# Patient Record
Sex: Female | Born: 1970 | Race: White | Hispanic: No | Marital: Married | Smoking: Never smoker
Health system: Southern US, Community
[De-identification: ages and names within clinical notes are randomized; demographics above are authoritative.]

## PROBLEM LIST (undated history)

## (undated) DIAGNOSIS — F419 Anxiety disorder, unspecified: Secondary | ICD-10-CM

## (undated) DIAGNOSIS — T8859XA Other complications of anesthesia, initial encounter: Secondary | ICD-10-CM

## (undated) DIAGNOSIS — R011 Cardiac murmur, unspecified: Secondary | ICD-10-CM

## (undated) DIAGNOSIS — Z9889 Other specified postprocedural states: Secondary | ICD-10-CM

## (undated) DIAGNOSIS — I739 Peripheral vascular disease, unspecified: Secondary | ICD-10-CM

## (undated) DIAGNOSIS — T4145XA Adverse effect of unspecified anesthetic, initial encounter: Secondary | ICD-10-CM

## (undated) DIAGNOSIS — M79676 Pain in unspecified toe(s): Secondary | ICD-10-CM

## (undated) DIAGNOSIS — Z87442 Personal history of urinary calculi: Secondary | ICD-10-CM

## (undated) DIAGNOSIS — E669 Obesity, unspecified: Secondary | ICD-10-CM

## (undated) DIAGNOSIS — Z9884 Bariatric surgery status: Secondary | ICD-10-CM

## (undated) DIAGNOSIS — N200 Calculus of kidney: Secondary | ICD-10-CM

## (undated) DIAGNOSIS — Z8719 Personal history of other diseases of the digestive system: Secondary | ICD-10-CM

## (undated) DIAGNOSIS — R112 Nausea with vomiting, unspecified: Secondary | ICD-10-CM

## (undated) HISTORY — PX: EYE SURGERY: SHX253

## (undated) HISTORY — PX: VASCULAR SURGERY: SHX849

## (undated) HISTORY — DX: Obesity, unspecified: E66.9

---

## 2007-10-09 HISTORY — PX: ACHILLES TENDON REPAIR: SUR1153

## 2009-03-10 HISTORY — PX: CHOLECYSTECTOMY: SHX55

## 2010-07-29 ENCOUNTER — Encounter (HOSPITAL_BASED_OUTPATIENT_CLINIC_OR_DEPARTMENT_OTHER): Attending: Internal Medicine

## 2010-07-29 DIAGNOSIS — S81009A Unspecified open wound, unspecified knee, initial encounter: Secondary | ICD-10-CM | POA: Insufficient documentation

## 2010-07-29 DIAGNOSIS — T310 Burns involving less than 10% of body surface: Secondary | ICD-10-CM | POA: Insufficient documentation

## 2010-07-29 DIAGNOSIS — Z7982 Long term (current) use of aspirin: Secondary | ICD-10-CM | POA: Insufficient documentation

## 2010-07-29 DIAGNOSIS — IMO0002 Reserved for concepts with insufficient information to code with codable children: Secondary | ICD-10-CM | POA: Insufficient documentation

## 2010-07-29 DIAGNOSIS — S91009A Unspecified open wound, unspecified ankle, initial encounter: Secondary | ICD-10-CM | POA: Insufficient documentation

## 2010-07-29 DIAGNOSIS — T24009A Burn of unspecified degree of unspecified site of unspecified lower limb, except ankle and foot, initial encounter: Secondary | ICD-10-CM | POA: Insufficient documentation
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
## 2010-08-09 ENCOUNTER — Encounter (HOSPITAL_BASED_OUTPATIENT_CLINIC_OR_DEPARTMENT_OTHER): Attending: Internal Medicine

## 2010-08-09 DIAGNOSIS — IMO0002 Reserved for concepts with insufficient information to code with codable children: Secondary | ICD-10-CM | POA: Insufficient documentation

## 2010-08-09 DIAGNOSIS — T310 Burns involving less than 10% of body surface: Secondary | ICD-10-CM | POA: Insufficient documentation

## 2010-08-09 DIAGNOSIS — T24339A Burn of third degree of unspecified lower leg, initial encounter: Secondary | ICD-10-CM | POA: Insufficient documentation

## 2010-08-09 DIAGNOSIS — Z7982 Long term (current) use of aspirin: Secondary | ICD-10-CM | POA: Insufficient documentation

## 2011-04-30 ENCOUNTER — Encounter (INDEPENDENT_AMBULATORY_CARE_PROVIDER_SITE_OTHER): Payer: Self-pay | Admitting: General Surgery

## 2011-05-01 ENCOUNTER — Encounter (INDEPENDENT_AMBULATORY_CARE_PROVIDER_SITE_OTHER): Payer: Self-pay | Admitting: Surgery

## 2011-05-01 ENCOUNTER — Ambulatory Visit (INDEPENDENT_AMBULATORY_CARE_PROVIDER_SITE_OTHER): Payer: 59 | Admitting: Surgery

## 2011-05-01 ENCOUNTER — Other Ambulatory Visit (INDEPENDENT_AMBULATORY_CARE_PROVIDER_SITE_OTHER): Payer: Self-pay | Admitting: General Surgery

## 2011-05-01 VITALS — BP 138/92 | HR 76 | Temp 98.6°F | Resp 16 | Ht 67.0 in | Wt 262.4 lb

## 2011-05-01 DIAGNOSIS — E669 Obesity, unspecified: Secondary | ICD-10-CM

## 2011-05-01 NOTE — Progress Notes (Signed)
Chief Complaint:  Morbid obesity BMI 41  History of Present Illness:  Darlene Patterson is an 41 y.o. female who works here at the [employer] and it comes today with her husband who is a critical care nurse at [employer] wanting to have a left adjustable gastric band placed. They have been to our seminar and have reviewed this and like the less invasive nature of the band and are aware that as a tool. We talked about dietary restrictions and maintaining these  She is scheduled to have a hysterectomy laparoscopically in March. She was hoping that she might be able to have a LAP-BAND in June. We'll begin the workup now.  History reviewed. No pertinent past medical history.  Past Surgical History  Procedure Date  . Cholecystectomy 2011  . Achilles tendon repair 08, 09    bilateral  . Cesarean section 89, 93    Current Outpatient Prescriptions  Medication Sig Dispense Refill  . aspirin 81 MG tablet Take 81 mg by mouth daily.      Marland Kitchen b complex vitamins tablet Take 1 tablet by mouth daily.      Marland Kitchen MAGNESIUM PO Take by mouth.      . Multiple Vitamin (MULITIVITAMIN WITH MINERALS) TABS Take 1 tablet by mouth daily.      . vitamin B-12 (CYANOCOBALAMIN) 1000 MCG tablet Take 1,000 mcg by mouth daily.       Review of patient's allergies indicates no known allergies. Family History  Problem Relation Age of Onset  . Hypertension Sister    Social History:   reports that she has never smoked. She does not have any smokeless tobacco history on file. She reports that she does not drink alcohol or use illicit drugs.   REVIEW OF SYSTEMS - PERTINENT POSITIVES ONLY: Is negative for DVT. She has had some spider veins injected. Same bilateral Achilles heel repairs. She has a negative family cancer history. Her social history is that she didn't drink or smoke. Her primary care physician is Dr. Jeanie Sewer.    Physical Exam:   Blood pressure 138/92, pulse 76, temperature 98.6 F (37 C),  temperature source Temporal, resp. rate 16, height 5\' 7"  (1.702 m), weight 262 lb 6.4 oz (119.024 kg). Body mass index is 41.10 kg/(m^2).  Gen:  WDWN white female NAD  Neurological: Alert and oriented to person, place, and time. Motor and sensory function is grossly intact  Head: Normocephalic and atraumatic.  Eyes: Conjunctivae are normal. Pupils are equal, round, and reactive to light. No scleral icterus.  Neck: Normal range of motion. Neck supple. No tracheal deviation or thyromegaly present.  Cardiovascular:  SR without murmurs or gallops.  No carotid bruits Respiratory: Effort normal.  No respiratory distress. No chest wall tenderness. Breath sounds normal.  No wheezes, rales or rhonchi.  Abdomen:  Obese nontender I went over location port placement and trocar placement. GU: Musculoskeletal: Normal range of motion. Extremities are nontender. No cyanosis, edema or clubbing noted Lymphadenopathy: No cervical, preauricular, postauricular or axillary adenopathy is present Skin: Skin is warm and dry. No rash noted. No diaphoresis. No erythema. No pallor. Pscyh: Normal mood and affect. Behavior is normal. Judgment and thought content normal.   LABORATORY RESULTS: No results found for this or any previous visit (from the past 48 hour(s)).  RADIOLOGY RESULTS: No results found.  Problem List: There is no problem list on file for this patient.   Assessment & Plan: Morbid obesity BMI 41 plan lap band.  We'll begin the workup    Matt B. Daphine Deutscher, MD, [facility] [phone] beeper [phone]  05/01/2011 11:56 AM

## 2011-05-01 NOTE — Progress Notes (Signed)
Addended by: Latricia Heft on: 05/01/2011 12:36 PM   Modules accepted: Orders

## 2011-05-09 HISTORY — PX: ABDOMINAL HYSTERECTOMY: SHX81

## 2011-05-16 ENCOUNTER — Other Ambulatory Visit (INDEPENDENT_AMBULATORY_CARE_PROVIDER_SITE_OTHER): Payer: Self-pay | Admitting: Surgery

## 2011-05-16 ENCOUNTER — Ambulatory Visit (HOSPITAL_COMMUNITY)
Admission: RE | Admit: 2011-05-16 | Discharge: 2011-05-16 | Disposition: A | Discharge status: Home / Self Care | Payer: 59 | Source: Ambulatory Visit | Attending: Surgery | Admitting: Surgery

## 2011-05-16 ENCOUNTER — Other Ambulatory Visit: Payer: Self-pay

## 2011-05-16 DIAGNOSIS — Z6841 Body Mass Index (BMI) 40.0 and over, adult: Secondary | ICD-10-CM | POA: Insufficient documentation

## 2011-05-16 DIAGNOSIS — K449 Diaphragmatic hernia without obstruction or gangrene: Secondary | ICD-10-CM | POA: Insufficient documentation

## 2011-05-16 DIAGNOSIS — Z01818 Encounter for other preprocedural examination: Secondary | ICD-10-CM | POA: Insufficient documentation

## 2011-05-16 DIAGNOSIS — E669 Obesity, unspecified: Secondary | ICD-10-CM

## 2011-05-17 LAB — LIPID PANEL
Cholesterol: 166 mg/dL (ref 0–200)
HDL: 47 mg/dL (ref >39)
Total CHOL/HDL Ratio: 3.5 Ratio

## 2011-05-17 LAB — CBC
HCT: 43.3 % (ref 36.0–46.0)
Hemoglobin: 13.8 g/dL (ref 12.0–15.0)
MCH: 28 pg (ref 26.0–34.0)
MCHC: 31.9 g/dL (ref 30.0–36.0)
RBC: 4.93 MIL/uL (ref 3.87–5.11)

## 2011-05-17 LAB — COMPREHENSIVE METABOLIC PANEL
ALT: 14 U/L (ref 0–35)
BUN: 10 mg/dL (ref 6–23)
CO2: 21 mEq/L (ref 19–32)
Calcium: 9.5 mg/dL (ref 8.4–10.5)
Chloride: 105 mEq/L (ref 96–112)
Creat: 0.68 mg/dL (ref 0.50–1.10)
Glucose, Bld: 107 mg/dL — ABNORMAL HIGH (ref 70–99)

## 2011-05-17 LAB — TSH: TSH: 2.132 u[IU]/mL (ref 0.350–4.500)

## 2011-05-19 ENCOUNTER — Encounter: Payer: 59 | Attending: Surgery | Admitting: *Deleted

## 2011-05-19 ENCOUNTER — Encounter: Payer: Self-pay | Admitting: *Deleted

## 2011-05-19 DIAGNOSIS — Z01818 Encounter for other preprocedural examination: Secondary | ICD-10-CM | POA: Insufficient documentation

## 2011-05-19 DIAGNOSIS — Z713 Dietary counseling and surveillance: Secondary | ICD-10-CM | POA: Insufficient documentation

## 2011-05-19 NOTE — Patient Instructions (Signed)
   Follow Pre-Op Nutrition Goals to prepare for Lapband Surgery.   Call the [facility] at [phone] once you have been given your surgery date to enrolled in the Pre-Op Nutrition Class. You will need to attend this nutrition class 3-4 weeks prior to your surgery. 

## 2011-05-20 NOTE — Progress Notes (Addendum)
  Pre-Op Assessment Visit:  Pre-Operative LAGB Surgery  Medical Nutrition Therapy:  Appt start time: 0915  end time:  1015.  Patient was seen on 05/19/11 for Pre-Operative LAGB Nutrition Assessment. Assessment and letter of approval faxed to [facility] B[facility]ric Surgery Program coordinator on 05/20/11.  Approval letter sent to [facility] center and will be available in the chart under the media tab.  TANITA  BODY COMP RESULTS  05/19/11     %Fat 51.0%     FM (lbs) 131.5     FFM (lbs) 126.5     TBW (lbs) 92.5      Handouts given during visit include:  Pre-Op Goals handout  Bariatric Protein Shakes handout  Bariatric Support Group calendar  Patient to call for Pre-Op and Post-Op Nutrition Education at the [facility] when surgery is scheduled.

## 2011-05-21 ENCOUNTER — Encounter (HOSPITAL_COMMUNITY)
Admission: RE | Disposition: A | Discharge status: Home Health | Payer: Self-pay | Source: Ambulatory Visit | Attending: Surgery

## 2011-05-21 ENCOUNTER — Ambulatory Visit (HOSPITAL_COMMUNITY)
Admission: RE | Admit: 2011-05-21 | Discharge: 2011-05-21 | Disposition: A | Discharge status: Home Health | Payer: 59 | Source: Ambulatory Visit | Attending: Surgery | Admitting: Surgery

## 2011-05-21 DIAGNOSIS — Z01818 Encounter for other preprocedural examination: Secondary | ICD-10-CM | POA: Insufficient documentation

## 2011-05-21 HISTORY — PX: BREATH TEK H PYLORI: SHX5422

## 2011-05-21 SURGERY — BREATH TEST, FOR HELICOBACTER PYLORI

## 2011-05-22 ENCOUNTER — Encounter (HOSPITAL_COMMUNITY): Payer: Self-pay | Admitting: Surgery

## 2011-09-25 ENCOUNTER — Encounter: Payer: 59 | Attending: Surgery | Admitting: *Deleted

## 2011-09-25 DIAGNOSIS — Z01818 Encounter for other preprocedural examination: Secondary | ICD-10-CM | POA: Insufficient documentation

## 2011-09-25 DIAGNOSIS — Z713 Dietary counseling and surveillance: Secondary | ICD-10-CM | POA: Insufficient documentation

## 2011-09-25 DIAGNOSIS — E669 Obesity, unspecified: Secondary | ICD-10-CM

## 2011-09-25 NOTE — Progress Notes (Signed)
Dr Daphine Deutscher,   Please add pre op orders to [facility]-   Has PST appt 10/01/11   Thanks

## 2011-09-27 NOTE — Progress Notes (Addendum)
  Bariatric Class:  Appt start time: 0830 end time:  0930.  Pre-Operative Nutrition Class  Patient was seen on 09/25/11 for Pre-Operative Bariatric Surgery Education at [facility].   Surgery date: 10/07/11 Surgery type: LAGB  Samples given per MNT protocol: Bariatric Advantage Multivitamin Lot # [id number] Exp: 09/13  Bariatric Advantage Calcium Citrate Lot # [id number] Exp: 10/13  Celebrate Vitamins Multivitamin Complete Lot # [id number] Exp: 11/14  Celebrate Vitamins Multivitamin Lot # [id number] Exp: 09/14  Celebrate VitaminsCalcium Citrate Lot # [id number] Exp: 10/14 The following the learning objective met by the patient during this course:   Identifies Pre-Op Dietary Goals and will begin 2 weeks pre-operatively   Identifies appropriate sources of fluids and proteins   States protein recommendations and appropriate sources pre and post-operatively  Identifies Post-Operative Dietary Goals and will follow for 2 weeks post-operatively  Identifies appropriate multivitamin and calcium sources  Describes the need for physical activity post-operatively and will follow MD recommendations  States when to call healthcare provider regarding medication questions or post-operative complications  Handouts given during class include:  Pre-Op Bariatric Surgery Diet Handout  Protein Shake Handout  Post-Op Bariatric Surgery Nutrition Handout  BELT Program Information Flyer  Support Group Information Flyer  Follow-Up Plan: Patient will follow-up at [facility] 2 weeks post operatively for diet advancement per MD.

## 2011-09-27 NOTE — Patient Instructions (Signed)
Follow:   Pre-Op Diet per MD 2 weeks prior to surgery  Phase 2- Liquids (clear/full) 2 weeks after surgery  Vitamin/Mineral/Calcium guidelines for purchasing bariatric supplements  Exercise guidelines pre and post-op per MD  Follow-up at [facility] in 2 weeks post-op for diet advancement. Contact Amous Crewe as needed with questions/concerns. 

## 2011-09-29 ENCOUNTER — Encounter (HOSPITAL_COMMUNITY): Payer: Self-pay | Admitting: Pharmacy Technician

## 2011-09-29 NOTE — Pre-Procedure Instructions (Signed)
Need surgery orders for pt pre op is 7-24 thanks

## 2011-09-30 ENCOUNTER — Other Ambulatory Visit (INDEPENDENT_AMBULATORY_CARE_PROVIDER_SITE_OTHER): Payer: Self-pay | Admitting: Surgery

## 2011-10-01 ENCOUNTER — Encounter (HOSPITAL_COMMUNITY): Payer: Self-pay

## 2011-10-01 ENCOUNTER — Other Ambulatory Visit (INDEPENDENT_AMBULATORY_CARE_PROVIDER_SITE_OTHER): Payer: Self-pay | Admitting: Surgery

## 2011-10-01 ENCOUNTER — Encounter (HOSPITAL_COMMUNITY)
Admission: RE | Admit: 2011-10-01 | Discharge: 2011-10-01 | Disposition: A | Discharge status: Home / Self Care | Payer: 59 | Source: Ambulatory Visit | Attending: Surgery | Admitting: Surgery

## 2011-10-01 HISTORY — DX: Anxiety disorder, unspecified: F41.9

## 2011-10-01 HISTORY — DX: Adverse effect of unspecified anesthetic, initial encounter: T41.45XA

## 2011-10-01 HISTORY — DX: Nausea with vomiting, unspecified: Z98.890

## 2011-10-01 HISTORY — DX: Cardiac murmur, unspecified: R01.1

## 2011-10-01 HISTORY — DX: Other complications of anesthesia, initial encounter: T88.59XA

## 2011-10-01 HISTORY — DX: Peripheral vascular disease, unspecified: I73.9

## 2011-10-01 HISTORY — DX: Personal history of other diseases of the digestive system: Z87.19

## 2011-10-01 HISTORY — DX: Other specified postprocedural states: R11.2

## 2011-10-01 LAB — COMPREHENSIVE METABOLIC PANEL
AST: 19 U/L (ref 0–37)
Albumin: 3.8 g/dL (ref 3.5–5.2)
Calcium: 9.9 mg/dL (ref 8.4–10.5)
Creatinine, Ser: 0.65 mg/dL (ref 0.50–1.10)
GFR calc non Af Amer: >90 mL/min (ref >90)

## 2011-10-01 LAB — CBC
HCT: 41.3 % (ref 36.0–46.0)
Hemoglobin: 13.9 g/dL (ref 12.0–15.0)
RDW: 13.2 % (ref 11.5–15.5)
WBC: 9.5 10*3/uL (ref 4.0–10.5)

## 2011-10-01 LAB — SURGICAL PCR SCREEN
MRSA, PCR: NEGATIVE
Staphylococcus aureus: POSITIVE — AB

## 2011-10-01 NOTE — Patient Instructions (Signed)
20 Darlene Patterson  10/01/2011   Your procedure is scheduled on:  10/07/11   Tuesday  Surgery  [id number]  Report to [facility] at   0805    AM.  Call this number if you have problems the morning of surgery: [phone]     Or PST   [phone]  [facility]   Remember:   Do not eat food: or fluids After Midnight. Monday NIGHT     Take these medicines the morning of surgery with A SIP OF WATER: none   Do not wear jewelry, make-up or nail polish.  Do not wear lotions, powders, or perfumes. You may wear deodorant.  Do not shave 48 hours prior to surgery.  Do not bring valuables to the hospital.  Contacts, dentures or bridgework may not be worn into surgery.  Leave suitcase in the car. After surgery it may be brought to your room.  For patients admitted to the hospital, checkout time is 11:00 AM the day of discharge.   Patients discharged the day of surgery will not be allowed to drive home.  Name and phone number of your driver:                                                                      Special Instructions: CHG Shower Use Special Wash: 1/2 bottle night before surgery and 1/2 bottle morning of surgery. REGULAR SOAP FACE AND PRIVATES              LADIES- NO SHAVING 48 HOURS BEFORE USING BETASEPT SOAP.                 Please read over the following fact sheets that you were given: MRSA Information

## 2011-10-01 NOTE — Pre-Procedure Instructions (Signed)
ANESTHESIA RECORD FROM 3/13 [facility] H[facility]ITAL PLACED ON CHART

## 2011-10-01 NOTE — Pre-Procedure Instructions (Signed)
EKG,CHEST 3/13  EPIC,  Requested anesthesia records from [facility] from 3/13- HYSTERECTOMY

## 2011-10-02 ENCOUNTER — Encounter (INDEPENDENT_AMBULATORY_CARE_PROVIDER_SITE_OTHER): Payer: Self-pay | Admitting: Surgery

## 2011-10-02 ENCOUNTER — Ambulatory Visit (INDEPENDENT_AMBULATORY_CARE_PROVIDER_SITE_OTHER): Payer: 59 | Admitting: Surgery

## 2011-10-02 VITALS — BP 110/82 | HR 62 | Temp 97.3°F | Resp 16 | Ht 67.0 in | Wt 259.0 lb

## 2011-10-02 DIAGNOSIS — E669 Obesity, unspecified: Secondary | ICD-10-CM

## 2011-10-02 NOTE — Patient Instructions (Addendum)

## 2011-10-02 NOTE — Progress Notes (Signed)
Chief Complaint:  Morbid obesity Wt 259  History of Present Illness:  Darlene Patterson is an 41 y.o. female last seen by me in February for Lapband.  That is scheduled for this next Tuesday. She denies having any symptoms of GERD but on upper GI series she was felt to have a small hiatus hernia.  Repeat questioning she maybe has occasional heartburn after eating but this is very rare. We will look at this will redo her lap band. She is well school on [employer] is rated at this next week. She denies any change in her physical exam or medical history since I saw her back in February.  She does complain of severe postoperative nausea and vomiting despite patches and IV medicines. I've asked her to speak with the anesthesiologist in the holding area so that we can do all that we can to try to help prevent that. She said that occur after her hysterectomy back in 2011 which was done down in [facility].  Past Medical History  Diagnosis Date  . Obesity   . Complication of anesthesia   . PONV (postoperative nausea and vomiting)     'SEVERE PER PT"  . Heart murmur     early teens- resolved  . Anxiety     with anesthesia  . H/O hiatal hernia     per upper GI 3/13  . Peripheral vascular disease     varicose veins- left    Past Surgical History  Procedure Date  . Cholecystectomy 2011  . Achilles tendon repair 08, 09    bilateral  . Cesarean section 89, 93  . Breath tek h pylori 05/21/2011    Procedure: BREATH TEK H PYLORI;  Surgeon: Valarie Merino, MD;  Location: [facility];  Service: General;  Laterality: N/A;  . Abdominal hysterectomy 3/13  . Eye surgery 73,85    repair lazy eye  . Vascular surgery     left leg- s/p  injections of veins    Current Outpatient Prescriptions  Medication Sig Dispense Refill  . aspirin 81 MG tablet Take 81 mg by mouth daily.      Marland Kitchen b complex vitamins tablet Take 1 tablet by mouth daily.      Marland Kitchen MAGNESIUM PO Take 800 mg by mouth daily.       . Multiple  Vitamins-Minerals (MULTIVITAMIN GUMMIES ADULT) CHEW Chew 1 tablet by mouth daily.       Review of patient's allergies indicates no known allergies. Family History  Problem Relation Age of Onset  . Hypertension Sister   . COPD Mother    Social History:   reports that she has never smoked. She has never used smokeless tobacco. She reports that she drinks alcohol. She reports that she does not use illicit drugs.   REVIEW OF SYSTEMS - PERTINENT POSITIVES ONLY: noncontributory  Physical Exam:   Blood pressure 110/82, pulse 62, temperature 97.3 F (36.3 C), temperature source Temporal, resp. rate 16, height 5\' 7"  (1.702 m), weight 259 lb (117.482 kg), last menstrual period 06/01/2011. Body mass index is 40.57 kg/(m^2).  Gen:  WDWN WF NAD  Neurological: Alert and oriented to person, place, and time. Motor and sensory function is grossly intact  Head: Normocephalic and atraumatic.  Eyes: Conjunctivae are normal. Pupils are equal, round, and reactive to light. No scleral icterus.  Neck: Normal range of motion. Neck supple. No tracheal deviation or thyromegaly present.  Cardiovascular:  SR without murmurs or gallops.  No carotid bruits Respiratory:  Effort normal.  No respiratory distress. No chest wall tenderness. Breath sounds normal.  No wheezes, rales or rhonchi.  Abdomen:  nontender GU: Musculoskeletal: Normal range of motion. Extremities are nontender. No cyanosis, edema or clubbing noted Lymphadenopathy: No cervical, preauricular, postauricular or axillary adenopathy is present Skin: Skin is warm and dry. No rash noted. No diaphoresis. No erythema. No pallor. Pscyh: Normal mood and affect. Behavior is normal. Judgment and thought content normal.   LABORATORY RESULTS: Results for orders placed during the hospital encounter of 10/01/11 (from the past 48 hour(s))  SURGICAL PCR SCREEN     Status: Abnormal   Collection Time   10/01/11 11:19 AM      Component Value Range Comment   MRSA,  PCR NEGATIVE  NEGATIVE    Staphylococcus aureus POSITIVE (*) NEGATIVE   COMPREHENSIVE METABOLIC PANEL     Status: Normal   Collection Time   10/01/11 12:15 PM      Component Value Range Comment   Sodium 139  135 - 145 mEq/L    Potassium 3.9  3.5 - 5.1 mEq/L    Chloride 102  96 - 112 mEq/L    CO2 27  19 - 32 mEq/L    Glucose, Bld 98  70 - 99 mg/dL    BUN 16  6 - 23 mg/dL    Creatinine, Ser 2.95  0.50 - 1.10 mg/dL    Calcium 9.9  8.4 - 62.1 mg/dL    Total Protein 7.7  6.0 - 8.3 g/dL    Albumin 3.8  3.5 - 5.2 g/dL    AST 19  0 - 37 U/L    ALT 27  0 - 35 U/L    Alkaline Phosphatase 84  39 - 117 U/L    Total Bilirubin 0.3  0.3 - 1.2 mg/dL    GFR calc non Af Amer >90  >90 mL/min    GFR calc Af Amer >90  >90 mL/min   CBC     Status: Abnormal   Collection Time   10/01/11 12:15 PM      Component Value Range Comment   WBC 9.5  4.0 - 10.5 K/uL    RBC 4.95  3.87 - 5.11 MIL/uL    Hemoglobin 13.9  12.0 - 15.0 g/dL    HCT 30.8  65.7 - 84.6 %    MCV 83.4  78.0 - 100.0 fL    MCH 28.1  26.0 - 34.0 pg    MCHC 33.7  30.0 - 36.0 g/dL    RDW 96.2  95.2 - 84.1 %    Platelets 404 (*) 150 - 400 K/uL     RADIOLOGY RESULTS: No results found.  Problem List: Patient Active Problem List  Diagnosis  . Obesity BMI 41    Assessment & Plan: For Lapband placement next week    Matt B. Daphine Deutscher, MD, [facility] [phone] beeper [phone]  10/02/2011 12:25 PM

## 2011-10-07 ENCOUNTER — Ambulatory Visit (HOSPITAL_COMMUNITY): Payer: 59 | Admitting: Anesthesiology

## 2011-10-07 ENCOUNTER — Encounter (HOSPITAL_COMMUNITY): Payer: Self-pay | Admitting: Anesthesiology

## 2011-10-07 ENCOUNTER — Encounter (HOSPITAL_COMMUNITY): Payer: Self-pay | Admitting: *Deleted

## 2011-10-07 ENCOUNTER — Inpatient Hospital Stay (HOSPITAL_COMMUNITY)
Admission: RE | Admit: 2011-10-07 | Discharge: 2011-10-08 | DRG: 621 | Disposition: A | Discharge status: Home / Self Care | Payer: 59 | Source: Ambulatory Visit | Attending: Surgery | Admitting: Surgery

## 2011-10-07 ENCOUNTER — Encounter (HOSPITAL_COMMUNITY)
Admission: RE | Disposition: A | Discharge status: Home / Self Care | Payer: Self-pay | Source: Ambulatory Visit | Attending: Surgery

## 2011-10-07 DIAGNOSIS — Z9884 Bariatric surgery status: Secondary | ICD-10-CM

## 2011-10-07 DIAGNOSIS — K449 Diaphragmatic hernia without obstruction or gangrene: Secondary | ICD-10-CM | POA: Diagnosis present

## 2011-10-07 DIAGNOSIS — Z6841 Body Mass Index (BMI) 40.0 and over, adult: Secondary | ICD-10-CM

## 2011-10-07 DIAGNOSIS — I8390 Asymptomatic varicose veins of unspecified lower extremity: Secondary | ICD-10-CM | POA: Diagnosis present

## 2011-10-07 DIAGNOSIS — Z01812 Encounter for preprocedural laboratory examination: Secondary | ICD-10-CM

## 2011-10-07 DIAGNOSIS — E669 Obesity, unspecified: Secondary | ICD-10-CM

## 2011-10-07 DIAGNOSIS — Z7982 Long term (current) use of aspirin: Secondary | ICD-10-CM

## 2011-10-07 HISTORY — PX: HIATAL HERNIA REPAIR: SHX195

## 2011-10-07 HISTORY — PX: LAPAROSCOPIC GASTRIC BANDING: SHX1100

## 2011-10-07 LAB — CREATININE, SERUM
Creatinine, Ser: 0.6 mg/dL (ref 0.50–1.10)
GFR calc non Af Amer: >90 mL/min (ref >90)

## 2011-10-07 LAB — CBC
MCH: 28.5 pg (ref 26.0–34.0)
MCHC: 33.7 g/dL (ref 30.0–36.0)
RDW: 13.3 % (ref 11.5–15.5)

## 2011-10-07 SURGERY — GASTRIC BANDING, LAPAROSCOPIC
Anesthesia: General | Site: Esophagus | Wound class: Clean

## 2011-10-07 MED ORDER — PROPOFOL 10 MG/ML IV EMUL
INTRAVENOUS | Status: DC | PRN
  Administered 2011-10-07: 250 ug/kg/min via INTRAVENOUS

## 2011-10-07 MED ORDER — LACTATED RINGERS IV SOLN
INTRAVENOUS | Status: DC | PRN
  Administered 2011-10-07: 1000 mL via INTRAUTERINE

## 2011-10-07 MED ORDER — MIDAZOLAM HCL 5 MG/5ML IJ SOLN
INTRAMUSCULAR | Status: DC | PRN
  Administered 2011-10-07: 2 mg via INTRAVENOUS

## 2011-10-07 MED ORDER — BUPIVACAINE-EPINEPHRINE 0.5% -1:200000 IJ SOLN
INTRAMUSCULAR | Status: AC
  Filled 2011-10-07: qty 1

## 2011-10-07 MED ORDER — SCOPOLAMINE 1 MG/3DAYS TD PT72
MEDICATED_PATCH | TRANSDERMAL | Status: DC | PRN
  Administered 2011-10-07: 1.5 mg via TRANSDERMAL

## 2011-10-07 MED ORDER — SCOPOLAMINE 1 MG/3DAYS TD PT72
MEDICATED_PATCH | TRANSDERMAL | Status: AC
  Filled 2011-10-07: qty 1

## 2011-10-07 MED ORDER — HEPARIN SODIUM (PORCINE) 5000 UNIT/ML IJ SOLN
5000.0000 [IU] | INTRAMUSCULAR | Status: AC
  Administered 2011-10-07: 5000 [IU] via SUBCUTANEOUS
  Filled 2011-10-07: qty 1

## 2011-10-07 MED ORDER — GLYCOPYRROLATE 0.2 MG/ML IJ SOLN
INTRAMUSCULAR | Status: DC | PRN
  Administered 2011-10-07: 0.6 mg via INTRAVENOUS

## 2011-10-07 MED ORDER — SODIUM CHLORIDE 0.9 % IJ SOLN
INTRAMUSCULAR | Status: DC | PRN
  Administered 2011-10-07: 20 mL

## 2011-10-07 MED ORDER — KCL IN DEXTROSE-NACL 20-5-0.45 MEQ/L-%-% IV SOLN
INTRAVENOUS | Status: DC
  Administered 2011-10-07 – 2011-10-08 (×2): via INTRAVENOUS
  Filled 2011-10-07 (×4): qty 1000

## 2011-10-07 MED ORDER — MORPHINE SULFATE 2 MG/ML IJ SOLN
2.0000 mg | INTRAMUSCULAR | Status: DC | PRN
Start: 2011-10-07 — End: 2011-10-08
  Administered 2011-10-07: 2 mg via INTRAVENOUS
  Filled 2011-10-07: qty 1

## 2011-10-07 MED ORDER — FENTANYL CITRATE 0.05 MG/ML IJ SOLN
INTRAMUSCULAR | Status: DC | PRN
  Administered 2011-10-07 (×3): 100 ug via INTRAVENOUS
  Administered 2011-10-07: 50 ug via INTRAVENOUS
  Administered 2011-10-07: 150 ug via INTRAVENOUS
  Administered 2011-10-07: 100 ug via INTRAVENOUS

## 2011-10-07 MED ORDER — OXYCODONE-ACETAMINOPHEN 5-325 MG/5ML PO SOLN: 5.0000 mL | ORAL | Status: DC | PRN

## 2011-10-07 MED ORDER — UNJURY CHOCOLATE CLASSIC POWDER: 2.0000 [oz_av] | Freq: Four times a day (QID) | ORAL | Status: DC

## 2011-10-07 MED ORDER — BUPIVACAINE LIPOSOME 1.3 % IJ SUSP
20.0000 mL | Freq: Once | INTRAMUSCULAR | Status: AC
  Administered 2011-10-07: 20 mL
  Filled 2011-10-07: qty 20

## 2011-10-07 MED ORDER — NEOSTIGMINE METHYLSULFATE 1 MG/ML IJ SOLN
INTRAMUSCULAR | Status: DC | PRN
  Administered 2011-10-07: 4 mg via INTRAVENOUS

## 2011-10-07 MED ORDER — DEXAMETHASONE SODIUM PHOSPHATE 10 MG/ML IJ SOLN
INTRAMUSCULAR | Status: DC | PRN
  Administered 2011-10-07: 10 mg via INTRAVENOUS

## 2011-10-07 MED ORDER — ACETAMINOPHEN 160 MG/5ML PO SOLN
650.0000 mg | ORAL | Status: DC | PRN
  Administered 2011-10-08: 650 mg via ORAL
  Filled 2011-10-07: qty 20.3

## 2011-10-07 MED ORDER — LACTATED RINGERS IV SOLN
INTRAVENOUS | Status: DC
  Administered 2011-10-07: 1000 mL via INTRAVENOUS
  Administered 2011-10-07: 11:00:00 via INTRAVENOUS

## 2011-10-07 MED ORDER — PROPOFOL 10 MG/ML IV BOLUS
INTRAVENOUS | Status: DC | PRN
  Administered 2011-10-07: 200 mg via INTRAVENOUS

## 2011-10-07 MED ORDER — ROCURONIUM BROMIDE 100 MG/10ML IV SOLN
INTRAVENOUS | Status: DC | PRN
  Administered 2011-10-07 (×3): 10 mg via INTRAVENOUS
  Administered 2011-10-07: 50 mg via INTRAVENOUS

## 2011-10-07 MED ORDER — LACTATED RINGERS IV SOLN: INTRAVENOUS | Status: DC

## 2011-10-07 MED ORDER — LIDOCAINE HCL (CARDIAC) 20 MG/ML IV SOLN
INTRAVENOUS | Status: DC | PRN
  Administered 2011-10-07: 50 mg via INTRAVENOUS

## 2011-10-07 MED ORDER — UNJURY CHICKEN SOUP POWDER
2.0000 [oz_av] | Freq: Four times a day (QID) | ORAL | Status: DC
  Administered 2011-10-08: 2 [oz_av] via ORAL

## 2011-10-07 MED ORDER — ONDANSETRON HCL 4 MG/2ML IJ SOLN
INTRAMUSCULAR | Status: DC | PRN
  Administered 2011-10-07: 4 mg via INTRAVENOUS

## 2011-10-07 MED ORDER — HYDROMORPHONE HCL PF 1 MG/ML IJ SOLN
INTRAMUSCULAR | Status: AC
  Filled 2011-10-07: qty 1

## 2011-10-07 MED ORDER — DROPERIDOL 2.5 MG/ML IJ SOLN
INTRAMUSCULAR | Status: DC | PRN
  Administered 2011-10-07: 0.625 mg via INTRAVENOUS

## 2011-10-07 MED ORDER — HYDROMORPHONE HCL PF 1 MG/ML IJ SOLN
0.2500 mg | INTRAMUSCULAR | Status: DC | PRN
  Administered 2011-10-07 (×2): 0.5 mg via INTRAVENOUS

## 2011-10-07 MED ORDER — ACETAMINOPHEN 10 MG/ML IV SOLN
INTRAVENOUS | Status: DC | PRN
  Administered 2011-10-07: 1000 mg via INTRAVENOUS

## 2011-10-07 MED ORDER — UNJURY VANILLA POWDER: 2.0000 [oz_av] | Freq: Four times a day (QID) | ORAL | Status: DC

## 2011-10-07 MED ORDER — HEPARIN SODIUM (PORCINE) 5000 UNIT/ML IJ SOLN
5000.0000 [IU] | Freq: Three times a day (TID) | INTRAMUSCULAR | Status: DC
  Administered 2011-10-07 – 2011-10-08 (×3): 5000 [IU] via SUBCUTANEOUS
  Filled 2011-10-07 (×6): qty 1

## 2011-10-07 MED ORDER — CEFOXITIN SODIUM-DEXTROSE 1-4 GM-% IV SOLR (PREMIX)
INTRAVENOUS | Status: AC
  Filled 2011-10-07: qty 100

## 2011-10-07 MED ORDER — DEXTROSE 5 % IV SOLN
2.0000 g | INTRAVENOUS | Status: AC
  Administered 2011-10-07: 2 g via INTRAVENOUS
  Filled 2011-10-07: qty 2

## 2011-10-07 MED ORDER — ONDANSETRON HCL 4 MG/2ML IJ SOLN: 4.0000 mg | INTRAMUSCULAR | Status: DC | PRN

## 2011-10-07 MED ORDER — ACETAMINOPHEN 10 MG/ML IV SOLN
INTRAVENOUS | Status: AC
  Filled 2011-10-07: qty 100

## 2011-10-07 SURGICAL SUPPLY — 60 items
BAND LAP 10.0 W/TUBES (Band) ×3 IMPLANT
BENZOIN TINCTURE PRP APPL 2/3 (GAUZE/BANDAGES/DRESSINGS) IMPLANT
BLADE HEX COATED 2.75 (ELECTRODE) ×3 IMPLANT
BLADE SURG 15 STRL LF DISP TIS (BLADE) ×2 IMPLANT
BLADE SURG 15 STRL SS (BLADE) ×1
CANISTER SUCTION 2500CC (MISCELLANEOUS) ×3 IMPLANT
CLOTH BEACON ORANGE TIMEOUT ST (SAFETY) ×3 IMPLANT
COVER SURGICAL LIGHT HANDLE (MISCELLANEOUS) ×3 IMPLANT
DECANTER SPIKE VIAL GLASS SM (MISCELLANEOUS) ×6 IMPLANT
DEVICE SUT QUICK LOAD TK 5 (STAPLE) ×9 IMPLANT
DEVICE SUT TI-KNOT TK 5X26 (MISCELLANEOUS) ×3 IMPLANT
DEVICE SUTURE ENDOST 10MM (ENDOMECHANICALS) ×3 IMPLANT
DISSECTOR BLUNT TIP ENDO 5MM (MISCELLANEOUS) IMPLANT
DRAPE CAMERA CLOSED 9X96 (DRAPES) ×3 IMPLANT
ELECT REM PT RETURN 9FT ADLT (ELECTROSURGICAL) ×3
ELECTRODE REM PT RTRN 9FT ADLT (ELECTROSURGICAL) ×2 IMPLANT
GLOVE BIOGEL M 8.0 STRL (GLOVE) ×3 IMPLANT
GLOVE BIOGEL PI IND STRL 7.0 (GLOVE) ×2 IMPLANT
GLOVE BIOGEL PI INDICATOR 7.0 (GLOVE) ×1
GOWN STRL NON-REIN LRG LVL3 (GOWN DISPOSABLE) ×3 IMPLANT
GOWN STRL REIN XL XLG (GOWN DISPOSABLE) ×6 IMPLANT
HOVERMATT SINGLE USE (MISCELLANEOUS) ×3 IMPLANT
KIT BASIN OR (CUSTOM PROCEDURE TRAY) ×3 IMPLANT
MESH HERNIA 1X4 RECT BARD (Mesh General) ×2 IMPLANT
MESH HERNIA BARD 1X4 (Mesh General) ×1 IMPLANT
NEEDLE SPNL 22GX3.5 QUINCKE BK (NEEDLE) ×3 IMPLANT
NS IRRIG 1000ML POUR BTL (IV SOLUTION) ×3 IMPLANT
PACK UNIVERSAL I (CUSTOM PROCEDURE TRAY) ×3 IMPLANT
PENCIL BUTTON HOLSTER BLD 10FT (ELECTRODE) ×3 IMPLANT
SCALPEL HARMONIC ACE (MISCELLANEOUS) IMPLANT
SET IRRIG TUBING LAPAROSCOPIC (IRRIGATION / IRRIGATOR) ×3 IMPLANT
SHEARS CURVED HARMONIC AC 45CM (MISCELLANEOUS) ×3 IMPLANT
SLEEVE ADV FIXATION 5X100MM (TROCAR) IMPLANT
SLEEVE Z-THREAD 5X100MM (TROCAR) IMPLANT
SOLUTION ANTI FOG 6CC (MISCELLANEOUS) ×3 IMPLANT
SPONGE GAUZE 4X4 12PLY (GAUZE/BANDAGES/DRESSINGS) ×3 IMPLANT
SPONGE LAP 18X18 X RAY DECT (DISPOSABLE) ×3 IMPLANT
STAPLER VISISTAT 35W (STAPLE) ×3 IMPLANT
STRIP CLOSURE SKIN 1/2X4 (GAUZE/BANDAGES/DRESSINGS) IMPLANT
SUT ETHIBOND 2 0 SH (SUTURE) ×3
SUT ETHIBOND 2 0 SH 36X2 (SUTURE) ×6 IMPLANT
SUT PROLENE 2 0 CT2 30 (SUTURE) ×3 IMPLANT
SUT SILK 0 (SUTURE) ×1
SUT SILK 0 30XBRD TIE 6 (SUTURE) ×2 IMPLANT
SUT SURGIDAC NAB ES-9 0 48 120 (SUTURE) ×6 IMPLANT
SUT VIC AB 2-0 SH 27 (SUTURE)
SUT VIC AB 2-0 SH 27X BRD (SUTURE) IMPLANT
SUT VIC AB 4-0 SH 18 (SUTURE) ×3 IMPLANT
SYR 20CC LL (SYRINGE) ×3 IMPLANT
SYR 30ML LL (SYRINGE) ×3 IMPLANT
SYS KII OPTICAL ACCESS 15MM (TROCAR) ×3
SYSTEM KII OPTICAL ACCESS 15MM (TROCAR) ×2 IMPLANT
TOWEL OR 17X26 10 PK STRL BLUE (TOWEL DISPOSABLE) ×6 IMPLANT
TROCAR ADV FIXATION 11X100MM (TROCAR) IMPLANT
TROCAR XCEL NON-BLD 11X100MML (ENDOMECHANICALS) ×3 IMPLANT
TROCAR Z-THREAD FIOS 11X100 BL (TROCAR) ×3 IMPLANT
TROCAR Z-THREAD FIOS 5X100MM (TROCAR) ×6 IMPLANT
TROCAR Z-THREAD SLEEVE 11X100 (TROCAR) ×3 IMPLANT
TUBE CALIBRATION LAPBAND (TUBING) ×3 IMPLANT
TUBING INSUFFLATION 10FT LAP (TUBING) ×15 IMPLANT

## 2011-10-07 NOTE — Anesthesia Preprocedure Evaluation (Addendum)
Anesthesia Evaluation  Patient identified by MRN, date of birth, ID band Patient awake    Reviewed: Allergy & Precautions, H&P , NPO status , Patient's Chart, lab work & pertinent test results  History of Anesthesia Complications (+) PONV  Airway Mallampati: II TM Distance: >3 FB Neck ROM: full    Dental No notable dental hx. (+) Teeth Intact and Dental Advisory Given   Pulmonary neg pulmonary ROS,  breath sounds clear to auscultation  Pulmonary exam normal       Cardiovascular Exercise Tolerance: Good negative cardio ROS  Rhythm:regular Rate:Normal     Neuro/Psych negative neurological ROS  negative psych ROS   GI/Hepatic negative GI ROS, Neg liver ROS, hiatal hernia,   Endo/Other  negative endocrine ROSMorbid obesity  Renal/GU negative Renal ROS  negative genitourinary   Musculoskeletal   Abdominal   Peds  Hematology negative hematology ROS (+)   Anesthesia Other Findings   Reproductive/Obstetrics negative OB ROS                           Anesthesia Physical Anesthesia Plan  ASA: II  Anesthesia Plan: General   Post-op Pain Management:    Induction: Intravenous  Airway Management Planned: Oral ETT  Additional Equipment:   Intra-op Plan:   Post-operative Plan: Extubation in OR  Informed Consent: I have reviewed the patients History and Physical, chart, labs and discussed the procedure including the risks, benefits and alternatives for the proposed anesthesia with the patient or authorized representative who has indicated his/her understanding and acceptance.   Dental Advisory Given  Plan Discussed with: CRNA and Surgeon  Anesthesia Plan Comments:         Anesthesia Quick Evaluation

## 2011-10-07 NOTE — H&P (Addendum)
Chief Complaint: Morbid obesity Wt 259  History of Present Illness: Darlene Patterson is an 41 y.o. female last seen by me in February for Lapband. That is scheduled for this next Tuesday. She denies having any symptoms of GERD but on upper GI series she was felt to have a small hiatus hernia. Repeat questioning she maybe has occasional heartburn after eating but this is very rare. We will look at this as we do her lapband. She is well schooled on [employer] and  is ready for  this next week. She denies any change in her physical exam or medical history since I saw her back in February.  She does complain of severe postoperative nausea and vomiting despite patches and IV medicines. I've asked her to speak with the anesthesiologist in the holding area so that we can do all that we can to try to help prevent that. She said that occur after her hysterectomy back in 2011 which was done down in [facility].  Past Medical History   Diagnosis  Date   .  Obesity    .  Complication of anesthesia    .  PONV (postoperative nausea and vomiting)      'SEVERE PER PT"   .  Heart murmur      early teens- resolved   .  Anxiety      with anesthesia   .  H/O hiatal hernia      per upper GI 3/13   .  Peripheral vascular disease      varicose veins- left    Past Surgical History   Procedure  Date   .  Cholecystectomy  2011   .  Achilles tendon repair  08, 09     bilateral   .  Cesarean section  87, 89   .  Breath tek h pylori  05/21/2011     Procedure: BREATH TEK H PYLORI; Surgeon: Valarie Merino, MD; Location: [facility]; Service: General; Laterality: N/A;   .  Abdominal hysterectomy  3/13   .  Eye surgery  [age]     repair lazy eye   .  Vascular surgery      left leg- s/p injections of veins    Current Outpatient Prescriptions   Medication  Sig  Dispense  Refill   .  aspirin 81 MG tablet  Take 81 mg by mouth daily.     Marland Kitchen  b complex vitamins tablet  Take 1 tablet by mouth daily.     Marland Kitchen  MAGNESIUM  PO  Take 800 mg by mouth daily.     .  Multiple Vitamins-Minerals (MULTIVITAMIN GUMMIES ADULT) CHEW  Chew 1 tablet by mouth daily.      Review of patient's allergies indicates no known allergies.  Family History   Problem  Relation  Age of Onset   .  Hypertension  Sister    .  COPD  Mother     Social History: reports that she has never smoked. She has never used smokeless tobacco. She reports that she drinks alcohol. She reports that she does not use illicit drugs.  REVIEW OF SYSTEMS - PERTINENT POSITIVES ONLY:  noncontributory  Physical Exam:  Blood pressure 110/82, pulse 62, temperature 97.3 F (36.3 C), temperature source Temporal, resp. rate 16, height 5\' 7"  (1.702 m), weight 259 lb (117.482 kg), last menstrual period 06/01/2011.  Body mass index is 40.57 kg/(m^2).  Gen: WDWN WF NAD  Neurological: Alert and oriented to person, place,  and time. Motor and sensory function is grossly intact  Head: Normocephalic and atraumatic.  Eyes: Conjunctivae are normal. Pupils are equal, round, and reactive to light. No scleral icterus.  Neck: Normal range of motion. Neck supple. No tracheal deviation or thyromegaly present.  Cardiovascular: SR without murmurs or gallops. No carotid bruits  Respiratory: Effort normal. No respiratory distress. No chest wall tenderness. Breath sounds normal. No wheezes, rales or rhonchi.  Abdomen: nontender  GU:  Musculoskeletal: Normal range of motion. Extremities are nontender. No cyanosis, edema or clubbing noted Lymphadenopathy: No cervical, preauricular, postauricular or axillary adenopathy is present Skin: Skin is warm and dry. No rash noted. No diaphoresis. No erythema. No pallor. Pscyh: Normal mood and affect. Behavior is normal. Judgment and thought content normal.  LABORATORY RESULTS:  Results for orders placed during the hospital encounter of 10/01/11 (from the past 48 hour(s))   SURGICAL PCR SCREEN Status: Abnormal    Collection Time    10/01/11 11:19  AM   Component  Value  Range  Comment    MRSA, PCR  NEGATIVE  NEGATIVE     Staphylococcus aureus  POSITIVE (*)  NEGATIVE    COMPREHENSIVE METABOLIC PANEL Status: Normal    Collection Time    10/01/11 12:15 PM   Component  Value  Range  Comment    Sodium  139  135 - 145 mEq/L     Potassium  3.9  3.5 - 5.1 mEq/L     Chloride  102  96 - 112 mEq/L     CO2  27  19 - 32 mEq/L     Glucose, Bld  98  70 - 99 mg/dL     BUN  16  6 - 23 mg/dL     Creatinine, Ser  1.61  0.50 - 1.10 mg/dL     Calcium  9.9  8.4 - 10.5 mg/dL     Total Protein  7.7  6.0 - 8.3 g/dL     Albumin  3.8  3.5 - 5.2 g/dL     AST  19  0 - 37 U/L     ALT  27  0 - 35 U/L     Alkaline Phosphatase  84  39 - 117 U/L     Total Bilirubin  0.3  0.3 - 1.2 mg/dL     GFR calc non Af Amer  >90  >90 mL/min     GFR calc Af Amer  >90  >90 mL/min    CBC Status: Abnormal    Collection Time    10/01/11 12:15 PM   Component  Value  Range  Comment    WBC  9.5  4.0 - 10.5 K/uL     RBC  4.95  3.87 - 5.11 MIL/uL     Hemoglobin  13.9  12.0 - 15.0 g/dL     HCT  09.6  04.5 - 40.9 %     MCV  83.4  78.0 - 100.0 fL     MCH  28.1  26.0 - 34.0 pg     MCHC  33.7  30.0 - 36.0 g/dL     RDW  81.1  91.4 - 78.2 %     Platelets  404 (*)  150 - 400 K/uL     RADIOLOGY RESULTS:  No results found.  Problem List:  Patient Active Problem List   Diagnosis   .  Obesity BMI 41    Assessment & Plan:  For Lapband placement  Matt B.  Daphine Deutscher, MD, [facility]  [phone] beeper  [phone]  There has been no change in the patient's past medical history or physical exam in the past 24 hours to the best of my knowledge. I examined the patient in the holding area and have made any changes to the history and physical exam report that is included above.   Expectations and outcome results have been discussed with the patient to include risks and benefits.  All questions have been answered and we will proceed with previously discussed  procedure noted and signed in the consent form in the patient's record.    Felice Hope BMD FACS 10:28 AM  10/07/2011

## 2011-10-07 NOTE — Transfer of Care (Signed)
Immediate Anesthesia Transfer of Care Note  Patient: Darlene Patterson  Procedure(s) Performed: Procedure(s) (LRB): LAPAROSCOPIC GASTRIC BANDING (N/A) LAPAROSCOPIC REPAIR OF HIATAL HERNIA (N/A)  Patient Location: PACU  Anesthesia Type: General  Level of Consciousness: awake, sedated and patient cooperative  Airway & Oxygen Therapy: Patient Spontanous Breathing and Patient connected to face mask oxygen  Post-op Assessment: Report given to PACU RN and Post -op Vital signs reviewed and stable  Post vital signs: Reviewed and stable  Complications: No apparent anesthesia complications

## 2011-10-07 NOTE — Anesthesia Postprocedure Evaluation (Signed)
  Anesthesia Post-op Note  Patient: Darlene Patterson  Procedure(s) Performed: Procedure(s) (LRB): LAPAROSCOPIC GASTRIC BANDING (N/A) LAPAROSCOPIC REPAIR OF HIATAL HERNIA (N/A)  Patient Location: PACU  Anesthesia Type: General  Level of Consciousness: awake and alert   Airway and Oxygen Therapy: Patient Spontanous Breathing  Post-op Pain: mild  Post-op Assessment: Post-op Vital signs reviewed, Patient's Cardiovascular Status Stable, Respiratory Function Stable, Patent Airway and No signs of Nausea or vomiting  Post-op Vital Signs: stable  Complications: No apparent anesthesia complications

## 2011-10-07 NOTE — Op Note (Signed)
@  DATE@ Surgeon: Wenda Low, MD, FACS Asst:  Lodema Pilot, DO  Procedure: Laparoscopic adjustable gastric banding with APS system and 2 suture repair of HH  Anes:  General  EBL:  Minimal  Description of Procedure  The patient was taken to OR # 1 and given general anesthesia.  After a prep with PCMX the patient was draped and a timeout performed.  Access to the abdomen was achieved with a 0 degree Optiview technique through the left upper quadrant.    Adhesions were minimal although the prior Roseanne Reno site from hysterectomy may be developing a hernia.  Ports were placed to the the right of the midline including a 15 trocar in  the right upper quadrant placed obliquely.  The Satira Mccallum was used to retract the left lateral segment and the peritoneum was incised along the left crus.    The balloon test was performed using 10 cc of air to reveal a hiatus hernia that had been seen on the UGI.  Posterior dissection and two endostitiches with ty knots closed the defect so that the 10 cc balloon was held in the abdomen.    The pars flaccida technique was utilized to insert the blunt "finger " dissector from right to left behind the stomach.    The lapband APS  Had been previously flushed and was inserted through the 15 trocar.  It was placed in the blunt dissector tip and pulled around behind the stomach.   The band was plicated with 3 sutures using free needles and held in place with Ty Knots. .  The tubing was brought out through the lower incision on the right and connected to the port which had mesh sewn onto the back and was placed into the subcutaneous pocket.  Incisons were closed with 4-0 vicryl and dermabond The patient was taken to the PACU in stable condition.    Matt B. Daphine Deutscher, MD, [facility], [place] [phone]

## 2011-10-08 ENCOUNTER — Encounter (HOSPITAL_COMMUNITY): Payer: Self-pay | Admitting: Surgery

## 2011-10-08 ENCOUNTER — Inpatient Hospital Stay (HOSPITAL_COMMUNITY): Payer: 59

## 2011-10-08 DIAGNOSIS — Z9884 Bariatric surgery status: Secondary | ICD-10-CM

## 2011-10-08 LAB — DIFFERENTIAL
Basophils Relative: 0 % (ref 0–1)
Eosinophils Absolute: 0 10*3/uL (ref 0.0–0.7)
Eosinophils Relative: 0 % (ref 0–5)
Lymphs Abs: 1.5 10*3/uL (ref 0.7–4.0)
Monocytes Relative: 5 % (ref 3–12)
Neutrophils Relative %: 86 % — ABNORMAL HIGH (ref 43–77)

## 2011-10-08 LAB — CBC
Hemoglobin: 13.3 g/dL (ref 12.0–15.0)
MCH: 28.6 pg (ref 26.0–34.0)
MCHC: 34.1 g/dL (ref 30.0–36.0)
MCV: 83.9 fL (ref 78.0–100.0)
Platelets: 360 10*3/uL (ref 150–400)

## 2011-10-08 MED ORDER — IOHEXOL 300 MG/ML  SOLN: 20.0000 mL | Freq: Once | INTRAMUSCULAR | Status: DC | PRN

## 2011-10-08 MED ORDER — OXYCODONE-ACETAMINOPHEN 5-325 MG/5ML PO SOLN: 5.0000 mL | ORAL | Status: DC | PRN

## 2011-10-08 NOTE — Progress Notes (Signed)
Utilization review completed.  

## 2011-10-08 NOTE — Discharge Summary (Signed)
Physician Discharge Summary  Patient ID: Darlene Patterson MRN: [id number] DOB/AGE: 1970/09/15 41 y.o.  Admit date: 10/07/2011 Discharge date: 10/08/2011  Admission Diagnoses:  Obesity and GER  Discharge Diagnoses:  same  Active Problems:  Lapband APS + [facility] repair July 2013   Surgery:  lapband APS with Hiatus hernia repari  Discharged Condition: improved  Hospital Course:   Had surgery.  PD 1 had UGI which was good.  Ready for discharge  Consults: none  Significant Diagnostic Studies: UGI    Discharge Exam: Blood pressure 126/83, pulse 102, temperature 98.7 F (37.1 C), temperature source Oral, resp. rate 18, height 5\' 7"  (1.702 m), weight 254 lb 4 oz (115.327 kg), last menstrual period 04/24/2011, SpO2 97.00%. Incisions ok.  Had no nausea after surgery with avoidance of inhalation agents  Disposition: 06-Home-Health Care Svc  Discharge Orders    Future Appointments: Provider: Department: Dept Phone: Center:   10/21/2011 4:00 PM [facility] [phone] NDM   10/23/2011 9:50 AM Valarie Merino, MD [facility] [phone] None     Future Orders Please Complete By Expires   Diet Carb Modified      Increase activity slowly      Discharge instructions      Comments:   Follow bariatric diet     Medication List  As of 10/08/2011  9:43 AM   TAKE these medications         aspirin 81 MG tablet   Take 81 mg by mouth daily.      b complex vitamins tablet   Take 1 tablet by mouth daily.      MAGNESIUM PO   Take 800 mg by mouth daily.      MULTIVITAMIN GUMMIES ADULT Chew   Chew 1 tablet by mouth daily.      oxyCODONE-acetaminophen 5-325 MG/5ML solution   Commonly known as: ROXICET   Take 5-10 mLs by mouth every 4 (four) hours as needed for pain.           Follow-up Information    Call Valarie Merino, MD.   Contact information:   [facility] [address] [place]  [address] [phone]          Signed: Valarie Merino 10/08/2011, 9:43 AM

## 2011-10-08 NOTE — Progress Notes (Signed)
Patient alert and oriented. VSS. Ambulating well. Tolerating diet of protein shake. Pain controlled. UGI swallow study normal, band at 2 and 8 o'clock position. Reviewed discharge instructions with the patient this am and provided copy; post-op follow-up appointments in place with Dr. Daphine Deutscher and the nutritionists. Patient stated understanding of discharge instructions.  ADJUSTABLE GASTRIC BAND DISCHARGE INSTRUCTIONS  Drs. Fredrik Rigger, [place], Breiana Stratmann, and [place]  Call if you have any problems.   Call [phone] and ask for the surgeon on call.    If you need immediate assistance come to the ER at [facility]. Tell the ER personnel that you are a new post-op gastric banding patient. Signs and symptoms to report:   Severe vomiting or nausea. If you cannot tolerate clear liquids for longer than 1 day, you need to call your surgeon.    Abdominal pain which does not get better after taking your pain medication   Fever greater than 101 F degree   Difficulty breathing   Chest pain    Redness, swelling, drainage, or foul odor at incision sites    If your incisions open or pull apart   Swelling or pain in calf (lower leg)   Diarrhea, frequent watery, uncontrolled bowel movements.   Constipation, (no bowel movements for 3 days) if this occurs, Take Milk of Magnesia, 2 tablespoons by mouth, 3 times a day for 2 days if needed.  Call your doctor if constipation continues. Stop taking Milk of Magnesia once you have had a bowel movement. You may also use Miralax according to the label instructions.   Anything you consider "abnormal for you".   Normal side effects after Surgery:   Unable to sleep at night or concentrate   Irritability   Being tearful (crying) or depressed   These are common complaints, possibly related to your anesthesia, stress of surgery and change in lifestyle, that usually go away a few weeks after surgery.  If these feelings continue, call your medical doctor.  Wound  Care You may have surgical glue, steri-strips, or staples over your incisions after surgery.  Surgical glue:  Looks like a clear film over your incisions and will wear off gradually. Steri-strips: Strips of tape over your incisions. You may notice a yellowish color on the skin underneath the steri-strips. This is a substance used to make the steri-strips stick better. Do not pull the steri-strips off - let them fall off. Staples: Cherlynn Polo may be removed before you leave the hospital. If you go home with staples, call [facility] [phone]) for an appointment with your surgeon's nurse to have staples removed in 7 - 10 days. Showering: You may shower two days after your surgery unless otherwise instructed by your surgeon. Wash gently around wounds with warm soapy water, rinse well, and gently pat dry.  If you have a drain, you may need someone to hold this while you shower. Avoid tub baths until staples are removed and incisions are healed.    Medications   Medications should be liquid or crushed if larger than the size of a dime.  Extended release pills should not be crushed.   Depending on the size and number of medications you take, you may need to stagger/change the time you take your medications so that you do not over-fill your pouch.    Make sure you follow-up with your primary care physician to make medication adjustments needed during rapid weight loss and life-style adjustment.   If you are diabetic, follow up with the  doctor that prescribes your diabetes medication(s) within one week after surgery and check your blood sugar regularly.   Do not drive while taking narcotics!   Do not take acetaminophen (Tylenol) and Roxicet or Lortab Elixir at the same time since these pain medications contain acetaminophen.  Diet at home: (First 2 Weeks)  You will see the nutritionist two weeks after your surgery. She will advance your diet if you are tolerating liquids well. Once at home,  if you have severe vomiting or nausea and cannot tolerate clear liquids lasting longer than 1 day, call your surgeon.  For Same Day Surgery Discharge Patients: The day of surgery drink water only: 2 ounces every 4 hours. If you are tolerating water, begin drinking your high protein shake the next morning. For Overnight Stay Patients: Begin high protein shake 2 ounces every 3 hours, 5 - 6 times per day.  Gradually increase the amount you drink as tolerated.  You may find it easier to slowly sip shakes throughout the day.  It is important to get your proteins in first.   Protein Shake   Drink at least 2 ounces of shake 5-6 times per day   Each serving of protein shakes should have a minimum of 15 grams of protein and no more than 5 grams of carbohydrate    Increase the amount of protein shake you drink as tolerated   Protein powder may be added to fluids such as non-fat milk or Lactaid milk (limit to 20 grams added protein powder per serving   The initial goal is to drink at least 8 ounces of protein shake/drink per day (or as directed by the nutritionist). Some examples of protein shakes are [employer], [employer]illard[employer], EAS Edge HP, and Unjury. Hydration   Gradually increase the amount of water and other liquids as tolerated (See Acceptable Fluids)   Gradually increase the amount of protein shake as tolerated     Sip fluids slowly and throughout the day   May use Sugar substitutes, use sparingly (limit to 6 - 8 packets per day).  Your fluid goal is 64 ounces of fluid daily. It may take a few weeks to build up to this.         32 oz (or more) should be clear liquids and 32 oz (or more) should be full liquids.         Liquids should not contain sugar, caffeine, or carbonation!  Acceptable Fluids Clear Liquids:   Water or Sugar-free flavored water, Fruit H2O   Decaffeinated coffee or tea (sugar-free)   Crystal Lite, Wyler's Lite, Minute Maid Lite   Sugar-free Jell-O   Bouillon or broth    Sugar-free Popsicle:   *Less than 20 calories each; Limit 1 per day   Full Liquids:              Protein Shakes/Drinks + 2 choices per day of other full liquids shown below.    Other full liquids must be: No more than 12 grams of Carbs per serving,  No more than 3 grams of Fat per serving   Strained low-fat cream soup   Non-Fat milk   Fat-free Lactaid Milk   Sugar-free yogurt (Dannon Lite & Fit) Vitamins and Minerals (Start 1 day after surgery unless otherwise directed)   1 Chewable Multivitamin / Multimineral Supplement (i.e. Centrum for Adults)   Chewable Calcium Citrate with Vitamin D-3. Take 1500 mg each day.           (Example:  3 Chewable Calcium Plus 600 with Vitamin D-3 can be found at [facility])           Do not mix multivitamins containing iron with calcium supplements; take 2 hours   apart   Do not substitute Tums (calcium carbonate) for your calcium   Menstruating women and those at risk for anemia may need extra iron. Talk with your doctor to see if you need additional iron.     If you need extra iron:  Total daily Iron recommendations (including Vitamins) = 50 - 100 mg Iron/day Do not stop taking or change any vitamins or minerals until you talk to your nutritionist or surgeon. Your nutritionist and / or physician must approve all vitamin and mineral supplements. Exercise For maximum success, begin exercising as soon as your doctor recommends. Make sure your physician approves any physical activity.   Depending on fitness level, begin with a simple walking program   Walk 5-15 minutes each day, 7 days per week.    Slowly increase until you are walking 30-45 minutes per day   Consider joining our BELT program. [phone] or email belt@[employer] .edu Things to remember:   You may have sexual relations when you feel comfortable. It is VERY important for female patients to use a reliable birth control method. Fertility often increases after surgery. Do not get pregnant for at least 18  months.   It is very important to keep all follow up appointments with your surgeon, nutritionist, primary care physician, and behavioral health practitioner. After the first year, please follow up with your bariatric surgeon at least once a year in order to maintain best weight loss results.  [facility] [place] [facility]: [phone] [facility]: [phone]   Free counseling is available for you and your family through collaboration between [facility] and [place]. Please call [phone] and leave a message.    Consider purchasing a medical alert bracelet that says you had lap-band surgery.    The [facility] has a free Bariatric Surgery Support Group that meets monthly, the 3rd Thursday, 6 pm, Classroom #1, [employer]. You may register online at [email or web address], but registration is not necessary. Select Classes and Support Groups, Bariatric Surgery, or Call [phone]   Do not return to work or drive until cleared by your surgeon   Use your CPAP when sleeping if applicable   Do not lift anything greater than ten pounds for at least two weeks.   You will probably have your first fill (fluid added to your band) 6 weeks after surgery

## 2011-10-08 NOTE — Progress Notes (Signed)
Pt discharged to home. DC instructions given with husband at bedside. No concerns voiced. Prescription x 1 given for pain med. Left unit in wheelchair pushed by nurse tech. Left in good condition.

## 2011-10-09 ENCOUNTER — Encounter (INDEPENDENT_AMBULATORY_CARE_PROVIDER_SITE_OTHER): Payer: Self-pay

## 2011-10-21 ENCOUNTER — Ambulatory Visit: Payer: 59

## 2011-10-22 ENCOUNTER — Encounter: Payer: Self-pay | Admitting: *Deleted

## 2011-10-22 ENCOUNTER — Encounter: Payer: 59 | Attending: Surgery | Admitting: *Deleted

## 2011-10-22 VITALS — Ht 67.0 in | Wt 246.0 lb

## 2011-10-22 DIAGNOSIS — Z01818 Encounter for other preprocedural examination: Secondary | ICD-10-CM | POA: Insufficient documentation

## 2011-10-22 DIAGNOSIS — Z713 Dietary counseling and surveillance: Secondary | ICD-10-CM | POA: Insufficient documentation

## 2011-10-22 DIAGNOSIS — E669 Obesity, unspecified: Secondary | ICD-10-CM

## 2011-10-22 NOTE — Patient Instructions (Addendum)
Patient to follow Phase 3A-Soft, High Protein Diet and follow-up at [facility] in 6 weeks for 2 months post-op nutrition visit for diet advancement. 

## 2011-10-22 NOTE — Progress Notes (Signed)
Bariatric Follow-Up:  Appt start time: 0900 end time:  1000.  2 Week Post-Operative Nutrition Follow Up Visit  Patient was seen on 10/22/2011 for Post-Operative Nutrition [facility].   Surgery date: 10/07/11 Surgery type: LAGB Start weight at [facility]: 261.4 lbs  Weight today: 246.0 lbs Weight change: 15.4 lbs Total weight lost: 15.4 lbs BMI: 38.5 kg/m^2  Recent Activity: Walks daily.  The following the learning objectives were met by the patient during this appt:  Identifies Phase 3A (Soft, High Proteins) Dietary Goals and will begin from 2 weeks post-operatively to 2 months post-operatively  Identifies appropriate sources of fluids and proteins   States protein recommendations and appropriate sources post-operatively  Identifies the need for appropriate texture modifications, mastication, and bite sizes when consuming solids  Identifies appropriate multivitamin and calcium sources post-operatively  Describes the need for physical activity post-operatively and will follow MD recommendations  States when to call healthcare provider regarding medication questions or post-operative complications  Handouts given during visit include:  Phase 3A: Soft, High Protein Diet Handout  Band Fill Guidelines Handout  Follow-Up Plan: Patient will follow-up at [facility] in 6 weeks for 2 months post-op nutrition visit for diet advancement per MD.  Samples given:  Bariatric Advantage Calcium Citrate - 3 ea Lot # [id number]; Exp: 09/13

## 2011-10-23 ENCOUNTER — Encounter (INDEPENDENT_AMBULATORY_CARE_PROVIDER_SITE_OTHER): Payer: Self-pay | Admitting: Surgery

## 2011-10-23 ENCOUNTER — Ambulatory Visit (INDEPENDENT_AMBULATORY_CARE_PROVIDER_SITE_OTHER): Payer: 59 | Admitting: Surgery

## 2011-10-23 VITALS — BP 124/72 | HR 70 | Temp 97.6°F | Resp 16 | Ht 67.0 in | Wt 248.2 lb

## 2011-10-23 DIAGNOSIS — Z9884 Bariatric surgery status: Secondary | ICD-10-CM

## 2011-10-23 NOTE — Patient Instructions (Addendum)
Stay on diet and see me back in early Sept for first band fill.

## 2011-10-23 NOTE — Progress Notes (Signed)
Darlene Patterson Body mass index is 38.88 kg/(m^2).  Having regurgitation:  no  Nocturnal reflux?  no  Amount of fill  Not yet  Ready for follow-up fill in early Sept.  Doing well.  Incisions intact.

## 2011-11-21 ENCOUNTER — Ambulatory Visit (INDEPENDENT_AMBULATORY_CARE_PROVIDER_SITE_OTHER): Payer: 59 | Admitting: Surgery

## 2011-11-21 ENCOUNTER — Encounter (INDEPENDENT_AMBULATORY_CARE_PROVIDER_SITE_OTHER): Payer: Self-pay | Admitting: General Surgery

## 2011-11-21 ENCOUNTER — Encounter (INDEPENDENT_AMBULATORY_CARE_PROVIDER_SITE_OTHER): Payer: Self-pay | Admitting: Surgery

## 2011-11-21 VITALS — BP 130/82 | HR 70 | Temp 97.3°F | Resp 16 | Ht 67.0 in | Wt 245.5 lb

## 2011-11-21 DIAGNOSIS — Z9884 Bariatric surgery status: Secondary | ICD-10-CM

## 2011-11-21 NOTE — Patient Instructions (Signed)

## 2011-11-21 NOTE — Progress Notes (Signed)
Darlene Patterson Body mass index is 38.45 kg/(m^2).  Having regurgitation:  no  Nocturnal reflux?  no  Amount of fill  1.5  This is her first fill.  Discussed dos and donts.  Completed a form that she cannot drink Etoh on cruise or otherwise.  Return to full work now. Return in 5 weeks.

## 2011-12-03 ENCOUNTER — Encounter: Payer: 59 | Attending: Surgery | Admitting: *Deleted

## 2011-12-03 ENCOUNTER — Encounter: Payer: Self-pay | Admitting: *Deleted

## 2011-12-03 VITALS — Ht 67.0 in | Wt 243.0 lb

## 2011-12-03 DIAGNOSIS — Z713 Dietary counseling and surveillance: Secondary | ICD-10-CM | POA: Insufficient documentation

## 2011-12-03 DIAGNOSIS — E669 Obesity, unspecified: Secondary | ICD-10-CM

## 2011-12-03 DIAGNOSIS — Z01818 Encounter for other preprocedural examination: Secondary | ICD-10-CM | POA: Insufficient documentation

## 2011-12-03 NOTE — Progress Notes (Signed)
Follow-up visit:  8 Weeks Post-Operative LAGB Surgery  Medical Nutrition Therapy:  Appt start time: 1230 end time:  1300.  Primary concerns today: Post-operative Bariatric Surgery Nutrition Management.  Surgery date: 10/07/11 Surgery type: LAGB Start weight at [facility]: 261.4 lbs  Weight today: 243.0 lbs Weight change: 3.0 lbs Total weight lost: 18.4 lbs BMI: 38.1 kg/m^2  TANITA BODY COMP RESULTS   05/19/11  10/22/11 12/03/11  %Fat  51.0%  50.4% 50.2%  FM (lbs)  131.5  124.0 122.0  FFM (lbs)  126.5  122.0 121.0  TBW (lbs)  92.5  89.5 88.5   Fluid intake: >64 oz Estimated total protein intake: 60+ g  Medications: No changes  Supplementation: Taking MVI as directed. Was taking 1000 mg calcium citrate one time/day. Discussed taking 500 mg TID.   Using straws: No Drinking while eating: No Hair loss: No Carbonated beverages: No N/V/D/C: Episode of food "sticking" and pain after stressful day with 8 hours between meals; ate too quickly. None since. Last Lap-Band fill:  11/05/11; Feels pretty solid in the green zone  Recent physical activity:  Walks while working in [employer]; Waiting to hear back from [employer] program.  Progress Towards Goal(s):  In progress.   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    Intervention:  Nutrition education.  Monitoring/Evaluation:  Dietary intake, exercise, lap band fills, and body weight. Follow up in 1 month for 3 month post-op visit.

## 2011-12-03 NOTE — Patient Instructions (Addendum)
Goals:  Follow Phase 3B: High Protein + Non-Starchy Vegetables  Eat 3-6 small meals/snacks, every 3-5 hrs  Increase lean protein foods to meet 60-80g goal  Increase fluid intake to 64oz +  Add 15 grams of carbohydrate (fruit, whole grain, starchy vegetable) with meals  Avoid drinking 15 minutes before, during and 30 minutes after eating  Aim for >30 min of physical activity daily  Take calcium in 500 mg doses TID.

## 2011-12-25 ENCOUNTER — Encounter (INDEPENDENT_AMBULATORY_CARE_PROVIDER_SITE_OTHER): Payer: Self-pay

## 2011-12-25 ENCOUNTER — Ambulatory Visit (INDEPENDENT_AMBULATORY_CARE_PROVIDER_SITE_OTHER): Payer: 59 | Admitting: Physician Assistant

## 2011-12-25 VITALS — BP 106/70 | HR 76 | Temp 98.5°F | Ht 67.0 in | Wt 244.8 lb

## 2011-12-25 DIAGNOSIS — Z4651 Encounter for fitting and adjustment of gastric lap band: Secondary | ICD-10-CM

## 2011-12-25 NOTE — Progress Notes (Signed)
  HISTORY: Darlene Patterson is a 41 y.o.female who received an AP-Standard lap-band in July 2013 by Dr. Daphine Deutscher. She comes in having been seen one month ago and has lost one pound. She denies regurgitation or reflux symptoms. She isn't particularly hungry but she thinks a fill would be helpful. She gets hunger pain in the morning which had resolved after her last fill.  VITAL SIGNS: Filed Vitals:   12/25/11 1019  BP: 106/70  Pulse: 76  Temp: 98.5 F (36.9 C)    PHYSICAL EXAM: Physical exam reveals a very well-appearing 41 y.o.female in no apparent distress Neurologic: Awake, alert, oriented Psych: Bright affect, conversant Respiratory: Breathing even and unlabored. No stridor or wheezing Abdomen: Soft, nontender, nondistended to palpation. Incisions well-healed. No incisional hernias. Port easily palpated. Extremities: Atraumatic, good range of motion.  ASSESMENT: 41 y.o.  female  s/p AP-Standard lap-band.   PLAN: The patient's port was accessed with a 20G Huber needle without difficulty. Clear fluid was aspirated and 0.5 mL saline was added to the port. The patient was able to swallow water without difficulty following the procedure and was instructed to take clear liquids for the next 24-48 hours and advance slowly as tolerated.

## 2011-12-25 NOTE — Patient Instructions (Signed)
Take clear liquids tonight. Thin protein shakes are ok to start tomorrow morning. Slowly advance your diet thereafter. Call us if you have persistent vomiting or regurgitation, night cough or reflux symptoms. Return as scheduled or sooner if you notice no changes in hunger/portion sizes.  

## 2012-01-29 ENCOUNTER — Encounter (INDEPENDENT_AMBULATORY_CARE_PROVIDER_SITE_OTHER): Payer: Self-pay

## 2012-01-29 ENCOUNTER — Ambulatory Visit (INDEPENDENT_AMBULATORY_CARE_PROVIDER_SITE_OTHER): Payer: 59 | Admitting: Physician Assistant

## 2012-01-29 VITALS — BP 118/80 | HR 94 | Temp 97.7°F | Ht 67.0 in | Wt 245.0 lb

## 2012-01-29 DIAGNOSIS — Z4651 Encounter for fitting and adjustment of gastric lap band: Secondary | ICD-10-CM

## 2012-01-29 NOTE — Progress Notes (Signed)
  HISTORY: Darlene Patterson is a 41 y.o.female who received an AP-Standard lap-band in July 2013 by Dr. Daphine Deutscher. She comes in with good hunger control but slightly increased portion sizes. She denies persistent regurgitation or reflux. She has some difficulty with bread as expected. She wants an adjustment today.  VITAL SIGNS: Filed Vitals:   01/29/12 1044  BP: 118/80  Pulse: 94  Temp: 97.7 F (36.5 C)    PHYSICAL EXAM: Physical exam reveals a very well-appearing 41 y.o.female in no apparent distress Neurologic: Awake, alert, oriented Psych: Bright affect, conversant Respiratory: Breathing even and unlabored. No stridor or wheezing Abdomen: Soft, nontender, nondistended to palpation. Incisions well-healed. No incisional hernias. Port easily palpated. Extremities: Atraumatic, good range of motion.  ASSESMENT: 41 y.o.  female  s/p AP-Standard lap-band.   PLAN: The patient's port was accessed with a 20G Huber needle without difficulty. Clear fluid was aspirated and 0.5 mL saline was added to the port. The patient was able to swallow water without difficulty following the procedure and was instructed to take clear liquids for the next 24-48 hours and advance slowly as tolerated.

## 2012-01-29 NOTE — Patient Instructions (Signed)
Take clear liquids tonight. Thin protein shakes are ok to start tomorrow morning. Slowly advance your diet thereafter. Call us if you have persistent vomiting or regurgitation, night cough or reflux symptoms. Return as scheduled or sooner if you notice no changes in hunger/portion sizes.  

## 2012-03-04 ENCOUNTER — Encounter (INDEPENDENT_AMBULATORY_CARE_PROVIDER_SITE_OTHER): Payer: 59

## 2012-03-11 ENCOUNTER — Encounter (INDEPENDENT_AMBULATORY_CARE_PROVIDER_SITE_OTHER): Payer: Self-pay

## 2012-03-11 ENCOUNTER — Ambulatory Visit (INDEPENDENT_AMBULATORY_CARE_PROVIDER_SITE_OTHER): Payer: 59 | Admitting: Physician Assistant

## 2012-03-11 VITALS — BP 138/70 | HR 92 | Resp 12 | Ht 67.0 in | Wt 254.0 lb

## 2012-03-11 DIAGNOSIS — Z4651 Encounter for fitting and adjustment of gastric lap band: Secondary | ICD-10-CM

## 2012-03-11 NOTE — Progress Notes (Signed)
  HISTORY: Darlene Patterson is a 42 y.o.female who received an Ap-Standard lap-band in July 2013 by Dr. Daphine Deutscher. She comes in with 9 lbs weight gain over the holidays. No regurgitationor reflux. She attributes her gain to food choices and volumes. She feels little restriction she says.  VITAL SIGNS: Filed Vitals:   03/11/12 1535  BP: 138/70  Pulse: 92  Resp: 12    PHYSICAL EXAM: Physical exam reveals a very well-appearing 43 y.o.female in no apparent distress Neurologic: Awake, alert, oriented Psych: Bright affect, conversant Respiratory: Breathing even and unlabored. No stridor or wheezing Abdomen: Soft, nontender, nondistended to palpation. Incisions well-healed. No incisional hernias. Port easily palpated. Extremities: Atraumatic, good range of motion.  ASSESMENT: 42 y.o.  female  s/p AP-Standard lap-band.   PLAN: The patient's port was accessed with a 20G Huber needle without difficulty. Clear fluid was aspirated and 0.5 mL saline was added to the port. The patient was able to swallow water without difficulty following the procedure and was instructed to take clear liquids for the next 24-48 hours and advance slowly as tolerated.

## 2012-03-11 NOTE — Patient Instructions (Signed)
Take clear liquids tonight. Thin protein shakes are ok to start tomorrow morning. Slowly advance your diet thereafter. Call us if you have persistent vomiting or regurgitation, night cough or reflux symptoms. Return as scheduled or sooner if you notice no changes in hunger/portion sizes.  

## 2012-04-08 ENCOUNTER — Ambulatory Visit (INDEPENDENT_AMBULATORY_CARE_PROVIDER_SITE_OTHER): Payer: 59 | Admitting: Physician Assistant

## 2012-04-08 ENCOUNTER — Encounter (INDEPENDENT_AMBULATORY_CARE_PROVIDER_SITE_OTHER): Payer: Self-pay

## 2012-04-08 VITALS — BP 114/66 | HR 89 | Temp 98.6°F | Ht 67.0 in | Wt 247.0 lb

## 2012-04-08 DIAGNOSIS — Z4651 Encounter for fitting and adjustment of gastric lap band: Secondary | ICD-10-CM

## 2012-04-08 NOTE — Progress Notes (Signed)
  HISTORY: ADLEE PAAR is a 42 y.o.female who received an AP-Standard lap-band in July 2013 by Dr. Daphine Deutscher. She comes in with 7 lbs weight loss in the past month. She denies regurgitation or reflux symptoms but notices she occasionally has a desire to have second helpings at dinnertime. She doesn't have persistent hunger though.  VITAL SIGNS: Filed Vitals:   04/08/12 1025  BP: 114/66  Pulse: 89  Temp: 98.6 F (37 C)    PHYSICAL EXAM: Physical exam reveals a very well-appearing 42 y.o.female in no apparent distress Neurologic: Awake, alert, oriented Psych: Bright affect, conversant Respiratory: Breathing even and unlabored. No stridor or wheezing Abdomen: Soft, nontender, nondistended to palpation. Incisions well-healed. No incisional hernias. Port easily palpated. Extremities: Atraumatic, good range of motion.  ASSESMENT: 42 y.o.  female  s/p AP-Standard lap-band.   PLAN: The patient's port was accessed with a 20G Huber needle without difficulty. Clear fluid was aspirated and 0.25 mL saline was added to the port. The patient was able to swallow water without difficulty following the procedure and was instructed to take clear liquids for the next 24-48 hours and advance slowly as tolerated.

## 2012-04-08 NOTE — Patient Instructions (Signed)
Take clear liquids tonight. Thin protein shakes are ok to start tomorrow morning. Slowly advance your diet thereafter. Call us if you have persistent vomiting or regurgitation, night cough or reflux symptoms. Return as scheduled or sooner if you notice no changes in hunger/portion sizes.  

## 2012-05-13 ENCOUNTER — Ambulatory Visit (INDEPENDENT_AMBULATORY_CARE_PROVIDER_SITE_OTHER): Payer: 59 | Admitting: Physician Assistant

## 2012-05-13 ENCOUNTER — Encounter (INDEPENDENT_AMBULATORY_CARE_PROVIDER_SITE_OTHER): Payer: Self-pay

## 2012-05-13 DIAGNOSIS — Z9884 Bariatric surgery status: Secondary | ICD-10-CM

## 2012-05-13 NOTE — Patient Instructions (Signed)
Return in one month. Focus on good food choices as well as physical activity. Return sooner if you have an increase in hunger, portion sizes or weight. Return also for difficulty swallowing, night cough, reflux.   

## 2012-05-13 NOTE — Progress Notes (Signed)
  HISTORY: Darlene Patterson is a 42 y.o.female who received an AP-Standard lap-band in July 2013 by Dr. Daphine Deutscher. She comes in with 4 lbs weight loss in the last month. She is increasing her exercise and is engaged with a training program at a local fitness center. She isn't particularly hungry and her portion sizes are remaining small. She has no regurgitation, reflux or night cough.  VITAL SIGNS: Filed Vitals:   05/13/12 0940  BP: 134/90  Pulse: 88    PHYSICAL EXAM: Physical exam reveals a very well-appearing 42 y.o.female in no apparent distress Neurologic: Awake, alert, oriented Psych: Bright affect, conversant Respiratory: Breathing even and unlabored. No stridor or wheezing Extremities: Atraumatic, good range of motion. Skin: Warm, Dry, no rashes Musculoskeletal: Normal gait, Joints normal  ASSESMENT: 42 y.o.  female  s/p AP-Standard lap-band.   PLAN: As she's in the green zone and is continuing to lose weight, we deferred an adjustment today. We talked about maladaptive eating and how this could inhibit her weight loss. We'll have her back in one month unless she has increased hunger, portion sizes or weight, then we would see her sooner.

## 2012-05-14 ENCOUNTER — Emergency Department (HOSPITAL_COMMUNITY)
Admission: EM | Admit: 2012-05-14 | Discharge: 2012-05-14 | Disposition: A | Discharge status: Home / Self Care | Payer: No Typology Code available for payment source | Attending: Emergency Medicine | Admitting: Emergency Medicine

## 2012-05-14 ENCOUNTER — Emergency Department (HOSPITAL_COMMUNITY): Payer: No Typology Code available for payment source

## 2012-05-14 ENCOUNTER — Encounter (HOSPITAL_COMMUNITY): Payer: Self-pay | Admitting: Emergency Medicine

## 2012-05-14 DIAGNOSIS — Y92410 Unspecified street and highway as the place of occurrence of the external cause: Secondary | ICD-10-CM | POA: Insufficient documentation

## 2012-05-14 DIAGNOSIS — IMO0002 Reserved for concepts with insufficient information to code with codable children: Secondary | ICD-10-CM | POA: Insufficient documentation

## 2012-05-14 DIAGNOSIS — S8990XA Unspecified injury of unspecified lower leg, initial encounter: Secondary | ICD-10-CM | POA: Insufficient documentation

## 2012-05-14 DIAGNOSIS — Z8659 Personal history of other mental and behavioral disorders: Secondary | ICD-10-CM | POA: Insufficient documentation

## 2012-05-14 DIAGNOSIS — Z8679 Personal history of other diseases of the circulatory system: Secondary | ICD-10-CM | POA: Insufficient documentation

## 2012-05-14 DIAGNOSIS — Y9389 Activity, other specified: Secondary | ICD-10-CM | POA: Insufficient documentation

## 2012-05-14 DIAGNOSIS — M25562 Pain in left knee: Secondary | ICD-10-CM

## 2012-05-14 DIAGNOSIS — E669 Obesity, unspecified: Secondary | ICD-10-CM | POA: Insufficient documentation

## 2012-05-14 DIAGNOSIS — Z8719 Personal history of other diseases of the digestive system: Secondary | ICD-10-CM | POA: Insufficient documentation

## 2012-05-14 DIAGNOSIS — H5500 Unspecified nystagmus: Secondary | ICD-10-CM | POA: Insufficient documentation

## 2012-05-14 DIAGNOSIS — Z7982 Long term (current) use of aspirin: Secondary | ICD-10-CM | POA: Insufficient documentation

## 2012-05-14 DIAGNOSIS — R110 Nausea: Secondary | ICD-10-CM | POA: Insufficient documentation

## 2012-05-14 DIAGNOSIS — S99929A Unspecified injury of unspecified foot, initial encounter: Secondary | ICD-10-CM | POA: Insufficient documentation

## 2012-05-14 MED ORDER — ONDANSETRON 8 MG PO TBDP
8.0000 mg | ORAL_TABLET | Freq: Once | ORAL | Status: AC
  Administered 2012-05-14: 8 mg via ORAL
  Filled 2012-05-14: qty 1
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
## 2012-05-14 NOTE — ED Notes (Signed)
Pt. Cleared from back board.  C-collar in placed,PA at bedside.

## 2012-05-18 NOTE — ED Provider Notes (Signed)
Medical screening examination/treatment/procedure(s) were performed by non-physician practitioner and as supervising physician I was immediately available for consultation/collaboration.  Jones Skene, M.D.     Jones Skene, MD 05/18/12 [phone]

## 2012-06-17 ENCOUNTER — Encounter (INDEPENDENT_AMBULATORY_CARE_PROVIDER_SITE_OTHER): Payer: Self-pay | Admitting: General Surgery

## 2012-06-17 ENCOUNTER — Ambulatory Visit (INDEPENDENT_AMBULATORY_CARE_PROVIDER_SITE_OTHER): Payer: 59 | Admitting: Physician Assistant

## 2012-06-17 ENCOUNTER — Encounter (INDEPENDENT_AMBULATORY_CARE_PROVIDER_SITE_OTHER): Payer: Self-pay

## 2012-06-17 DIAGNOSIS — Z9884 Bariatric surgery status: Secondary | ICD-10-CM

## 2012-06-17 NOTE — Progress Notes (Signed)
  HISTORY: Darlene Patterson is a 41 y.o.female who received an AP-Standard lap-band in July 2013 by Dr. Daphine Deutscher. She comes in with 3 lbs weight loss since her last visit in March. She is exercising regularly. She has good control of her hunger and her portion sizes are remaining small. She has had no complaints of regurgitation or reflux. She does not want a fill today.  VITAL SIGNS: Filed Vitals:   06/17/12 1019  BP: 140/82  Pulse: 90  Temp: 97.6 F (36.4 C)    PHYSICAL EXAM: Physical exam reveals a very well-appearing 42 y.o.female in no apparent distress Neurologic: Awake, alert, oriented Psych: Bright affect, conversant Respiratory: Breathing even and unlabored. No stridor or wheezing Extremities: Atraumatic, good range of motion. Skin: Warm, Dry, no rashes Musculoskeletal: Normal gait, Joints normal  ASSESMENT: 42 y.o.  female  s/p AP-Standard lap-band.   PLAN: She is going on vacation this coming month. We'll have her back for her monthly visit afterwards. She's been encouraged to continue a healthy diet and to continue her exercise regimen.

## 2012-06-17 NOTE — Patient Instructions (Signed)
Return in one month. Focus on good food choices as well as physical activity. Return sooner if you have an increase in hunger, portion sizes or weight. Return also for difficulty swallowing, night cough, reflux.   

## 2012-07-15 ENCOUNTER — Encounter (INDEPENDENT_AMBULATORY_CARE_PROVIDER_SITE_OTHER): Payer: 59

## 2012-07-22 ENCOUNTER — Encounter (INDEPENDENT_AMBULATORY_CARE_PROVIDER_SITE_OTHER): Payer: Self-pay

## 2012-07-22 ENCOUNTER — Ambulatory Visit (INDEPENDENT_AMBULATORY_CARE_PROVIDER_SITE_OTHER): Payer: 59 | Admitting: Physician Assistant

## 2012-07-22 VITALS — BP 120/72 | HR 60 | Temp 97.2°F | Resp 18 | Ht 67.0 in | Wt 240.6 lb

## 2012-07-22 DIAGNOSIS — Z4651 Encounter for fitting and adjustment of gastric lap band: Secondary | ICD-10-CM

## 2012-07-22 NOTE — Patient Instructions (Signed)
Take clear liquids tonight. Thin protein shakes are ok to start tomorrow morning. Slowly advance your diet thereafter. Call us if you have persistent vomiting or regurgitation, night cough or reflux symptoms. Return as scheduled or sooner if you notice no changes in hunger/portion sizes.  

## 2012-07-22 NOTE — Progress Notes (Signed)
  HISTORY: Darlene Patterson is a 42 y.o.female who received an AP-Standard lap-band in July 2013 by Dr. Daphine Deutscher. She comes in with no appreciable change in weight. She went on a cruise recently and fortunately did not gain weight. She thinks she needs an adjustment however as she's able to eat more.  VITAL SIGNS: Filed Vitals:   07/22/12 0914  BP: 120/72  Pulse: 60  Temp: 97.2 F (36.2 C)  Resp: 18    PHYSICAL EXAM: Physical exam reveals a very well-appearing 42 y.o.female in no apparent distress Neurologic: Awake, alert, oriented Psych: Bright affect, conversant Respiratory: Breathing even and unlabored. No stridor or wheezing Abdomen: Soft, nontender, nondistended to palpation. Incisions well-healed. No incisional hernias. Port easily palpated. Extremities: Atraumatic, good range of motion.  ASSESMENT: 42 y.o.  female  s/p AP-Standard lap-band.   PLAN: The patient's port was accessed with a 20G Huber needle without difficulty. Clear fluid was aspirated and 0.25 mL saline was added to the port. The patient was able to swallow water without difficulty following the procedure and was instructed to take clear liquids for the next 24-48 hours and advance slowly as tolerated.

## 2012-08-26 ENCOUNTER — Encounter (INDEPENDENT_AMBULATORY_CARE_PROVIDER_SITE_OTHER): Payer: Self-pay | Admitting: General Surgery

## 2012-08-26 ENCOUNTER — Encounter (INDEPENDENT_AMBULATORY_CARE_PROVIDER_SITE_OTHER): Payer: Self-pay

## 2012-08-26 ENCOUNTER — Ambulatory Visit (INDEPENDENT_AMBULATORY_CARE_PROVIDER_SITE_OTHER): Payer: 59 | Admitting: Physician Assistant

## 2012-08-26 VITALS — BP 118/72 | HR 70 | Temp 98.2°F | Resp 15 | Ht 67.0 in | Wt 236.6 lb

## 2012-08-26 DIAGNOSIS — Z4651 Encounter for fitting and adjustment of gastric lap band: Secondary | ICD-10-CM

## 2012-08-26 NOTE — Patient Instructions (Signed)
Take clear liquids tonight. Thin protein shakes are ok to start tomorrow morning. Slowly advance your diet thereafter. Call us if you have persistent vomiting or regurgitation, night cough or reflux symptoms. Return as scheduled or sooner if you notice no changes in hunger/portion sizes.  

## 2012-08-26 NOTE — Progress Notes (Signed)
  HISTORY: Darlene Patterson is a 42 y.o.female who received an AP-Standard lap-band in July 2013 by Dr. Daphine Deutscher. She comes in with four pounds of weight loss since her last visit. She reports reduced hunger and portion size but she believes she could do better. No regurgitation or reflux symptoms. She would like a fill today.  VITAL SIGNS: Filed Vitals:   08/26/12 0914  BP: 118/72  Pulse: 70  Temp: 98.2 F (36.8 C)  Resp: 15    PHYSICAL EXAM: Physical exam reveals a very well-appearing 42 y.o.female in no apparent distress Neurologic: Awake, alert, oriented Psych: Bright affect, conversant Respiratory: Breathing even and unlabored. No stridor or wheezing Abdomen: Soft, nontender, nondistended to palpation. Incisions well-healed. No incisional hernias. Port easily palpated. Extremities: Atraumatic, good range of motion.  ASSESMENT: 42 y.o.  female  s/p AP-Standard lap-band.   PLAN: The patient's port was accessed with a 20G Huber needle without difficulty. Clear fluid was aspirated and 0.25 mL saline was added to the port. The patient was able to swallow water without difficulty following the procedure and was instructed to take clear liquids for the next 24-48 hours and advance slowly as tolerated.

## 2012-09-23 ENCOUNTER — Encounter (INDEPENDENT_AMBULATORY_CARE_PROVIDER_SITE_OTHER): Payer: Self-pay

## 2012-09-23 ENCOUNTER — Ambulatory Visit (INDEPENDENT_AMBULATORY_CARE_PROVIDER_SITE_OTHER): Payer: 59 | Admitting: Physician Assistant

## 2012-09-23 DIAGNOSIS — Z9884 Bariatric surgery status: Secondary | ICD-10-CM

## 2012-09-23 NOTE — Progress Notes (Signed)
  HISTORY: Darlene Patterson is a 42 y.o.female who received an AP-Standard lap-band in July 2013 by Dr. Daphine Deutscher. She comes in with stable weight but she reports her clothes fitting much more loosely than previously. She says her hunger and portion sizes are under good control and she has no reflux or regurgitation.  VITAL SIGNS: Filed Vitals:   09/23/12 0915  BP: 112/82  Pulse: 72  Temp: 97.8 F (36.6 C)  Resp: 16    PHYSICAL EXAM: Physical exam reveals a very well-appearing 42 y.o.female in no apparent distress Neurologic: Awake, alert, oriented Psych: Bright affect, conversant Respiratory: Breathing even and unlabored. No stridor or wheezing Extremities: Atraumatic, good range of motion. Skin: Warm, Dry, no rashes Musculoskeletal: Normal gait, Joints normal  ASSESMENT: 42 y.o.  female  s/p AP-Standard lap-band.   PLAN: It sounds like overall she's in the green zone. Her change in how her clothing fits is encouraging. She did not desire a fill today, which I think is ok. We'll have her back in three months but I asked her to call us for an earlier appointment if she notices her weight, hunger or portion sizes increasing. She voiced understanding and agreement.

## 2012-09-23 NOTE — Patient Instructions (Signed)
Return in two months. Focus on good food choices as well as physical activity. Return sooner if you have an increase in hunger, portion sizes or weight. Return also for difficulty swallowing, night cough, reflux.   

## 2012-12-02 ENCOUNTER — Encounter (INDEPENDENT_AMBULATORY_CARE_PROVIDER_SITE_OTHER): Payer: 59

## 2012-12-16 ENCOUNTER — Encounter (INDEPENDENT_AMBULATORY_CARE_PROVIDER_SITE_OTHER): Payer: Self-pay

## 2012-12-16 ENCOUNTER — Ambulatory Visit (INDEPENDENT_AMBULATORY_CARE_PROVIDER_SITE_OTHER): Payer: 59 | Admitting: Physician Assistant

## 2012-12-16 VITALS — BP 118/80 | HR 76 | Resp 18 | Ht 67.0 in | Wt 238.2 lb

## 2012-12-16 DIAGNOSIS — Z4651 Encounter for fitting and adjustment of gastric lap band: Secondary | ICD-10-CM

## 2012-12-16 NOTE — Patient Instructions (Signed)

## 2012-12-16 NOTE — Progress Notes (Signed)
  HISTORY: Darlene Patterson is a 42 y.o.female who received an AP-Standard lap-band in July 2013 by Dr. Daphine Deutscher. She comes in with stable weight since her last visit three months ago. In the meantime she went on a cruise where her weight increased to 244 lbs. She has since lost this weight by continuing her exercise regimen and watching what she eats. She denies regurgitation or reflux symptoms. She would like a small fill today to help her continue to lose weight.  VITAL SIGNS: Filed Vitals:   12/16/12 1012  BP: 118/80  Pulse: 76  Resp: 18    PHYSICAL EXAM: Physical exam reveals a very well-appearing 42 y.o.female in no apparent distress Neurologic: Awake, alert, oriented Psych: Bright affect, conversant Respiratory: Breathing even and unlabored. No stridor or wheezing Abdomen: Soft, nontender, nondistended to palpation. Incisions well-healed. No incisional hernias. Port easily palpated. Extremities: Atraumatic, good range of motion.  ASSESMENT: 42 y.o.  female  s/p AP-Standard lap-band.   PLAN: The patient's port was accessed with a 20G Huber needle without difficulty. Clear fluid was aspirated and 0.25 mL saline was added to the port. The patient was able to swallow water without difficulty following the procedure and was instructed to take clear liquids for the next 24-48 hours and advance slowly as tolerated.

## 2013-03-17 ENCOUNTER — Encounter (INDEPENDENT_AMBULATORY_CARE_PROVIDER_SITE_OTHER): Payer: Self-pay | Admitting: *Deleted

## 2013-03-17 ENCOUNTER — Encounter (INDEPENDENT_AMBULATORY_CARE_PROVIDER_SITE_OTHER): Payer: Self-pay

## 2013-03-17 ENCOUNTER — Ambulatory Visit (INDEPENDENT_AMBULATORY_CARE_PROVIDER_SITE_OTHER): Payer: 59 | Admitting: Physician Assistant

## 2013-03-17 VITALS — BP 118/80 | HR 72 | Temp 98.2°F | Resp 14 | Ht 67.0 in | Wt 237.8 lb

## 2013-03-17 DIAGNOSIS — Z9884 Bariatric surgery status: Secondary | ICD-10-CM

## 2013-03-17 NOTE — Patient Instructions (Signed)
Return in three months. Focus on good food choices as well as physical activity. Return sooner if you have an increase in hunger, portion sizes or weight. Return also for difficulty swallowing, night cough, reflux.   

## 2013-03-17 NOTE — Progress Notes (Signed)
  HISTORY: Darlene Patterson is a 43 y.o.female who received an AP-Standard lap-band in July 2013 by Dr. Daphine DeutscherMartin. She comes in with stable weight since her last visit in October 2014 but she reports a significant change in how her clothing fits. They are much looser than previous. She attributes this to a more aggressive exercise program that she and her husband are doing. She is not hungry and her portion sizes remain small. She denies regurgitation or reflux symptoms.  VITAL SIGNS: Filed Vitals:   03/17/13 0921  BP: 118/80  Pulse: 72  Temp: 98.2 F (36.8 C)  Resp: 14    PHYSICAL EXAM: Physical exam reveals a very well-appearing 43 y.o.female in no apparent distress Neurologic: Awake, alert, oriented Psych: Bright affect, conversant Respiratory: Breathing even and unlabored. No stridor or wheezing Extremities: Atraumatic, good range of motion. Skin: Warm, Dry, no rashes Musculoskeletal: Normal gait, Joints normal  ASSESMENT: 43 y.o.  female  s/p AP-Standard lap-band.   PLAN: She deferred a fill today as she feels that she's in the green zone. She's very happy with the change in her body composition despite little change in weight. We'll have her back in three months or sooner if needed.

## 2013-06-16 ENCOUNTER — Encounter (INDEPENDENT_AMBULATORY_CARE_PROVIDER_SITE_OTHER): Payer: 59

## 2013-07-14 ENCOUNTER — Ambulatory Visit (INDEPENDENT_AMBULATORY_CARE_PROVIDER_SITE_OTHER): Payer: 59 | Admitting: Physician Assistant

## 2013-07-14 ENCOUNTER — Encounter (INDEPENDENT_AMBULATORY_CARE_PROVIDER_SITE_OTHER): Payer: Self-pay

## 2013-07-14 VITALS — BP 126/80 | HR 72 | Temp 97.7°F | Wt 236.8 lb

## 2013-07-14 DIAGNOSIS — Z4651 Encounter for fitting and adjustment of gastric lap band: Secondary | ICD-10-CM

## 2013-07-14 NOTE — Progress Notes (Signed)
  HISTORY: Darlene Patterson is a 43 y.o.female who received an AP-Standard lap-band in July 2013 by Dr. Daphine DeutscherMartin. She comes in with stable weight since her last visit in January when no fill was done as she was in the green zone. She has no significant complaints of hunger or large portions but she's disappointed with the stagnation in her weight loss. She had [place] [facility] [place] [facility] and gained about 12 lbs then, but has since lost that weight. She is exercising regularly. Her husband has been planning and making meals, which she reports as being healthful. She would like a small fill today to get her weight loss back underway.  VITAL SIGNS: Filed Vitals:   07/14/13 1211  BP: 126/80  Pulse: 72  Temp: 97.7 F (36.5 C)    PHYSICAL EXAM: Physical exam reveals a very well-appearing 43 y.o.female in no apparent distress Neurologic: Awake, alert, oriented Psych: Bright affect, conversant Respiratory: Breathing even and unlabored. No stridor or wheezing Abdomen: Soft, nontender, nondistended to palpation. Incisions well-healed. No incisional hernias. Port easily palpated. Extremities: Atraumatic, good range of motion.  ASSESMENT: 43 y.o.  female  s/p AP-Standard lap-band.   PLAN: The patient's port was accessed with a 20G Huber needle without difficulty. Clear fluid was aspirated and 0.25 mL saline was added to the port. The patient was able to swallow water without difficulty following the procedure and was instructed to take clear liquids for the next 24-48 hours and advance slowly as tolerated.

## 2013-07-14 NOTE — Patient Instructions (Signed)
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
## 2013-10-13 ENCOUNTER — Encounter (INDEPENDENT_AMBULATORY_CARE_PROVIDER_SITE_OTHER): Payer: 59

## 2013-10-20 ENCOUNTER — Encounter (INDEPENDENT_AMBULATORY_CARE_PROVIDER_SITE_OTHER): Payer: Self-pay

## 2013-10-20 ENCOUNTER — Ambulatory Visit (INDEPENDENT_AMBULATORY_CARE_PROVIDER_SITE_OTHER): Payer: 59 | Admitting: Physician Assistant

## 2013-10-20 VITALS — BP 126/80 | HR 64 | Temp 98.0°F | Resp 14 | Ht 67.0 in | Wt 236.8 lb

## 2013-10-20 DIAGNOSIS — Z4651 Encounter for fitting and adjustment of gastric lap band: Secondary | ICD-10-CM

## 2013-10-20 NOTE — Patient Instructions (Signed)

## 2013-10-20 NOTE — Progress Notes (Signed)
  HISTORY: Darlene Patterson is a 43 y.o.female who received an AP-Standard lap-band in July 2013 by Dr. Daphine DeutscherMartin. The patient has maintained stable weight since their last visit in May, and has lost 22 lbs since surgery. She comes in with no particular increase in intake but with stagnation of weight loss despite exercise and relatively small portions.  VITAL SIGNS: Filed Vitals:   10/20/13 1139  BP: 126/80  Pulse: 64  Temp: 98 F (36.7 C)  Resp: 14    PHYSICAL EXAM: Physical exam reveals a very well-appearing 43 y.o.female in no apparent distress Neurologic: Awake, alert, oriented Psych: Bright affect, conversant Respiratory: Breathing even and unlabored. No stridor or wheezing Abdomen: Soft, nontender, nondistended to palpation. Incisions well-healed. No incisional hernias. Port easily palpated. Extremities: Atraumatic, good range of motion.  ASSESMENT: 43 y.o.  female  s/p AP-Standard lap-band.   PLAN: The patient's port was accessed with a 20G Huber needle without difficulty. Clear fluid was aspirated and 0.25 mL saline was added to the port. The patient was able to swallow water without difficulty following the procedure and was instructed to take clear liquids for the next 24-48 hours and advance slowly as tolerated.

## 2014-02-16 ENCOUNTER — Encounter (INDEPENDENT_AMBULATORY_CARE_PROVIDER_SITE_OTHER): Payer: 59

## 2014-05-16 ENCOUNTER — Emergency Department (HOSPITAL_COMMUNITY): Payer: 59 | Admitting: Anesthesiology

## 2014-05-16 ENCOUNTER — Encounter (HOSPITAL_COMMUNITY): Payer: Self-pay | Admitting: Emergency Medicine

## 2014-05-16 ENCOUNTER — Encounter (HOSPITAL_COMMUNITY)
Admission: EM | Disposition: A | Discharge status: Home / Self Care | Payer: Self-pay | Source: Home / Self Care | Attending: Emergency Medicine

## 2014-05-16 ENCOUNTER — Emergency Department (HOSPITAL_COMMUNITY): Payer: 59

## 2014-05-16 ENCOUNTER — Emergency Department (HOSPITAL_COMMUNITY)
Admission: EM | Admit: 2014-05-16 | Discharge: 2014-05-16 | Disposition: A | Discharge status: Home / Self Care | Payer: 59 | Attending: Emergency Medicine | Admitting: Emergency Medicine

## 2014-05-16 DIAGNOSIS — R1032 Left lower quadrant pain: Secondary | ICD-10-CM | POA: Diagnosis present

## 2014-05-16 DIAGNOSIS — Z79899 Other long term (current) drug therapy: Secondary | ICD-10-CM | POA: Diagnosis not present

## 2014-05-16 DIAGNOSIS — Z7982 Long term (current) use of aspirin: Secondary | ICD-10-CM | POA: Insufficient documentation

## 2014-05-16 DIAGNOSIS — I739 Peripheral vascular disease, unspecified: Secondary | ICD-10-CM | POA: Diagnosis not present

## 2014-05-16 DIAGNOSIS — N201 Calculus of ureter: Secondary | ICD-10-CM | POA: Diagnosis not present

## 2014-05-16 DIAGNOSIS — R319 Hematuria, unspecified: Secondary | ICD-10-CM

## 2014-05-16 DIAGNOSIS — N390 Urinary tract infection, site not specified: Secondary | ICD-10-CM | POA: Diagnosis present

## 2014-05-16 DIAGNOSIS — F419 Anxiety disorder, unspecified: Secondary | ICD-10-CM | POA: Insufficient documentation

## 2014-05-16 HISTORY — PX: CYSTOSCOPY W/ URETERAL STENT PLACEMENT: SHX1429

## 2014-05-16 LAB — CBC WITH DIFFERENTIAL/PLATELET
BASOS ABS: 0 10*3/uL (ref 0.0–0.1)
BASOS PCT: 0 % (ref 0–1)
EOS ABS: 0.1 10*3/uL (ref 0.0–0.7)
Eosinophils Relative: 1 % (ref 0–5)
HCT: 43.1 % (ref 36.0–46.0)
Hemoglobin: 14.3 g/dL (ref 12.0–15.0)
Lymphocytes Relative: 7 % — ABNORMAL LOW (ref 12–46)
Lymphs Abs: 1.2 10*3/uL (ref 0.7–4.0)
MCH: 30.2 pg (ref 26.0–34.0)
MCHC: 33.2 g/dL (ref 30.0–36.0)
MCV: 91.1 fL (ref 78.0–100.0)
MONOS PCT: 2 % — AB (ref 3–12)
Monocytes Absolute: 0.3 10*3/uL (ref 0.1–1.0)
NEUTROS ABS: 15.2 10*3/uL — AB (ref 1.7–7.7)
NEUTROS PCT: 91 % — AB (ref 43–77)
PLATELETS: 268 10*3/uL (ref 150–400)
RBC: 4.73 MIL/uL (ref 3.87–5.11)
RDW: 14 % (ref 11.5–15.5)
WBC: 16.7 10*3/uL — ABNORMAL HIGH (ref 4.0–10.5)

## 2014-05-16 LAB — COMPREHENSIVE METABOLIC PANEL
ALBUMIN: 3.6 g/dL (ref 3.5–5.2)
ALK PHOS: 55 U/L (ref 39–117)
ALT: 25 U/L (ref 0–35)
AST: 14 U/L (ref 0–37)
Anion gap: 8 (ref 5–15)
BILIRUBIN TOTAL: 0.7 mg/dL (ref 0.3–1.2)
BUN: 21 mg/dL (ref 6–23)
CALCIUM: 8.6 mg/dL (ref 8.4–10.5)
CO2: 23 mmol/L (ref 19–32)
Chloride: 109 mmol/L (ref 96–112)
Creatinine, Ser: 0.72 mg/dL (ref 0.50–1.10)
GFR calc Af Amer: >90 mL/min (ref >90)
GFR calc non Af Amer: >90 mL/min (ref >90)
GLUCOSE: 104 mg/dL — AB (ref 70–99)
POTASSIUM: 3.5 mmol/L (ref 3.5–5.1)
SODIUM: 140 mmol/L (ref 135–145)
TOTAL PROTEIN: 6.8 g/dL (ref 6.0–8.3)

## 2014-05-16 LAB — URINE MICROSCOPIC-ADD ON

## 2014-05-16 LAB — URINALYSIS, ROUTINE W REFLEX MICROSCOPIC
Bilirubin Urine: NEGATIVE
Glucose, UA: NEGATIVE mg/dL
Ketones, ur: NEGATIVE mg/dL
Nitrite: POSITIVE — AB
Protein, ur: NEGATIVE mg/dL
Specific Gravity, Urine: 1.021 (ref 1.005–1.030)
Urobilinogen, UA: 1 mg/dL (ref 0.0–1.0)
pH: 6 (ref 5.0–8.0)

## 2014-05-16 LAB — LIPASE, BLOOD: Lipase: 29 U/L (ref 11–59)

## 2014-05-16 SURGERY — CYSTOSCOPY, WITH RETROGRADE PYELOGRAM AND URETERAL STENT INSERTION
Anesthesia: General | Laterality: Left

## 2014-05-16 MED ORDER — MORPHINE SULFATE 4 MG/ML IJ SOLN
4.0000 mg | Freq: Once | INTRAMUSCULAR | Status: AC
  Administered 2014-05-16: 4 mg via INTRAVENOUS
  Filled 2014-05-16: qty 1

## 2014-05-16 MED ORDER — HYDROMORPHONE HCL 1 MG/ML IJ SOLN
1.0000 mg | Freq: Once | INTRAMUSCULAR | Status: AC
Start: 2014-05-16 — End: 2014-05-16
  Administered 2014-05-16: 1 mg via INTRAVENOUS
  Filled 2014-05-16: qty 1

## 2014-05-16 MED ORDER — SODIUM CHLORIDE 0.9 % IV BOLUS (SEPSIS)
1000.0000 mL | Freq: Once | INTRAVENOUS | Status: AC
  Administered 2014-05-16: 1000 mL via INTRAVENOUS

## 2014-05-16 MED ORDER — FENTANYL CITRATE 0.05 MG/ML IJ SOLN: 25.0000 ug | INTRAMUSCULAR | Status: DC | PRN

## 2014-05-16 MED ORDER — PROMETHAZINE HCL 25 MG/ML IJ SOLN: 6.2500 mg | INTRAMUSCULAR | Status: DC | PRN

## 2014-05-16 MED ORDER — PROMETHAZINE HCL 25 MG/ML IJ SOLN
6.2500 mg | INTRAMUSCULAR | Status: DC | PRN
  Administered 2014-05-16: 6.25 mg via INTRAVENOUS

## 2014-05-16 MED ORDER — SCOPOLAMINE 1 MG/3DAYS TD PT72
MEDICATED_PATCH | TRANSDERMAL | Status: AC
  Filled 2014-05-16: qty 1

## 2014-05-16 MED ORDER — OXYCODONE HCL 5 MG/5ML PO SOLN
5.0000 mg | Freq: Once | ORAL | Status: DC | PRN
  Filled 2014-05-16: qty 5

## 2014-05-16 MED ORDER — MIDAZOLAM HCL 2 MG/2ML IJ SOLN
INTRAMUSCULAR | Status: AC
  Filled 2014-05-16: qty 2

## 2014-05-16 MED ORDER — FENTANYL CITRATE 0.05 MG/ML IJ SOLN
INTRAMUSCULAR | Status: DC | PRN
  Administered 2014-05-16: 50 ug via INTRAVENOUS

## 2014-05-16 MED ORDER — IOHEXOL 300 MG/ML  SOLN
100.0000 mL | Freq: Once | INTRAMUSCULAR | Status: AC | PRN
  Administered 2014-05-16: 100 mL via INTRAVENOUS

## 2014-05-16 MED ORDER — ONDANSETRON HCL 4 MG/2ML IJ SOLN
4.0000 mg | Freq: Once | INTRAMUSCULAR | Status: AC
  Administered 2014-05-16: 4 mg via INTRAVENOUS
  Filled 2014-05-16: qty 2

## 2014-05-16 MED ORDER — DEXTROSE 5 % IV SOLN
1.0000 g | Freq: Once | INTRAVENOUS | Status: AC
  Administered 2014-05-16: 1 g via INTRAVENOUS
  Filled 2014-05-16: qty 10

## 2014-05-16 MED ORDER — FENTANYL CITRATE 0.05 MG/ML IJ SOLN
INTRAMUSCULAR | Status: AC
  Filled 2014-05-16: qty 2

## 2014-05-16 MED ORDER — BELLADONNA ALKALOIDS-OPIUM 16.2-60 MG RE SUPP
RECTAL | Status: DC | PRN
  Administered 2014-05-16: 1 via RECTAL

## 2014-05-16 MED ORDER — IOHEXOL 300 MG/ML  SOLN
50.0000 mL | Freq: Once | INTRAMUSCULAR | Status: AC | PRN
  Administered 2014-05-16: 50 mL via ORAL

## 2014-05-16 MED ORDER — ACETAMINOPHEN 325 MG PO TABS: 325.0000 mg | ORAL_TABLET | ORAL | Status: DC | PRN

## 2014-05-16 MED ORDER — OXYCODONE HCL 5 MG PO TABS: 5.0000 mg | ORAL_TABLET | Freq: Once | ORAL | Status: DC | PRN

## 2014-05-16 MED ORDER — SODIUM CHLORIDE 0.9 % IR SOLN
Status: DC | PRN
  Administered 2014-05-16: 3000 mL

## 2014-05-16 MED ORDER — TAMSULOSIN HCL 0.4 MG PO CAPS: 0.4000 mg | ORAL_CAPSULE | Freq: Every day | ORAL | Status: DC

## 2014-05-16 MED ORDER — TROSPIUM CHLORIDE ER 60 MG PO CP24: 60.0000 mg | ORAL_CAPSULE | Freq: Every day | ORAL | Status: DC

## 2014-05-16 MED ORDER — ACETAMINOPHEN 160 MG/5ML PO SOLN
325.0000 mg | ORAL | Status: DC | PRN
  Filled 2014-05-16: qty 20.3

## 2014-05-16 MED ORDER — LACTATED RINGERS IV SOLN
INTRAVENOUS | Status: DC
  Administered 2014-05-16: 1000 mL via INTRAVENOUS

## 2014-05-16 MED ORDER — MIDAZOLAM HCL 5 MG/5ML IJ SOLN
INTRAMUSCULAR | Status: DC | PRN
Start: 2014-05-16 — End: 2014-05-16
  Administered 2014-05-16: 2 mg via INTRAVENOUS

## 2014-05-16 MED ORDER — BELLADONNA ALKALOIDS-OPIUM 16.2-60 MG RE SUPP
RECTAL | Status: AC
  Filled 2014-05-16: qty 1

## 2014-05-16 MED ORDER — DEXAMETHASONE SODIUM PHOSPHATE 10 MG/ML IJ SOLN
INTRAMUSCULAR | Status: DC | PRN
  Administered 2014-05-16: 10 mg via INTRAVENOUS

## 2014-05-16 MED ORDER — CIPROFLOXACIN HCL 500 MG PO TABS
500.0000 mg | ORAL_TABLET | Freq: Two times a day (BID) | ORAL | Status: AC
Start: 2014-05-16 — End: 2014-05-23

## 2014-05-16 MED ORDER — IOHEXOL 300 MG/ML  SOLN
INTRAMUSCULAR | Status: DC | PRN
  Administered 2014-05-16: 3 mL

## 2014-05-16 MED ORDER — PROPOFOL 10 MG/ML IV BOLUS
INTRAVENOUS | Status: AC
  Filled 2014-05-16: qty 20

## 2014-05-16 MED ORDER — SCOPOLAMINE 1 MG/3DAYS TD PT72
MEDICATED_PATCH | TRANSDERMAL | Status: DC | PRN
  Administered 2014-05-16: 1 via TRANSDERMAL

## 2014-05-16 MED ORDER — SUCCINYLCHOLINE CHLORIDE 20 MG/ML IJ SOLN
INTRAMUSCULAR | Status: DC | PRN
  Administered 2014-05-16: 100 mg via INTRAVENOUS

## 2014-05-16 MED ORDER — PHENAZOPYRIDINE HCL 200 MG PO TABS: 200.0000 mg | ORAL_TABLET | Freq: Three times a day (TID) | ORAL | Status: DC | PRN

## 2014-05-16 MED ORDER — 0.9 % SODIUM CHLORIDE (POUR BTL) OPTIME
TOPICAL | Status: DC | PRN
  Administered 2014-05-16: 1000 mL

## 2014-05-16 MED ORDER — ACETAMINOPHEN-CODEINE #3 300-30 MG PO TABS: 1.0000 | ORAL_TABLET | ORAL | Status: DC | PRN

## 2014-05-16 MED ORDER — ONDANSETRON HCL 4 MG/2ML IJ SOLN
INTRAMUSCULAR | Status: DC | PRN
  Administered 2014-05-16: 4 mg via INTRAVENOUS

## 2014-05-16 MED ORDER — LIDOCAINE HCL (CARDIAC) 20 MG/ML IV SOLN
INTRAVENOUS | Status: AC
  Filled 2014-05-16: qty 5

## 2014-05-16 MED ORDER — PROPOFOL 10 MG/ML IV BOLUS
INTRAVENOUS | Status: DC | PRN
  Administered 2014-05-16: 150 mg via INTRAVENOUS

## 2014-05-16 MED ORDER — METOCLOPRAMIDE HCL 5 MG/ML IJ SOLN
10.0000 mg | Freq: Once | INTRAMUSCULAR | Status: AC
  Administered 2014-05-16: 10 mg via INTRAVENOUS
  Filled 2014-05-16: qty 2

## 2014-05-16 MED ORDER — PROMETHAZINE HCL 25 MG/ML IJ SOLN
INTRAMUSCULAR | Status: AC
  Filled 2014-05-16: qty 1

## 2014-05-16 MED ORDER — DEXAMETHASONE SODIUM PHOSPHATE 10 MG/ML IJ SOLN
INTRAMUSCULAR | Status: AC
  Filled 2014-05-16: qty 1

## 2014-05-16 SURGICAL SUPPLY — 6 items
CATH INTERMIT  6FR 70CM (CATHETERS) ×3 IMPLANT
GLOVE BIOGEL M STRL SZ7.5 (GLOVE) ×3 IMPLANT
GOWN PREVENTION PLUS XLARGE (GOWN DISPOSABLE) ×6 IMPLANT
GUIDEWIRE STR DUAL SENSOR (WIRE) ×3 IMPLANT
PACK CYSTO (CUSTOM PROCEDURE TRAY) ×3 IMPLANT
STENT CONTOUR 6FRX26X.038 (STENTS) ×3 IMPLANT

## 2014-05-16 NOTE — ED Notes (Signed)
Transported to OR

## 2014-05-16 NOTE — Consult Note (Signed)
Urology Consult   Physician requesting consult: [place]  Reason for consult: Nephrolithiasis  History of Present Illness: Darlene Patterson is a 44 y.o. female with PMH significant for PONV and  anxiety who presented to the ED with c/o severe left flank pain and N/V.  Her sx began around 4 am today and have been persistent.  She denies fevers but does think she has had chills.  She denies dysuria, hematuria, frequency, urgency, and incontinence.  CT scan shows 4 x 7mm left ureteral stone with proximal hydroureteronephrosis.  WBC is 16.7 and Cr 0.72.  UA is nitrite positive with many bacteria.  She has not had anything to eat or drink since approx 11:30pm last night.    She denies a history of voiding or storage urinary symptoms, hematuria, UTIs, urolithiasis, GU malignancy/trauma/surgery.  Past Medical History  Diagnosis Date  . Obesity   . Complication of anesthesia   . PONV (postoperative nausea and vomiting)     'SEVERE PER PT"  . Heart murmur     early teens- resolved  . Anxiety     with anesthesia  . H/O hiatal hernia     per upper GI 3/13  . Peripheral vascular disease     varicose veins- left    Past Surgical History  Procedure Laterality Date  . Cholecystectomy  2011  . Achilles tendon repair  08, 09    bilateral  . Cesarean section  89, 93  . Breath tek h pylori  05/21/2011    Procedure: BREATH TEK H PYLORI;  Surgeon: Valarie Merino, MD;  Location: [facility];  Service: General;  Laterality: N/A;  . Abdominal hysterectomy  3/13  . 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 Vascular surgery      left leg- s/p  injections of veins  . Laparoscopic gastric banding  10/07/2011    Procedure: LAPAROSCOPIC GASTRIC BANDING;  Surgeon: Valarie Merino, MD;  Location: [facility];  Service: General;  Laterality: N/A;  . Hiatal hernia repair  10/07/2011    Procedure: LAPAROSCOPIC REPAIR OF HIATAL HERNIA;  Surgeon: Valarie Merino, MD;  Location: [facility];  Service: General;   Laterality: N/A;     Current Hospital Medications:  Home meds:    Medication List    ASK your doctor about these medications        aspirin 81 MG tablet  Take 81 mg by mouth every morning.     cetirizine 10 MG tablet  Commonly known as:  ZYRTEC  Take 10 mg by mouth daily as needed for allergies.     FLAXSEED (LINSEED) PO  Take 1 tablet by mouth daily.     magnesium oxide 400 MG tablet  Commonly known as:  MAG-OX  Take 400 mg by mouth daily.     multivitamin-iron-minerals-folic acid chewable tablet  Chew 1 tablet by mouth every morning.     Vitamin B-12 500 MCG Subl  Place 1 tablet under the tongue every morning.        Scheduled Meds: Continuous Infusions: PRN Meds:.  Allergies:  Allergies  Allergen Reactions  . Carbon Dioxide Nausea And Vomiting    Pt experiences vomiting when having CO2 with anesthesia during surgeries.    Family History  Problem Relation Age of Onset  . Hypertension Sister   . COPD Mother     Social History:  reports that she has never smoked. She has never used smokeless tobacco. She reports that she  drinks alcohol. She reports that she does not use illicit drugs.  ROS: A complete review of systems was performed.  All systems are negative except for pertinent findings as noted.  Physical Exam:  Vital signs in last 24 hours: Temp:  [97.4 F (36.3 C)] 97.4 F (36.3 C) (03/08 0902) Pulse Rate:  [88-98] 91 (03/08 1400) Resp:  [13-17] 17 (03/08 1359) BP: (113-130)/(66-79) 113/74 mmHg (03/08 1400) SpO2:  [96 %-100 %] 97 % (03/08 1400) Constitutional:  Alert and oriented, pale  Cardiovascular: Regular rate and rhythm Respiratory: Normal respiratory effort GI: Abdomen is soft, nondistended, no abdominal masses; tender LLQ GU: No CVA tenderness Lymphatic: No lymphadenopathy Neurologic: Grossly intact, no focal deficits Psychiatric: Normal mood and affect  Laboratory Data:   Recent Labs  05/16/14 0904  WBC 16.7*  HGB 14.3   HCT 43.1  PLT 268     Recent Labs  05/16/14 0904  NA 140  K 3.5  CL 109  GLUCOSE 104*  BUN 21  CALCIUM 8.6  CREATININE 0.72     Results for orders placed or performed during the hospital encounter of 05/16/14 (from the past 24 hour(s))  CBC with Differential     Status: Abnormal   Collection Time: 05/16/14  9:04 AM  Result Value Ref Range   WBC 16.7 (H) 4.0 - 10.5 K/uL   RBC 4.73 3.87 - 5.11 MIL/uL   Hemoglobin 14.3 12.0 - 15.0 g/dL   HCT 96.043.1 45.436.0 - 09.846.0 %   MCV 91.1 78.0 - 100.0 fL   MCH 30.2 26.0 - 34.0 pg   MCHC 33.2 30.0 - 36.0 g/dL   RDW 11.914.0 14.711.5 - 82.915.5 %   Platelets 268 150 - 400 K/uL   Neutrophils Relative % 91 (H) 43 - 77 %   Neutro Abs 15.2 (H) 1.7 - 7.7 K/uL   Lymphocytes Relative 7 (L) 12 - 46 %   Lymphs Abs 1.2 0.7 - 4.0 K/uL   Monocytes Relative 2 (L) 3 - 12 %   Monocytes Absolute 0.3 0.1 - 1.0 K/uL   Eosinophils Relative 1 0 - 5 %   Eosinophils Absolute 0.1 0.0 - 0.7 K/uL   Basophils Relative 0 0 - 1 %   Basophils Absolute 0.0 0.0 - 0.1 K/uL  Comprehensive metabolic panel     Status: Abnormal   Collection Time: 05/16/14  9:04 AM  Result Value Ref Range   Sodium 140 135 - 145 mmol/L   Potassium 3.5 3.5 - 5.1 mmol/L   Chloride 109 96 - 112 mmol/L   CO2 23 19 - 32 mmol/L   Glucose, Bld 104 (H) 70 - 99 mg/dL   BUN 21 6 - 23 mg/dL   Creatinine, Ser 5.620.72 0.50 - 1.10 mg/dL   Calcium 8.6 8.4 - 13.010.5 mg/dL   Total Protein 6.8 6.0 - 8.3 g/dL   Albumin 3.6 3.5 - 5.2 g/dL   AST 14 0 - 37 U/L   ALT 25 0 - 35 U/L   Alkaline Phosphatase 55 39 - 117 U/L   Total Bilirubin 0.7 0.3 - 1.2 mg/dL   GFR calc non Af Amer >90 >90 mL/min   GFR calc Af Amer >90 >90 mL/min   Anion gap 8 5 - 15  Lipase, blood     Status: None   Collection Time: 05/16/14  9:04 AM  Result Value Ref Range   Lipase 29 11 - 59 U/L  Urinalysis, Routine w reflex microscopic     Status: Abnormal  Collection Time: 05/16/14 12:57 PM  Result Value Ref Range   Color, Urine YELLOW YELLOW    APPearance CLEAR CLEAR   Specific Gravity, Urine 1.021 1.005 - 1.030   pH 6.0 5.0 - 8.0   Glucose, UA NEGATIVE NEGATIVE mg/dL   Hgb urine dipstick LARGE (A) NEGATIVE   Bilirubin Urine NEGATIVE NEGATIVE   Ketones, ur NEGATIVE NEGATIVE mg/dL   Protein, ur NEGATIVE NEGATIVE mg/dL   Urobilinogen, UA 1.0 0.0 - 1.0 mg/dL   Nitrite POSITIVE (A) NEGATIVE   Leukocytes, UA TRACE (A) NEGATIVE  Urine microscopic-add on     Status: Abnormal   Collection Time: 05/16/14 12:57 PM  Result Value Ref Range   Squamous Epithelial / LPF RARE RARE   WBC, UA 0-2 <3 WBC/hpf   RBC / HPF 7-10 <3 RBC/hpf   Bacteria, UA MANY (A) RARE   Urine-Other MUCOUS PRESENT      Renal Function:  Recent Labs  05/16/14 0904  CREATININE 0.72   CrCl cannot be calculated (Unknown ideal weight.).  Radiologic Imaging: Ct Abdomen Pelvis W Contrast  05/16/2014   CLINICAL DATA:  44 year old female with left lower quadrant and flank pain. Nausea and vomiting. Onset today 0400 hrs. Initial encounter.  EXAM: CT ABDOMEN AND PELVIS WITH CONTRAST  TECHNIQUE: Multidetector CT imaging of the abdomen and pelvis was performed using the standard protocol following bolus administration of intravenous contrast.  CONTRAST:  100mL OMNIPAQUE IOHEXOL 300 MG/ML  SOLN  COMPARISON:  Post lap band upper GI 10/08/2011.  FINDINGS: Negative lung bases. Mild cardiomegaly. No pericardial or pleural effusion.  Degenerative changes in the lumbar spine. No acute osseous abnormality identified.  No pelvic free fluid. Uterus and adnexa within normal limits. Decompressed distal colon.  Occasional diverticula in the sigmoid colon and descending colon, no active inflammation. Negative transverse colon right colon and appendix. Oral contrast has not yet reached the distal small bowel. Negative terminal ileum. No dilated small bowel.  Sequelae of gastric banding. No adverse features identified. Contrast in the stomach and duodenum.  Surgically absent gallbladder.  Mild to moderate intra and extrahepatic biliary ductal enlargement likely is postoperative. Liver enhancement within normal limits. Spleen, pancreas and adrenal glands within normal limits. Portal venous system and major arterial structures within normal limits. No abdominal free fluid.  A symmetric renal enhancement and contrast excretion, with delayed nephrogram on the left. There is a triangular 4 x 7 mm calculus in the proximal left ureter about 2 cm distal to the left UPJ. Left hydronephrosis and proximal left hydroureter with periureteral stranding. Distal to the calculus the left ureter is unremarkable to the bladder. Negative right ureter. Unremarkable bladder. No lymphadenopathy.  IMPRESSION: 1. Acute obstructive uropathy on the left with a triangular 4 x 7 mm proximal left ureteral calculus 2 cm distal to the left UPJ. 2. No other acute findings in the abdomen or pelvis.   Electronically Signed   By: Odessa FlemingH  Hall M.D.   On: 05/16/2014 11:57    Impression/Recommendation: Left nephrolithiasis with proximal hydro--pt has persistent pain with N/V unrelieved by IV medications as well as an elevated WBC with a UTI, therefore, will schedule for left ureteral stent today by Dr. Marlou PorchHerrick.  Pt received Rocephin in the ED.  Keep NPO and obtain consent.  UTI--send urine for culture.    She will be discharged home later with PO ABx, flomax, pain medication, anti-emetic, and anticholinergic.     Rowdy Guerrini 05/16/2014, 3:29 PM

## 2014-05-16 NOTE — ED Provider Notes (Signed)
CSN: [id number]     Arrival date & time 05/16/14  4010 History   First MD Initiated Contact with Patient 05/16/14 [phone]     Chief Complaint  Patient presents with  . Abdominal Pain     (Consider location/radiation/quality/duration/timing/severity/associated sxs/prior Treatment) Patient is a 44 y.o. female presenting with abdominal pain.  Abdominal Pain Pain location:  LLQ Pain quality comment:  Twisting Pain radiates to:  Does not radiate Pain severity:  Severe Onset quality:  Sudden Duration:  6 hours Timing:  Constant Progression:  Waxing and waning Chronicity:  New Context: awakening from sleep   Relieved by:  Nothing Worsened by:  Nothing tried Associated symptoms: nausea and vomiting   Associated symptoms: no constipation, no diarrhea, no dysuria and no hematuria     Past Medical History  Diagnosis Date  . Obesity   . Complication of anesthesia   . PONV (postoperative nausea and vomiting)     'SEVERE PER PT"  . Heart murmur     early teens- resolved  . Anxiety     with anesthesia  . H/O hiatal hernia     per upper GI 3/13  . Peripheral vascular disease     varicose veins- left   Past Surgical History  Procedure Laterality Date  . Cholecystectomy  2011  . Achilles tendon repair  08, 09    bilateral  . Cesarean section  89, 89  . Breath tek h pylori  05/21/2011    Procedure: BREATH TEK H PYLORI;  Surgeon: Valarie Merino, MD;  Location: [facility];  Service: General;  Laterality: N/A;  . Abdominal hysterectomy  3/13  . 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 Vascular surgery      left leg- s/p  injections of veins  . Laparoscopic gastric banding  10/07/2011    Procedure: LAPAROSCOPIC GASTRIC BANDING;  Surgeon: Valarie Merino, MD;  Location: [facility];  Service: General;  Laterality: N/A;  . Hiatal hernia repair  10/07/2011    Procedure: LAPAROSCOPIC REPAIR OF HIATAL HERNIA;  Surgeon: Valarie Merino, MD;  Location: [facility];  Service: General;  Laterality:  N/A;   Family History  Problem Relation Age of Onset  . Hypertension Sister   . COPD Mother    History  Substance Use Topics  . Smoking status: Never Smoker   . Smokeless tobacco: Never Used  . Alcohol Use: Yes     Comment: 2-3 beers on 2 weekends/month   OB History    No data available     Review of Systems  Gastrointestinal: Positive for nausea, vomiting and abdominal pain. Negative for diarrhea and constipation.  Genitourinary: Negative for dysuria and hematuria.  All other systems reviewed and are negative.     Allergies  Carbon dioxide  Home Medications   Prior to Admission medications   Medication Sig Start Date End Date Taking? Authorizing Provider  aspirin 81 MG tablet Take 81 mg by mouth every morning.     Historical Provider, MD  cetirizine (ZYRTEC) 10 MG tablet Take 10 mg by mouth daily as needed for allergies.     Historical Provider, MD  Cyanocobalamin (VITAMIN B-12) 500 MCG SUBL Place 1 tablet under the tongue every morning.    Historical Provider, MD  FLAXSEED, LINSEED, PO Take by mouth.    Historical Provider, MD  multivitamin-iron-minerals-folic acid (CENTRUM) chewable tablet Chew 1 tablet by mouth every morning.     Historical Provider, MD  Zinc 30  MG CAPS Take 30 mg by mouth every other day.    Historical Provider, MD   BP 127/74 mmHg  Pulse 93  Temp(Src) 97.4 F (36.3 C)  Resp 16  SpO2 97%  LMP 06/01/2011 Physical Exam  Constitutional: She is oriented to person, place, and time. She appears well-developed and well-nourished. No distress.  HENT:  Head: Normocephalic and atraumatic.  Mouth/Throat: Oropharynx is clear and moist.  Eyes: Conjunctivae are normal. Pupils are equal, round, and reactive to light. No scleral icterus.  Neck: Neck supple.  Cardiovascular: Normal rate, regular rhythm, normal heart sounds and intact distal pulses.   No murmur heard. Pulmonary/Chest: Effort normal and breath sounds normal. No stridor. No respiratory  distress. She has no rales.  Abdominal: Soft. Bowel sounds are normal. She exhibits no distension. There is tenderness in the left lower quadrant. There is no rigidity, no rebound and no guarding.  Musculoskeletal: Normal range of motion.  Neurological: She is alert and oriented to person, place, and time.  Skin: Skin is warm and dry. No rash noted.  Psychiatric: She has a normal mood and affect. Her behavior is normal.  Nursing note and vitals reviewed.   ED Course  Procedures (including critical care time) Labs Review Labs Reviewed  CBC WITH DIFFERENTIAL/PLATELET - Abnormal; Notable for the following:    WBC 16.7 (*)    Neutrophils Relative % 91 (*)    Neutro Abs 15.2 (*)    Lymphocytes Relative 7 (*)    Monocytes Relative 2 (*)    All other components within normal limits  COMPREHENSIVE METABOLIC PANEL - Abnormal; Notable for the following:    Glucose, Bld 104 (*)    All other components within normal limits  URINALYSIS, ROUTINE W REFLEX MICROSCOPIC - Abnormal; Notable for the following:    Hgb urine dipstick LARGE (*)    Nitrite POSITIVE (*)    Leukocytes, UA TRACE (*)    All other components within normal limits  URINE MICROSCOPIC-ADD ON - Abnormal; Notable for the following:    Bacteria, UA MANY (*)    All other components within normal limits  URINE CULTURE  LIPASE, BLOOD    Imaging Review Ct Abdomen Pelvis W Contrast  05/16/2014   CLINICAL DATA:  44 year old female with left lower quadrant and flank pain. Nausea and vomiting. Onset today 0400 hrs. Initial encounter.  EXAM: CT ABDOMEN AND PELVIS WITH CONTRAST  TECHNIQUE: Multidetector CT imaging of the abdomen and pelvis was performed using the standard protocol following bolus administration of intravenous contrast.  CONTRAST:  OMNIPAQUE IOHEXOL 300 MG/ML  SOLN  COMPARISON:  Post lap band upper GI 10/08/2011.  FINDINGS: Negative lung bases. Mild cardiomegaly. No pericardial or pleural effusion.  Degenerative changes  in the lumbar spine. No acute osseous abnormality identified.  No pelvic free fluid. Uterus and adnexa within normal limits. Decompressed distal colon.  Occasional diverticula in the sigmoid colon and descending colon, no active inflammation. Negative transverse colon right colon and appendix. Oral contrast has not yet reached the distal small bowel. Negative terminal ileum. No dilated small bowel.  Sequelae of gastric banding. No adverse features identified. Contrast in the stomach and duodenum.  Surgically absent gallbladder. Mild to moderate intra and extrahepatic biliary ductal enlargement likely is postoperative. Liver enhancement within normal limits. Spleen, pancreas and adrenal glands within normal limits. Portal venous system and major arterial structures within normal limits. No abdominal free fluid.  A symmetric renal enhancement and contrast excretion, with delayed nephrogram on  the left. There is a triangular 4 x 7 mm calculus in the proximal left ureter about 2 cm distal to the left UPJ. Left hydronephrosis and proximal left hydroureter with periureteral stranding. Distal to the calculus the left ureter is unremarkable to the bladder. Negative right ureter. Unremarkable bladder. No lymphadenopathy.  IMPRESSION: 1. Acute obstructive uropathy on the left with a triangular 4 x 7 mm proximal left ureteral calculus 2 cm distal to the left UPJ. 2. No other acute findings in the abdomen or pelvis.   Electronically Signed   By: Odessa FlemingH  Hall M.D.   On: 05/16/2014 11:57  All radiology studies independently viewed by me.      EKG Interpretation None      MDM   Final diagnoses:  LLQ abdominal pain  Left ureteral stone  Urinary tract infection with hematuria, site unspecified    44 yo female with sudden onset LLQ pain . CT shows large obstructing ureteral stone . UA positive for infection.  Pain difficult to control despite multiple doses of IV narcotics.  I discussed her case with Dr. Marlou PorchHerrick, who will  eval in the ED.    She will be taken to the OR by Urology.   Blake DivineJohn Basil Buffin, MD 05/16/14 [phone]

## 2014-05-16 NOTE — Anesthesia Postprocedure Evaluation (Signed)
  Anesthesia Post-op Note  Patient: Darlene Patterson  Procedure(s) Performed: Procedure(s): CYSTOSCOPY WITH RETROGRADE PYELOGRAM/URETERAL STENT PLACEMENT (Left)  Patient Location: PACU  Anesthesia Type:General  Level of Consciousness: awake  Airway and Oxygen Therapy: Patient Spontanous Breathing  Post-op Pain: none  Post-op Assessment: Post-op Vital signs reviewed, Patient's Cardiovascular Status Stable, Respiratory Function Stable, Patent Airway, No signs of Nausea or vomiting and Pain level controlled  Post-op Vital Signs: Reviewed and stable  Last Vitals:  Filed Vitals:   05/16/14 1825  BP: 115/75  Pulse: 67  Temp: 36.5 C  Resp: 16    Complications: No apparent anesthesia complications

## 2014-05-16 NOTE — ED Notes (Signed)
Patient is unable to urinate at this time, she will call when she can go

## 2014-05-16 NOTE — Anesthesia Procedure Notes (Signed)
Procedure Name: Intubation Date/Time: 05/16/2014 5:12 PM Performed by: Early OsmondEARGLE, Kayven Aldaco E Pre-anesthesia Checklist: Patient identified, Emergency Drugs available, Suction available and Patient being monitored Patient Re-evaluated:Patient Re-evaluated prior to inductionOxygen Delivery Method: Circle System Utilized Preoxygenation: Pre-oxygenation with 100% oxygen Intubation Type: IV induction, Rapid sequence and Cricoid Pressure applied Ventilation: Mask ventilation without difficulty Laryngoscope Size: Mac and 4 Grade View: Grade I Tube type: Oral Tube size: 7.0 mm Number of attempts: 1 Airway Equipment and Method: Stylet and Oral airway Placement Confirmation: ETT inserted through vocal cords under direct vision,  positive ETCO2 and breath sounds checked- equal and bilateral Secured at: 21 cm Tube secured with: Tape Dental Injury: Teeth and Oropharynx as per pre-operative assessment

## 2014-05-16 NOTE — Op Note (Signed)
Preoperative diagnosis:  1. Infected left ureteral stone   Postoperative diagnosis:  1. As above   Procedure:  1. Cystoscopy 2. left ureteral stent placement 3. left retrograde pyelography with interpretation   Surgeon: Crist FatBenjamin W. Makenli Derstine, MD  Anesthesia: General  Complications: None  Intraoperative findings: The patient had a normal caliber left ureter up to the level of the proximal ureter/UPJ where there was a filling defect and proximal hydronephrosis. A 26 cm 6 French double-J stent was placed without difficulty.  EBL: Minimal  Specimens: None  Indication: Darlene Patterson is a 44 y.o. patient with an infected left proximal ureteral stone. After reviewing the management options for treatment, he elected to proceed with the above surgical procedure(s). We have discussed the potential benefits and risks of the procedure, side effects of the proposed treatment, the likelihood of the patient achieving the goals of the procedure, and any potential problems that might occur during the procedure or recuperation. Informed consent has been obtained.  Description of procedure:  The patient was taken to the operating room and general anesthesia was induced.  The patient was placed in the dorsal lithotomy position, prepped and draped in the usual sterile fashion, and preoperative antibiotics were administered. A preoperative time-out was performed.   Cystourethroscopy was performed.  The patient's urethra was examined and was normal. The bladder was then systematically examined in its entirety. There was no evidence for any bladder tumors, stones, or other mucosal pathology.    Attention then turned to the leftureteral orifice and a ureteral catheter was used to intubate the ureteral orifice.  Omnipaque contrast was injected through the ureteral catheter and a retrograde pyelogram was performed with findings as dictated above.  A 0.38 sensor guidewire was then advanced up the left ureter  into the renal pelvis under fluoroscopic guidance.  The wire was then backloaded through the cystoscope and a ureteral stent was advance over the wire using Seldinger technique.  The stent was positioned appropriately under fluoroscopic and cystoscopic guidance.  The wire was then removed with an adequate stent curl noted in the renal pelvis as well as in the bladder.  The bladder was then emptied and the procedure ended.  The patient appeared to tolerate the procedure well and without complications.  The patient was able to be awakened and transferred to the recovery unit in satisfactory condition.    Crist FatBenjamin W. Nayla Dias, M.D.

## 2014-05-16 NOTE — Transfer of Care (Signed)
Immediate Anesthesia Transfer of Care Note  Patient: Darlene Patterson  Procedure(s) Performed: Procedure(s) (LRB): CYSTOSCOPY WITH RETROGRADE PYELOGRAM/URETERAL STENT PLACEMENT (Left)  Patient Location: PACU  Anesthesia Type: General  Level of Consciousness: sedated, patient cooperative and responds to stimulation  Airway & Oxygen Therapy: Patient Spontanous Breathing and Patient connected to face mask oxgen  Post-op Assessment: Report given to PACU RN and Post -op Vital signs reviewed and stable  Post vital signs: Reviewed and stable  Complications: No apparent anesthesia complications

## 2014-05-16 NOTE — ED Notes (Signed)
Pt aware of need for urine specimen. Pt states she is unable to void at this time. Pt instructed to inform staff when she is able to urinate.

## 2014-05-16 NOTE — Discharge Instructions (Signed)
DISCHARGE INSTRUCTIONS FOR KIDNEY STONE/URETERAL STENT   MEDICATIONS:  1.  Resume all your other meds from home - except do not take any extra narcotic pain meds that you may have at home.  2. Trospium is to prevent bladder spasms and help reduce urinary frequency. 3. Pyridium is to help with the burning/stinging when you urinate. 4. Tylenol with codeine is for moderate/severe pain, otherwise taking upto 1000mg  every 6 hours of plainTylenol will help treat your pain.  Do not take both at the same time. 5. Take Cipro for one week 6. Tamsulosin is used to help with stent discomfort and spasm.  ACTIVITY:  1. No strenuous activity x 1week  2. No driving while on narcotic pain medications  3. Drink plenty of water  4. Continue to walk at home - you can still get blood clots when you are at home, so keep active, but don't over do it.  5. May return to work/school tomorrow or when you feel ready   BATHING:  1. You can shower and we recommend daily showers    SIGNS/SYMPTOMS TO CALL:  Please call us if you have a fever greater than 101.5, uncontrolled nausea/vomiting, uncontrolled pain, dizziness, unable to urinate, bloody urine, chest pain, shortness of breath, leg swelling, leg pain, redness around wound, drainage from wound, or any other concerns or questions.   You can reach us at [phone].   FOLLOW-UP:  1. We will schedule you for follow-up ureteroscopy and removal of her stone in about 2 weeks.      General Anesthesia, Care After Refer to this sheet in the next few weeks. These instructions provide you with information on caring for yourself after your procedure. Your health care provider may also give you more specific instructions. Your treatment has been planned according to current medical practices, but problems sometimes occur. Call your health care provider if you have any problems or questions after your procedure. WHAT TO EXPECT AFTER THE PROCEDURE After the procedure,  it is typical to experience:  Sleepiness.  Nausea and vomiting. HOME CARE INSTRUCTIONS  For the first 24 hours after general anesthesia:  Have a responsible person with you.  Do not drive a car. If you are alone, do not take public transportation.  Do not drink alcohol.  Do not take medicine that has not been prescribed by your health care provider.  Do not sign important papers or make important decisions.  You may resume a normal diet and activities as directed by your health care provider.  Change bandages (dressings) as directed.  If you have questions or problems that seem related to general anesthesia, call the hospital and ask for the anesthetist or anesthesiologist on call. SEEK MEDICAL CARE IF:  You have nausea and vomiting that continue the day after anesthesia.  You develop a rash. SEEK IMMEDIATE MEDICAL CARE IF:   You have difficulty breathing.  You have chest pain.  You have any allergic problems. Document Released: 06/02/2000 Document Revised: 03/01/2013 Document Reviewed: 09/09/2012 [facility] Patient Information 2015 [employer]. This information is not intended to replace advice given to you by your health care provider. Make sure you discuss any questions you have with your health care provider.

## 2014-05-16 NOTE — ED Notes (Addendum)
Pt c/o abdominal pain, nausea, and emesis onset today. LLQ rebound tenderness present. Hx of cholecystectomy, hysterectomy, and lap band.

## 2014-05-16 NOTE — Anesthesia Preprocedure Evaluation (Signed)
Anesthesia Evaluation  Patient identified by MRN, date of birth, ID band Patient awake    Reviewed: Allergy & Precautions, NPO status , Patient's Chart, lab work & pertinent test results  History of Anesthesia Complications (+) PONV and history of anesthetic complications  Airway Mallampati: II  TM Distance: >3 FB Neck ROM: Full    Dental  (+) Teeth Intact   Pulmonary neg pulmonary ROS,  breath sounds clear to auscultation        Cardiovascular negative cardio ROS  Rhythm:Regular     Neuro/Psych PSYCHIATRIC DISORDERS Anxiety negative neurological ROS     GI/Hepatic Neg liver ROS, hiatal hernia,   Endo/Other  Morbid obesity  Renal/GU Kidney stone with obstruction     Musculoskeletal   Abdominal   Peds  Hematology   Anesthesia Other Findings   Reproductive/Obstetrics                             Anesthesia Physical Anesthesia Plan  ASA: II and emergent  Anesthesia Plan: General   Post-op Pain Management:    Induction: Intravenous  Airway Management Planned: Oral ETT  Additional Equipment: None  Intra-op Plan:   Post-operative Plan: Extubation in OR  Informed Consent: I have reviewed the patients History and Physical, chart, labs and discussed the procedure including the risks, benefits and alternatives for the proposed anesthesia with the patient or authorized representative who has indicated his/her understanding and acceptance.   Dental advisory given  Plan Discussed with: CRNA and Surgeon  Anesthesia Plan Comments:         Anesthesia Quick Evaluation

## 2014-05-17 ENCOUNTER — Encounter (HOSPITAL_COMMUNITY): Payer: Self-pay | Admitting: Urology

## 2014-05-19 LAB — URINE CULTURE

## 2014-05-22 ENCOUNTER — Other Ambulatory Visit: Payer: Self-pay | Admitting: Urology

## 2014-05-22 NOTE — Progress Notes (Signed)
Called to Dr. Jasmine AweHerrick's office requested release of orders to Epic in sign and held surgery 05-31-14 pre op 05-23-14 Thanks

## 2014-05-22 NOTE — Progress Notes (Signed)
CBC with diff 05/16/14 on EPIC=abnormal, will repeat, CMET 05/16/14 on EPIC, CT abdomen pelvis 05/16/14 on EPIC

## 2014-05-23 ENCOUNTER — Encounter (HOSPITAL_COMMUNITY)
Admission: RE | Admit: 2014-05-23 | Discharge: 2014-05-23 | Disposition: A | Discharge status: Home / Self Care | Payer: 59 | Source: Ambulatory Visit | Attending: Urology | Admitting: Urology

## 2014-05-23 ENCOUNTER — Encounter (HOSPITAL_COMMUNITY): Payer: Self-pay

## 2014-05-23 DIAGNOSIS — Z01818 Encounter for other preprocedural examination: Secondary | ICD-10-CM | POA: Insufficient documentation

## 2014-05-23 HISTORY — DX: Calculus of kidney: N20.0

## 2014-05-23 HISTORY — DX: Bariatric surgery status: Z98.84

## 2014-05-23 HISTORY — DX: Pain in unspecified toe(s): M79.676

## 2014-05-23 NOTE — Pre-Procedure Instructions (Addendum)
05-23-14 CMP-05-16-14 Epic. Labs of 05-22-14 requested.--Now received placed CBC/d report with chart [facility])

## 2014-05-23 NOTE — Patient Instructions (Addendum)
20 Darlene BrasMary A Morken  05/23/2014   Your procedure is scheduled on:   05-31-2014 Wednesday  [facility] Arrive at  0530      AM.  Call this number if you have problems the morning of surgery: [phone]  Or Presurgical Testing [phone].   For Living Will and/or Health Care Power Attorney Forms: please provide copy for your medical record,may bring AM of surgery(Forms should be already notarized -we do not provide this service).(3-15- 16  No information preferred today).  Remember: Follow any bowel prep instructions per MD office.   Do not eat food/ or drink: After Midnight.      Take these medicines the morning of surgery with A SIP OF WATER: Zyrtec. Cipro(if applies).   Do not wear jewelry, make-up or nail polish.  Do not wear deodorant, lotions, powders, or perfumes.   Do not shave legs and under arms- 48 hours(2 days) prior to first CHG shower.(Shaving face and neck okay.)  Do not bring valuables to the hospital.(Hospital is not responsible for lost valuables).  Contacts, dentures or removable bridgework, body piercing, hair pins may not be worn into surgery.  Leave suitcase in the car. After surgery it may be brought to your room.  For patients admitted to the hospital, checkout time is 11:00 AM the day of discharge.(Restricted visitors-Any Persons displaying flu-like symptoms or illness).    Patients discharged the day of surgery will not be allowed to drive home. Must have responsible person with you x 24 hours once discharged.  Name and phone number of your driver: Chrissie NoaWilliam- spouse [phone]- [phone] cell     Please read over the following fact sheets that you were given:  CHG(Chlorhexidine Gluconate 4% Surgical Soap) use.             [place] - Preparing for Surgery Before surgery, you can play an important role.  Because skin is not sterile, your skin needs to be as free of germs as possible.  You can  reduce the number of germs on your skin by washing with CHG (chlorahexidine gluconate) soap before surgery.  CHG is an antiseptic cleaner which kills germs and bonds with the skin to continue killing germs even after washing. Please DO NOT use if you have an allergy to CHG or antibacterial soaps.  If your skin becomes reddened/irritated stop using the CHG and inform your nurse when you arrive at Short Stay. Do not shave (including legs and underarms) for at least 48 hours prior to the first CHG shower.  You may shave your face/neck. Please follow these instructions carefully:  1.  Shower with CHG Soap the night before surgery and the  morning of Surgery.  2.  If you choose to wash your hair, wash your hair first as usual with your  normal  shampoo.  3.  After you shampoo, rinse your hair and body thoroughly to remove the  shampoo.                           4.  Use CHG as you would any other liquid soap.  You can apply chg directly  to the skin and wash                       Gently with a scrungie or clean washcloth.  5.  Apply the CHG Soap to your body  ONLY FROM THE NECK DOWN.   Do not use on face/ open                           Wound or open sores. Avoid contact with eyes, ears mouth and genitals (private parts).                       Wash face,  Genitals (private parts) with your normal soap.             6.  Wash thoroughly, paying special attention to the area where your surgery  will be performed.  7.  Thoroughly rinse your body with warm water from the neck down.  8.  DO NOT shower/wash with your normal soap after using and rinsing off  the CHG Soap.                9.  Pat yourself dry with a clean towel.            10.  Wear clean pajamas.            11.  Place clean sheets on your bed the night of your first shower and do not  sleep with pets. Day of Surgery : Do not apply any lotions/deodorants the morning of surgery.  Please wear clean clothes to the hospital/surgery center.  FAILURE TO  FOLLOW THESE INSTRUCTIONS MAY RESULT IN THE CANCELLATION OF YOUR SURGERY PATIENT SIGNATURE_________________________________  NURSE SIGNATURE__________________________________  ________________________________________________________________________

## 2014-05-30 NOTE — Interval H&P Note (Signed)
History and Physical Interval Note: Patient underwent stent placement for left obstructing ureteral stone. She subsequently was treated with 7 days of antibiotics. A repeat urine culture was negative. She presents today for left ureteral stone didn't exchange, left ureteroscopy stone removal. There are no significant changes.  05/30/2014 9:41 PM  Darlene BlinksMary A Coderre  has presented today for surgery, with the diagnosis of LEFT URETERAL STONE   The various methods of treatment have been discussed with the patient and family. After consideration of risks, benefits and other options for treatment, the patient has consented to  Procedure(s): LEFT URETEROSCOPY AND STENT EXCHANGE (Left) HOLMIUM LASER LITHOTRIPSY  (Left) as a surgical intervention .  The patient's history has been reviewed, patient examined, no change in status, stable for surgery.  I have reviewed the patient's chart and labs.  Questions were answered to the patient's satisfaction.     Berniece SalinesHERRICK, [place] W

## 2014-05-30 NOTE — H&P (View-Only) (Signed)
Urology Consult   Physician requesting consult: [place]  Reason for consult: Nephrolithiasis  History of Present Illness: Darlene Patterson is a 44 y.o. female with PMH significant for PONV and  anxiety who presented to the ED with c/o severe left flank pain and N/V.  Her sx began around 4 am today and have been persistent.  She denies fevers but does think she has had chills.  She denies dysuria, hematuria, frequency, urgency, and incontinence.  CT scan shows 4 x 7mm left ureteral stone with proximal hydroureteronephrosis.  WBC is 16.7 and Cr 0.72.  UA is nitrite positive with many bacteria.  She has not had anything to eat or drink since approx 11:30pm last night.    She denies a history of voiding or storage urinary symptoms, hematuria, UTIs, urolithiasis, GU malignancy/trauma/surgery.  Past Medical History  Diagnosis Date  . Obesity   . Complication of anesthesia   . PONV (postoperative nausea and vomiting)     'SEVERE PER PT"  . Heart murmur     early teens- resolved  . Anxiety     with anesthesia  . H/O hiatal hernia     per upper GI 3/13  . Peripheral vascular disease     varicose veins- left    Past Surgical History  Procedure Laterality Date  . Cholecystectomy  2011  . Achilles tendon repair  08, 09    bilateral  . Cesarean section  89, 89  . Breath tek h pylori  05/21/2011    Procedure: BREATH TEK H PYLORI;  Surgeon: Matthew B Martin, MD;  Location: [facility] END[facility]COPY;  Service: General;  Laterality: N/A;  . Abdominal hysterectomy  3/13  . 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 Vascular surgery      left leg- s/p  injections of veins  . Laparoscopic gastric banding  10/07/2011    Procedure: LAPAROSCOPIC GASTRIC BANDING;  Surgeon: Matthew B Martin, MD;  Location: [facility] ORS;  Service: General;  Laterality: N/A;  . Hiatal hernia repair  10/07/2011    Procedure: LAPAROSCOPIC REPAIR OF HIATAL HERNIA;  Surgeon: Matthew B Martin, MD;  Location: [facility] ORS;  Service: General;   Laterality: N/A;     Current Hospital Medications:  Home meds:    Medication List    ASK your doctor about these medications        aspirin 81 MG tablet  Take 81 mg by mouth every morning.     cetirizine 10 MG tablet  Commonly known as:  ZYRTEC  Take 10 mg by mouth daily as needed for allergies.     FLAXSEED (LINSEED) PO  Take 1 tablet by mouth daily.     magnesium oxide 400 MG tablet  Commonly known as:  MAG-OX  Take 400 mg by mouth daily.     multivitamin-iron-minerals-folic acid chewable tablet  Chew 1 tablet by mouth every morning.     Vitamin B-12 500 MCG Subl  Place 1 tablet under the tongue every morning.        Scheduled Meds: Continuous Infusions: PRN Meds:.  Allergies:  Allergies  Allergen Reactions  . Carbon Dioxide Nausea And Vomiting    Pt experiences vomiting when having CO2 with anesthesia during surgeries.    Family History  Problem Relation Age of Onset  . Hypertension Sister   . COPD Mother     Social History:  reports that she has never smoked. She has never used smokeless tobacco. She reports that she   drinks alcohol. She reports that she does not use illicit drugs.  ROS: A complete review of systems was performed.  All systems are negative except for pertinent findings as noted.  Physical Exam:  Vital signs in last 24 hours: Temp:  [97.4 F (36.3 C)] 97.4 F (36.3 C) (03/08 0902) Pulse Rate:  [88-98] 91 (03/08 1400) Resp:  [13-17] 17 (03/08 1359) BP: (113-130)/(66-79) 113/74 mmHg (03/08 1400) SpO2:  [96 %-100 %] 97 % (03/08 1400) Constitutional:  Alert and oriented, pale  Cardiovascular: Regular rate and rhythm Respiratory: Normal respiratory effort GI: Abdomen is soft, nondistended, no abdominal masses; tender LLQ GU: No CVA tenderness Lymphatic: No lymphadenopathy Neurologic: Grossly intact, no focal deficits Psychiatric: Normal mood and affect  Laboratory Data:   Recent Labs  05/16/14 0904  WBC 16.7*  HGB 14.3   HCT 43.1  PLT 268     Recent Labs  05/16/14 0904  NA 140  K 3.5  CL 109  GLUCOSE 104*  BUN 21  CALCIUM 8.6  CREATININE 0.72     Results for orders placed or performed during the hospital encounter of 05/16/14 (from the past 24 hour(s))  CBC with Differential     Status: Abnormal   Collection Time: 05/16/14  9:04 AM  Result Value Ref Range   WBC 16.7 (H) 4.0 - 10.5 K/uL   RBC 4.73 3.87 - 5.11 MIL/uL   Hemoglobin 14.3 12.0 - 15.0 g/dL   HCT 43.1 36.0 - 46.0 %   MCV 91.1 78.0 - 100.0 fL   MCH 30.2 26.0 - 34.0 pg   MCHC 33.2 30.0 - 36.0 g/dL   RDW 14.0 11.5 - 15.5 %   Platelets 268 150 - 400 K/uL   Neutrophils Relative % 91 (H) 43 - 77 %   Neutro Abs 15.2 (H) 1.7 - 7.7 K/uL   Lymphocytes Relative 7 (L) 12 - 46 %   Lymphs Abs 1.2 0.7 - 4.0 K/uL   Monocytes Relative 2 (L) 3 - 12 %   Monocytes Absolute 0.3 0.1 - 1.0 K/uL   Eosinophils Relative 1 0 - 5 %   Eosinophils Absolute 0.1 0.0 - 0.7 K/uL   Basophils Relative 0 0 - 1 %   Basophils Absolute 0.0 0.0 - 0.1 K/uL  Comprehensive metabolic panel     Status: Abnormal   Collection Time: 05/16/14  9:04 AM  Result Value Ref Range   Sodium 140 135 - 145 mmol/L   Potassium 3.5 3.5 - 5.1 mmol/L   Chloride 109 96 - 112 mmol/L   CO2 23 19 - 32 mmol/L   Glucose, Bld 104 (H) 70 - 99 mg/dL   BUN 21 6 - 23 mg/dL   Creatinine, Ser 0.72 0.50 - 1.10 mg/dL   Calcium 8.6 8.4 - 10.5 mg/dL   Total Protein 6.8 6.0 - 8.3 g/dL   Albumin 3.6 3.5 - 5.2 g/dL   AST 14 0 - 37 U/L   ALT 25 0 - 35 U/L   Alkaline Phosphatase 55 39 - 117 U/L   Total Bilirubin 0.7 0.3 - 1.2 mg/dL   GFR calc non Af Amer >90 >90 mL/min   GFR calc Af Amer >90 >90 mL/min   Anion gap 8 5 - 15  Lipase, blood     Status: None   Collection Time: 05/16/14  9:04 AM  Result Value Ref Range   Lipase 29 11 - 59 U/L  Urinalysis, Routine w reflex microscopic     Status: Abnormal     Collection Time: 05/16/14 12:57 PM  Result Value Ref Range   Color, Urine YELLOW YELLOW    APPearance CLEAR CLEAR   Specific Gravity, Urine 1.021 1.005 - 1.030   pH 6.0 5.0 - 8.0   Glucose, UA NEGATIVE NEGATIVE mg/dL   Hgb urine dipstick LARGE (A) NEGATIVE   Bilirubin Urine NEGATIVE NEGATIVE   Ketones, ur NEGATIVE NEGATIVE mg/dL   Protein, ur NEGATIVE NEGATIVE mg/dL   Urobilinogen, UA 1.0 0.0 - 1.0 mg/dL   Nitrite POSITIVE (A) NEGATIVE   Leukocytes, UA TRACE (A) NEGATIVE  Urine microscopic-add on     Status: Abnormal   Collection Time: 05/16/14 12:57 PM  Result Value Ref Range   Squamous Epithelial / LPF RARE RARE   WBC, UA 0-2 <3 WBC/hpf   RBC / HPF 7-10 <3 RBC/hpf   Bacteria, UA MANY (A) RARE   Urine-Other MUCOUS PRESENT      Renal Function:  Recent Labs  05/16/14 0904  CREATININE 0.72   CrCl cannot be calculated (Unknown ideal weight.).  Radiologic Imaging: Ct Abdomen Pelvis W Contrast  05/16/2014   CLINICAL DATA:  43-year-old female with left lower quadrant and flank pain. Nausea and vomiting. Onset today 0400 hrs. Initial encounter.  EXAM: CT ABDOMEN AND PELVIS WITH CONTRAST  TECHNIQUE: Multidetector CT imaging of the abdomen and pelvis was performed using the standard protocol following bolus administration of intravenous contrast.  CONTRAST:  100mL OMNIPAQUE IOHEXOL 300 MG/ML  SOLN  COMPARISON:  Post lap band upper GI 10/08/2011.  FINDINGS: Negative lung bases. Mild cardiomegaly. No pericardial or pleural effusion.  Degenerative changes in the lumbar spine. No acute osseous abnormality identified.  No pelvic free fluid. Uterus and adnexa within normal limits. Decompressed distal colon.  Occasional diverticula in the sigmoid colon and descending colon, no active inflammation. Negative transverse colon right colon and appendix. Oral contrast has not yet reached the distal small bowel. Negative terminal ileum. No dilated small bowel.  Sequelae of gastric banding. No adverse features identified. Contrast in the stomach and duodenum.  Surgically absent gallbladder.  Mild to moderate intra and extrahepatic biliary ductal enlargement likely is postoperative. Liver enhancement within normal limits. Spleen, pancreas and adrenal glands within normal limits. Portal venous system and major arterial structures within normal limits. No abdominal free fluid.  A symmetric renal enhancement and contrast excretion, with delayed nephrogram on the left. There is a triangular 4 x 7 mm calculus in the proximal left ureter about 2 cm distal to the left UPJ. Left hydronephrosis and proximal left hydroureter with periureteral stranding. Distal to the calculus the left ureter is unremarkable to the bladder. Negative right ureter. Unremarkable bladder. No lymphadenopathy.  IMPRESSION: 1. Acute obstructive uropathy on the left with a triangular 4 x 7 mm proximal left ureteral calculus 2 cm distal to the left UPJ. 2. No other acute findings in the abdomen or pelvis.   Electronically Signed   By: H  Hall M.D.   On: 05/16/2014 11:57    Impression/Recommendation: Left nephrolithiasis with proximal hydro--pt has persistent pain with N/V unrelieved by IV medications as well as an elevated WBC with a UTI, therefore, will schedule for left ureteral stent today by Dr. Herrick.  Pt received Rocephin in the ED.  Keep NPO and obtain consent.  UTI--send urine for culture.    She will be discharged home later with PO ABx, flomax, pain medication, anti-emetic, and anticholinergic.     Karanvir Balderston 05/16/2014, 3:29 PM   

## 2014-05-31 ENCOUNTER — Encounter (HOSPITAL_COMMUNITY)
Admission: RE | Disposition: A | Discharge status: Home / Self Care | Payer: Self-pay | Source: Ambulatory Visit | Attending: Urology

## 2014-05-31 ENCOUNTER — Ambulatory Visit (HOSPITAL_COMMUNITY): Payer: 59 | Admitting: Anesthesiology

## 2014-05-31 ENCOUNTER — Ambulatory Visit (HOSPITAL_COMMUNITY)
Admission: RE | Admit: 2014-05-31 | Discharge: 2014-05-31 | Disposition: A | Discharge status: Home / Self Care | Payer: 59 | Source: Ambulatory Visit | Attending: Urology | Admitting: Urology

## 2014-05-31 ENCOUNTER — Encounter (HOSPITAL_COMMUNITY): Payer: Self-pay | Admitting: *Deleted

## 2014-05-31 DIAGNOSIS — Z87440 Personal history of urinary (tract) infections: Secondary | ICD-10-CM | POA: Insufficient documentation

## 2014-05-31 DIAGNOSIS — Z7982 Long term (current) use of aspirin: Secondary | ICD-10-CM | POA: Insufficient documentation

## 2014-05-31 DIAGNOSIS — Z79899 Other long term (current) drug therapy: Secondary | ICD-10-CM | POA: Diagnosis not present

## 2014-05-31 DIAGNOSIS — Z9884 Bariatric surgery status: Secondary | ICD-10-CM | POA: Diagnosis not present

## 2014-05-31 DIAGNOSIS — N201 Calculus of ureter: Secondary | ICD-10-CM | POA: Diagnosis not present

## 2014-05-31 DIAGNOSIS — N200 Calculus of kidney: Secondary | ICD-10-CM

## 2014-05-31 DIAGNOSIS — I739 Peripheral vascular disease, unspecified: Secondary | ICD-10-CM | POA: Diagnosis not present

## 2014-05-31 HISTORY — PX: CYSTOSCOPY WITH URETEROSCOPY AND STENT PLACEMENT: SHX6377

## 2014-05-31 SURGERY — CYSTOURETEROSCOPY, WITH STENT INSERTION
Anesthesia: General | Laterality: Left

## 2014-05-31 MED ORDER — ACETAMINOPHEN 160 MG/5ML PO SOLN
325.0000 mg | ORAL | Status: DC | PRN
  Filled 2014-05-31: qty 20.3

## 2014-05-31 MED ORDER — CIPROFLOXACIN HCL 500 MG PO TABS
500.0000 mg | ORAL_TABLET | Freq: Once | ORAL | Status: DC
Start: 2014-05-31 — End: 2023-03-18

## 2014-05-31 MED ORDER — PROPOFOL 10 MG/ML IV BOLUS
INTRAVENOUS | Status: AC
  Filled 2014-05-31: qty 20

## 2014-05-31 MED ORDER — HYDROMORPHONE HCL 1 MG/ML IJ SOLN
INTRAMUSCULAR | Status: AC
  Filled 2014-05-31: qty 1

## 2014-05-31 MED ORDER — ONDANSETRON HCL 4 MG/2ML IJ SOLN
INTRAMUSCULAR | Status: DC | PRN
Start: 2014-05-31 — End: 2014-05-31
  Administered 2014-05-31: 4 mg via INTRAVENOUS

## 2014-05-31 MED ORDER — MEPERIDINE HCL 50 MG/ML IJ SOLN: 6.2500 mg | INTRAMUSCULAR | Status: DC | PRN

## 2014-05-31 MED ORDER — DEXAMETHASONE SODIUM PHOSPHATE 10 MG/ML IJ SOLN
INTRAMUSCULAR | Status: DC | PRN
  Administered 2014-05-31: 10 mg via INTRAVENOUS

## 2014-05-31 MED ORDER — KETOROLAC TROMETHAMINE 30 MG/ML IJ SOLN: 30.0000 mg | Freq: Once | INTRAMUSCULAR | Status: DC | PRN

## 2014-05-31 MED ORDER — FENTANYL CITRATE 0.05 MG/ML IJ SOLN
INTRAMUSCULAR | Status: AC
  Filled 2014-05-31: qty 5

## 2014-05-31 MED ORDER — MIDAZOLAM HCL 5 MG/5ML IJ SOLN
INTRAMUSCULAR | Status: DC | PRN
  Administered 2014-05-31 (×2): 1 mg via INTRAVENOUS

## 2014-05-31 MED ORDER — PROPOFOL 10 MG/ML IV BOLUS: INTRAVENOUS | Status: DC | PRN

## 2014-05-31 MED ORDER — LIDOCAINE HCL 2 % EX GEL
CUTANEOUS | Status: AC
  Filled 2014-05-31: qty 10

## 2014-05-31 MED ORDER — ONDANSETRON HCL 4 MG/2ML IJ SOLN
INTRAMUSCULAR | Status: AC
  Filled 2014-05-31: qty 2

## 2014-05-31 MED ORDER — ACETAMINOPHEN 10 MG/ML IV SOLN
1000.0000 mg | Freq: Once | INTRAVENOUS | Status: AC
  Administered 2014-05-31: 1000 mg via INTRAVENOUS
  Filled 2014-05-31: qty 100

## 2014-05-31 MED ORDER — LACTATED RINGERS IV SOLN: INTRAVENOUS | Status: DC

## 2014-05-31 MED ORDER — PROPOFOL 10 MG/ML IV BOLUS
INTRAVENOUS | Status: DC | PRN
  Administered 2014-05-31: 150 mg via INTRAVENOUS

## 2014-05-31 MED ORDER — TAMSULOSIN HCL 0.4 MG PO CAPS: 0.4000 mg | ORAL_CAPSULE | Freq: Every day | ORAL | Status: DC

## 2014-05-31 MED ORDER — SCOPOLAMINE 1 MG/3DAYS TD PT72
MEDICATED_PATCH | TRANSDERMAL | Status: DC | PRN
  Administered 2014-05-31: 1 via TRANSDERMAL

## 2014-05-31 MED ORDER — DEXAMETHASONE SODIUM PHOSPHATE 10 MG/ML IJ SOLN
INTRAMUSCULAR | Status: AC
  Filled 2014-05-31: qty 1

## 2014-05-31 MED ORDER — CIPROFLOXACIN IN D5W 400 MG/200ML IV SOLN
400.0000 mg | INTRAVENOUS | Status: AC
  Administered 2014-05-31: 400 mg via INTRAVENOUS

## 2014-05-31 MED ORDER — ACETAMINOPHEN 325 MG PO TABS: 325.0000 mg | ORAL_TABLET | ORAL | Status: DC | PRN

## 2014-05-31 MED ORDER — CIPROFLOXACIN IN D5W 400 MG/200ML IV SOLN
INTRAVENOUS | Status: AC
  Filled 2014-05-31: qty 200

## 2014-05-31 MED ORDER — IOHEXOL 300 MG/ML  SOLN
INTRAMUSCULAR | Status: DC | PRN
  Administered 2014-05-31: 18 mL via URETHRAL

## 2014-05-31 MED ORDER — SODIUM CHLORIDE 0.9 % IR SOLN
Status: DC | PRN
  Administered 2014-05-31: 6000 mL

## 2014-05-31 MED ORDER — HYDROMORPHONE HCL 1 MG/ML IJ SOLN
0.2500 mg | INTRAMUSCULAR | Status: DC | PRN
  Administered 2014-05-31: 0.5 mg via INTRAVENOUS

## 2014-05-31 MED ORDER — TROSPIUM CHLORIDE ER 60 MG PO CP24
60.0000 mg | ORAL_CAPSULE | Freq: Every day | ORAL | Status: DC
Start: 2014-05-31 — End: 2023-03-18

## 2014-05-31 MED ORDER — ACETAMINOPHEN-CODEINE #3 300-30 MG PO TABS: 1.0000 | ORAL_TABLET | ORAL | Status: DC | PRN

## 2014-05-31 MED ORDER — LIDOCAINE HCL 2 % EX GEL
CUTANEOUS | Status: DC | PRN
  Administered 2014-05-31: 1 via URETHRAL

## 2014-05-31 MED ORDER — LACTATED RINGERS IV SOLN
INTRAVENOUS | Status: DC | PRN
  Administered 2014-05-31: 07:00:00 via INTRAVENOUS

## 2014-05-31 MED ORDER — METOCLOPRAMIDE HCL 5 MG/ML IJ SOLN: 10.0000 mg | Freq: Once | INTRAMUSCULAR | Status: DC | PRN

## 2014-05-31 MED ORDER — BELLADONNA ALKALOIDS-OPIUM 16.2-60 MG RE SUPP
RECTAL | Status: DC | PRN
  Administered 2014-05-31: 1 via RECTAL

## 2014-05-31 MED ORDER — FENTANYL CITRATE 0.05 MG/ML IJ SOLN
INTRAMUSCULAR | Status: DC | PRN
  Administered 2014-05-31 (×5): 50 ug via INTRAVENOUS

## 2014-05-31 MED ORDER — SCOPOLAMINE 1 MG/3DAYS TD PT72
MEDICATED_PATCH | TRANSDERMAL | Status: AC
  Filled 2014-05-31: qty 1

## 2014-05-31 MED ORDER — MIDAZOLAM HCL 2 MG/2ML IJ SOLN
INTRAMUSCULAR | Status: AC
  Filled 2014-05-31: qty 2

## 2014-05-31 SURGICAL SUPPLY — 16 items
BAG URO CATCHER STRL LF (DRAPE) ×3 IMPLANT
BASKET LASER NITINOL 1.9FR (BASKET) IMPLANT
BASKET ZERO TIP NITINOL 2.4FR (BASKET) IMPLANT
CATH URET 5FR 28IN OPEN ENDED (CATHETERS) ×3 IMPLANT
CLOTH BEACON ORANGE TIMEOUT ST (SAFETY) ×3 IMPLANT
EXTRACTOR STONE NITINOL NGAGE (UROLOGICAL SUPPLIES) IMPLANT
GLOVE BIOGEL M STRL SZ7.5 (GLOVE) ×9 IMPLANT
GOWN STRL REUS W/TWL XL LVL3 (GOWN DISPOSABLE) ×6 IMPLANT
GUIDEWIRE ANG ZIPWIRE 038X150 (WIRE) IMPLANT
GUIDEWIRE STR DUAL SENSOR (WIRE) ×6 IMPLANT
PACK CYSTO (CUSTOM PROCEDURE TRAY) ×3 IMPLANT
SHEATH ACCESS URETERAL 38CM (SHEATH) ×3 IMPLANT
STENT CONTOUR 6FRX26X.038 (STENTS) ×3 IMPLANT
TUBE FEEDING 8FR 16IN STR KANG (MISCELLANEOUS) IMPLANT
TUBING CONNECTING 10 (TUBING) ×2 IMPLANT
TUBING CONNECTING 10' (TUBING) ×1

## 2014-05-31 NOTE — Anesthesia Preprocedure Evaluation (Signed)
Anesthesia Evaluation  Patient identified by MRN, date of birth, ID band Patient awake    Reviewed: Allergy & Precautions, NPO status , Patient's Chart, lab work & pertinent test results  History of Anesthesia Complications (+) PONV and history of anesthetic complications  Airway Mallampati: I  TM Distance: >3 FB Neck ROM: Full    Dental no notable dental hx. (+) Teeth Intact   Pulmonary neg pulmonary ROS,  breath sounds clear to auscultation  Pulmonary exam normal       Cardiovascular + Peripheral Vascular Disease + Valvular Problems/Murmurs Rhythm:Regular Rate:Normal     Neuro/Psych Anxiety negative neurological ROS     GI/Hepatic hiatal hernia, S/P Lap gastric banding   Endo/Other  Obesity  Renal/GU Renal diseaseRenal calculus  negative genitourinary   Musculoskeletal negative musculoskeletal ROS (+)   Abdominal (+) + obese,   Peds  Hematology negative hematology ROS (+)   Anesthesia Other Findings   Reproductive/Obstetrics negative OB ROS                             Anesthesia Physical Anesthesia Plan  ASA: II  Anesthesia Plan: General   Post-op Pain Management:    Induction: Intravenous  Airway Management Planned: LMA and Oral ETT  Additional Equipment:   Intra-op Plan:   Post-operative Plan: Extubation in OR  Informed Consent: I have reviewed the patients History and Physical, chart, labs and discussed the procedure including the risks, benefits and alternatives for the proposed anesthesia with the patient or authorized representative who has indicated his/her understanding and acceptance.   Dental advisory given  Plan Discussed with: CRNA, Anesthesiologist and Surgeon  Anesthesia Plan Comments:         Anesthesia Quick Evaluation

## 2014-05-31 NOTE — Anesthesia Postprocedure Evaluation (Signed)
  Anesthesia Post-op Note  Patient: Darlene Patterson  Procedure(s) Performed: Procedure(s): LEFT URETEROSCOPY AND STENT EXCHANGE, LEFT RETROGRADE (Left)  Patient Location: PACU  Anesthesia Type:General  Level of Consciousness: awake, alert  and oriented  Airway and Oxygen Therapy: Patient Spontanous Breathing  Post-op Pain: mild  Post-op Assessment: Post-op Vital signs reviewed, Patient's Cardiovascular Status Stable, Respiratory Function Stable, Patent Airway, No signs of Nausea or vomiting and Pain level controlled  Post-op Vital Signs: Reviewed and stable  Last Vitals:  Filed Vitals:   05/31/14 0915  BP: 136/88  Pulse: 71  Temp: 36.6 C  Resp: 11    Complications: No apparent anesthesia complications

## 2014-05-31 NOTE — Anesthesia Procedure Notes (Signed)
Procedure Name: LMA Insertion Date/Time: 05/31/2014 7:15 AM Performed by: Delphia GratesHANDLER, Vineeth Fell Pre-anesthesia Checklist: Patient identified, Emergency Drugs available, Suction available and Patient being monitored Patient Re-evaluated:Patient Re-evaluated prior to inductionOxygen Delivery Method: Circle system utilized Preoxygenation: Pre-oxygenation with 100% oxygen Intubation Type: IV induction LMA: LMA with gastric port inserted Tube size: 4.0 mm Number of attempts: 1 Placement Confirmation: positive ETCO2 and breath sounds checked- equal and bilateral Tube secured with: Tape Dental Injury: Teeth and Oropharynx as per pre-operative assessment

## 2014-05-31 NOTE — Op Note (Signed)
Preoperative diagnosis: left ureteral calculus  Postoperative diagnosis: left ureteral calculus  Procedure:  1. Cystoscopy 2. left ureteroscopy and stone removal 3. left 61F x 26cm  ureteral stent exchange 4. left retrograde pyelography with interpretation  Surgeon: Crist Fat, MD  Anesthesia: General  Complications: None  Intraoperative findings: left retrograde pyelography demonstrated a filling defect within the left renal pelvis, lower pole.  The stone evidently was pushed into the kidney at the time of initial stent placement. There were no other abnormalities.  EBL: Minimal  Specimens: 1. left ureteral calculus  Disposition of specimens: [facility] for stone analysis  Indication: Darlene Patterson is a 44 y.o.   patient with urolithiasis. She presented 2 weeks ago with an obstructing stone and evidence of a UTI. She underwent urgent ureteral stent placement at that time. She presents today for more definitive management. After reviewing the management options for treatment, the patient elected to proceed with the above surgical procedure(s). We have discussed the potential benefits and risks of the procedure, side effects of the proposed treatment, the likelihood of the patient achieving the goals of the procedure, and any potential problems that might occur during the procedure or recuperation. Informed consent has been obtained.  Description of procedure:  The patient was taken to the operating room and general anesthesia was induced.  The patient was placed in the dorsal lithotomy position, prepped and draped in the usual sterile fashion, and preoperative antibiotics were administered. A preoperative time-out was performed.   Cystourethroscopy was performed.  The patient's urethra was examined and was normal.  Once in the bladder I grasped the stent emanating from the patient's left ureteral orifice and pulled out to the urethral meatus. I then  advanced a 0.38 sensor wire through the stent into the left renal pelvis. The stent was then removed over the wire. I then exchanged the stent for a 5 [place] open-ended ureteral catheter and performed retrograde pyelogram with the above findings. I then replaced the wire and removed the open-ended ureteral catheter.  The 6 Fr semirigid ureteroscope was then advanced into the ureter next to the guidewire and no calculus was identified. I then advanced a second 0.38 sensor wire through the ureteroscope and into the left renal pelvis removing the scope over the wire. Using fluoroscopic guidance I advanced a 12/14 French ureteral access sheath into the proximal ureter. The inner part of the sheath was removed.  I then advanced a flexible ureteroscope through the access sheath and into the left renal pelvis. Pyeloscopy was then performed and each helix explored. The stone was identified in the lower pole posterior calyx. This was grabbed with a 0 tip nitinol basket and removed through the access sheath without having to use the laser for fragmentation.  I then confirmed that there were no additional stones by performing pyeloscopy again with fluoroscopic guidance. I then slowly backed out the flexible ureteroscope through the ureter pulling back the access sheath as I went inspecting the ureter in its entirety. It was noted to be intact without significant trauma.  The stent was then backloaded over the safety wire and using fluoroscopy advanced up to the left renal pelvis. The stent was then advanced to the urethral meatus and pressure placed upon the pubic bone and though wire so as to remove the wire leaving the stent behind. Once the wire was removed distal end of the stent curled into the patient's bladder. The bladder was then drained. A nice curl was confirmed  in the renal pelvis and bladder using fluoroscopy.  The bladder was then emptied and the procedure ended.  The patient appeared to tolerate the  procedure well and without complications.  The patient was able to be awakened and transferred to the recovery unit in satisfactory condition.   Disposition: The tether of the stent was left on and tucked inside the patient's vagina.  Instructions for removing the stent have been provided to the patient. This has been scheduled for followup in 6 weeks with a renal ultrasound.

## 2014-05-31 NOTE — Transfer of Care (Signed)
Immediate Anesthesia Transfer of Care Note  Patient: Darlene Patterson  Procedure(s) Performed: Procedure(s): LEFT URETEROSCOPY AND STENT EXCHANGE, LEFT RETROGRADE (Left)  Patient Location: PACU  Anesthesia Type:General  Level of Consciousness: awake, alert , oriented and patient cooperative  Airway & Oxygen Therapy: Patient Spontanous Breathing and Patient connected to face mask oxygen  Post-op Assessment: Report given to RN and Post -op Vital signs reviewed and stable  Post vital signs: Reviewed and stable  Last Vitals:  Filed Vitals:   05/31/14 0545  BP: 117/80  Pulse: 95  Temp: 36.4 C  Resp: 18    Complications: No apparent anesthesia complications

## 2014-05-31 NOTE — Discharge Instructions (Signed)
DISCHARGE INSTRUCTIONS FOR KIDNEY STONE/URETERAL STENT   MEDICATIONS:  1.  Resume all your other meds from home - except do not take any extra narcotic pain meds that you may have at home.  2. Trospium is to prevent bladder spasms and help reduce urinary frequency. 3. Pyridium is to help with the burning/stinging when you urinate. 4. Tylenol with codeine is for moderate/severe pain, otherwise taking upto 1000mg  every 6 hours of plainTylenol will help treat your pain.  Do not take both at the same time. 5. Take Cipro one hour prior to removal of your stent.   ACTIVITY:  1. No strenuous activity x 1week  2. No driving while on narcotic pain medications  3. Drink plenty of water  4. Continue to walk at home - you can still get blood clots when you are at home, so keep active, but don't over do it.  5. May return to work/school tomorrow or when you feel ready   BATHING:  1. You can shower and we recommend daily showers  2. You have a string coming from your urethra: The stent string is attached to your ureteral stent. Do not pull on this.   SIGNS/SYMPTOMS TO CALL:  Please call us if you have a fever greater than 101.5, uncontrolled nausea/vomiting, uncontrolled pain, dizziness, unable to urinate, bloody urine, chest pain, shortness of breath, leg swelling, leg pain, redness around wound, drainage from wound, or any other concerns or questions.   You can reach us at [phone].   FOLLOW-UP:  1. You have an appointment in 6 weeks with a ultrasound of your kidneys prior.  2. You have a string attached to your stent, you may remove it on March 28th (Monday). To do this, pull the strings until the stents are completely removed. You may feel an odd sensation in your back.

## 2014-06-01 ENCOUNTER — Encounter (HOSPITAL_COMMUNITY): Payer: Self-pay | Admitting: Urology

## 2014-12-29 ENCOUNTER — Encounter: Payer: Self-pay | Admitting: Emergency Medicine

## 2014-12-29 ENCOUNTER — Emergency Department (INDEPENDENT_AMBULATORY_CARE_PROVIDER_SITE_OTHER)
Admission: EM | Admit: 2014-12-29 | Discharge: 2014-12-29 | Disposition: A | Discharge status: Home / Self Care | Payer: 59 | Source: Home / Self Care | Attending: Family Medicine | Admitting: Family Medicine

## 2014-12-29 DIAGNOSIS — H109 Unspecified conjunctivitis: Secondary | ICD-10-CM

## 2014-12-29 DIAGNOSIS — H0266 Xanthelasma of left eye, unspecified eyelid: Secondary | ICD-10-CM

## 2014-12-29 DIAGNOSIS — H1131 Conjunctival hemorrhage, right eye: Secondary | ICD-10-CM

## 2014-12-29 MED ORDER — POLYMYXIN B-TRIMETHOPRIM 10000-0.1 UNIT/ML-% OP SOLN: 1.0000 [drp] | Freq: Four times a day (QID) | OPHTHALMIC | Status: DC

## 2014-12-29 NOTE — ED Notes (Signed)
Pt c/o noticing a busted blood vessel? In right eye last night. States her left eye has been swollen and drainage for about 2 weeks.

## 2014-12-29 NOTE — Discharge Instructions (Signed)
Subconjunctival Hemorrhage Subconjunctival hemorrhage is bleeding that happens between the white part of your eye (sclera) and the clear membrane that covers the outside of your eye (conjunctiva). There are many tiny blood vessels near the surface of your eye. A subconjunctival hemorrhage happens when one or more of these vessels breaks and bleeds, causing a red patch to appear on your eye. This is similar to a bruise. Depending on the amount of bleeding, the red patch may only cover a small area of your eye or it may cover the entire visible part of the sclera. If a lot of blood collects under the conjunctiva, there may also be swelling. Subconjunctival hemorrhages do not affect your vision or cause pain, but your eye may feel irritated if there is swelling. Subconjunctival hemorrhages usually do not require treatment, and they disappear on their own within two weeks. CAUSES This condition may be caused by:  Mild trauma, such as rubbing your eye too hard.  Severe trauma or blunt injuries.  Coughing, sneezing, or vomiting.  Straining, such as when lifting a heavy object.  High blood pressure.  Recent eye surgery.  A history of diabetes.  Certain medicines, especially blood thinners (anticoagulants).  Other conditions, such as eye tumors, bleeding disorders, or blood vessel abnormalities. Subconjunctival hemorrhages can happen without an obvious cause.  SYMPTOMS  Symptoms of this condition include:  A bright red or dark red patch on the white part of the eye.  The red area may spread out to cover a larger area of the eye before it goes away.  The red area may turn brownish-yellow before it goes away.  Swelling.  Mild eye irritation. DIAGNOSIS This condition is diagnosed with a physical exam. If your subconjunctival hemorrhage was caused by trauma, your health care provider may refer you to an eye specialist (ophthalmologist) or another specialist to check for other injuries. You  may have other tests, including:  An eye exam.  A blood pressure check.  Blood tests to check for bleeding disorders. If your subconjunctival hemorrhage was caused by trauma, X-rays or a CT scan may be done to check for other injuries. TREATMENT Usually, no treatment is needed. Your health care provider may recommend eye drops or cold compresses to help with discomfort. HOME CARE INSTRUCTIONS  Take over-the-counter and prescription medicines only as directed by your health care provider.  Use eye drops or cold compresses to help with discomfort as directed by your health care provider.  Avoid activities, things, and environments that may irritate or injure your eye.  Keep all follow-up visits as told by your health care provider. This is important. SEEK MEDICAL CARE IF:  You have pain in your eye.  The bleeding does not go away within 3 weeks.  You keep getting new subconjunctival hemorrhages. SEEK IMMEDIATE MEDICAL CARE IF:  Your vision changes or you have difficulty seeing.  You suddenly develop severe sensitivity to light.  You develop a severe headache, persistent vomiting, confusion, or abnormal tiredness (lethargy).  Your eye seems to bulge or protrude from your eye socket.  You develop unexplained bruises on your body.  You have unexplained bleeding in another area of your body.   This information is not intended to replace advice given to you by your health care provider. Make sure you discuss any questions you have with your health care provider.   Document Released: 02/24/2005 Document Revised: 11/15/2014 Document Reviewed: 05/03/2014 Elsevier Interactive Patient Education 2016 [employer]  Bacterial Conjunctivitis Bacterial conjunctivitis (commonly  called pink eye) is redness, soreness, or puffiness (inflammation) of the white part of your eye. It is caused by a germ called bacteria. These germs can easily spread from person to person (contagious). Your eye  often will become red or pink. Your eye may also become irritated, watery, or have a thick discharge.  HOME CARE   Apply a cool, clean washcloth over closed eyelids. Do this for 10-20 minutes, 3-4 times a day while you have pain.  Gently wipe away any fluid coming from the eye with a warm, wet washcloth or cotton ball.  Wash your hands often with soap and water. Use paper towels to dry your hands.  Do not share towels or washcloths.  Change or wash your pillowcase every day.  Do not use eye makeup until the infection is gone.  Do not use machines or drive if your vision is blurry.  Stop using contact lenses. Do not use them again until your doctor says it is okay.  Do not touch the tip of the eye drop bottle or medicine tube with your fingers when you put medicine on the eye. GET HELP RIGHT AWAY IF:   Your eye is not better after 3 days of starting your medicine.  You have a yellowish fluid coming out of the eye.  You have more pain in the eye.  Your eye redness is spreading.  Your vision becomes blurry.  You have a fever or lasting symptoms for more than 2-3 days.  You have a fever and your symptoms suddenly get worse.  You have pain in the face.  Your face gets red or puffy (swollen). MAKE SURE YOU:   Understand these instructions.  Will watch this condition.  Will get help right away if you are not doing well or get worse.   This information is not intended to replace advice given to you by your health care provider. Make sure you discuss any questions you have with your health care provider.   Document Released: 12/04/2007 Document Revised: 02/11/2012 Document Reviewed: 10/31/2011 Elsevier Interactive Patient Education [employer]

## 2014-12-29 NOTE — ED Provider Notes (Signed)
CSN: [id number]     Arrival date & time 12/29/14  1014 History   First MD Initiated Contact with Patient 12/29/14 1047     Chief Complaint  Patient presents with  . Eye Problem   (Consider location/radiation/quality/duration/timing/severity/associated sxs/prior Treatment) HPI Pt is a 44yo female presenting to [facility] with c/o busted blood vessel in Right eye that she noticed this morning but also notes she developed left eye erythema, edema, and yellow discharge for 2 weeks. Denies any pain in her eyes.  She does have mild itching in her Left eye with small amount of crusting discharge in the nasal canthus of her eye.  She has not tried anything for symptoms. Pt states she does have nasal congestion and has been sneezing due to her allergies but denies any eye injuries. Denies sick contacts. Denies fever, cough, or sore throat. She does wear reading glasses but not contacts.  Past Medical History  Diagnosis Date  . Obesity   . Complication of anesthesia   . PONV (postoperative nausea and vomiting)     'SEVERE PER PT"  . Heart murmur     early teens- resolved  . Anxiety     with anesthesia  . H/O hiatal hernia     per upper GI 3/13  . Peripheral vascular disease (HCC)     varicose veins- left  . Great toe pain     cyst left great toe- to be excised 05-24-14- [place]  . Status post gastric banding   . Kidney stone     presently has stent-surgery planned   Past Surgical History  Procedure Laterality Date  . Cholecystectomy  2011  . Achilles tendon repair  08, 09    bilateral  . Cesarean section  87, 89  . Breath tek h pylori  05/21/2011    Procedure: BREATH TEK H PYLORI;  Surgeon: Valarie MerinoMatthew B Martin, MD;  [facility];  Service: General;  Laterality: N/A;  . Abdominal hysterectomy  3/13  . 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 Vascular surgery      left leg- s/p  injections of veins  . Laparoscopic gastric banding  10/07/2011    Procedure: LAPAROSCOPIC GASTRIC  BANDING;  Surgeon: Valarie MerinoMatthew B Martin, MD;  Location: [facility];  Service: General;  Laterality: N/A;  . Hiatal hernia repair  10/07/2011    Procedure: LAPAROSCOPIC REPAIR OF HIATAL HERNIA;  Surgeon: Valarie MerinoMatthew B Martin, MD;  Location: [facility];  Service: General;  Laterality: N/A;  . Cystoscopy w/ ureteral stent placement Left 05/16/2014    Procedure: CYSTOSCOPY WITH RETROGRADE PYELOGRAM/URETERAL STENT PLACEMENT;  Surgeon: Crist FatBenjamin W Herrick, MD;  Location: [facility];  Service: Urology;  Laterality: Left;  . Cystoscopy with ureteroscopy and stent placement Left 05/31/2014    Procedure: LEFT URETEROSCOPY AND STENT EXCHANGE, LEFT RETROGRADE;  Surgeon: Crist FatBenjamin W Herrick, MD;  Location: [facility];  Service: Urology;  Laterality: Left;   Family History  Problem Relation Age of Onset  . Hypertension Sister   . COPD Mother    Social History  Substance Use Topics  . Smoking status: Never Smoker   . Smokeless tobacco: Never Used  . Alcohol Use: Yes     Comment: 2-3 beers on 2 weekends/month   OB History    No data available     Review of Systems  Constitutional: Negative for fever and chills.  HENT: Positive for congestion, rhinorrhea and sneezing. Negative for ear pain and sore throat.  Eyes: Positive for discharge, redness and itching. Negative for photophobia, pain and visual disturbance.  Gastrointestinal: Negative for nausea, vomiting, abdominal pain and diarrhea.  Musculoskeletal: Negative for myalgias and arthralgias.  Skin: Negative for color change and rash.    Allergies  Carbon dioxide  Home Medications   Prior to Admission medications   Medication Sig Start Date End Date Taking? Authorizing Provider  acetaminophen-codeine (TYLENOL #3) 300-30 MG per tablet Take 1 tablet by mouth every 4 (four) hours as needed. 05/31/14   Crist Fat, MD  aspirin 81 MG tablet Take 81 mg by mouth every morning.     Historical Provider, MD  cetirizine (ZYRTEC) 10 MG tablet Take 10 mg by mouth daily.      Historical Provider, MD  ciprofloxacin (CIPRO) 500 MG tablet Take 1 tablet (500 mg total) by mouth once. 05/31/14   Crist Fat, MD  Cyanocobalamin (VITAMIN B-12) 500 MCG SUBL Place 1 tablet under the tongue every morning.    Historical Provider, MD  FLAXSEED, LINSEED, PO Take 1 tablet by mouth daily.     Historical Provider, MD  magnesium oxide (MAG-OX) 400 MG tablet Take 400 mg by mouth daily.    Historical Provider, MD  multivitamin-iron-minerals-folic acid (CENTRUM) chewable tablet Chew 1 tablet by mouth every morning.     Historical Provider, MD  phenazopyridine (PYRIDIUM) 200 MG tablet Take 1 tablet (200 mg total) by mouth 3 (three) times daily as needed for pain. Patient not taking: Reported on 05/23/2014 05/16/14   Crist Fat, MD  tamsulosin [facility]) 0.4 MG CAPS capsule Take 1 capsule (0.4 mg total) by mouth daily. 05/31/14   Crist Fat, MD  trimethoprim-polymyxin b Joaquim Lai) ophthalmic solution Place 1 drop into the left eye every 6 (six) hours. For 5 days 12/29/14   Junius Finner, PA-C  Trospium Chloride 60 MG CP24 Take 1 capsule (60 mg total) by mouth daily. 05/31/14   Crist Fat, MD   Meds Ordered and Administered this Visit  Medications - No data to display  BP 117/69 mmHg  Pulse 74  Temp(Src) 98.4 F (36.9 C) (Oral)  SpO2 98%  LMP 06/01/2011 No data found.   Physical Exam  Constitutional: She is oriented to person, place, and time. She appears well-developed and well-nourished.  HENT:  Head: Normocephalic and atraumatic.  Eyes: EOM are normal. Pupils are equal, round, and reactive to light. Lids are everted and swept, no foreign bodies found. Right eye exhibits no discharge. No foreign body present in the right eye. Left eye exhibits discharge. No foreign body present in the left eye. Right conjunctiva has a hemorrhage.  Right eye: subconjunctival hemorrhage in superior aspect of eye. No cutaneous erythema, edema or tenderness.  No bleeding or  discharge  Left eye: small xanthelasma in upper eyelid near nasal canthus.  Mild erythema with scant yellow discharge from nasal canthus.  No periorbital edema or tenderness.  No fluorescein uptake in either eye  Neck: Normal range of motion.  Cardiovascular: Normal rate.   Pulmonary/Chest: Effort normal.  Musculoskeletal: Normal range of motion.  Neurological: She is alert and oriented to person, place, and time.  Skin: Skin is warm and dry.  Psychiatric: She has a normal mood and affect. Her behavior is normal.  Nursing note and vitals reviewed.   ED Course  Procedures (including critical care time)  Labs Review Labs Reviewed - No data to display  Imaging Review No results found.   Visual Acuity Review  Right  Eye Distance: 20/20 Left Eye Distance: 20/25 Bilateral Distance:     MDM   1. Subconjunctival hemorrhage of right eye   2. Conjunctivitis, left eye   3. Xanthelasma of eyelid, left    Pt c/o Left eye erythema, discharge and irritation for 2 weeks. Mild erythema and discharge on exam. No evidence of periorbital cellulitis. Small xanthelasma noted. Due to duration of symptoms will place pt on Rx: Polytrim ophthalmic drops in Left eye. Also discussed use of warm compresses. Right eye- subconjunctival hematoma with evidence of hyphema or periorbital cellulitis.  Hematoma likely due to pt sneezing due to allergies.  Discussed signs/symptoms not concerning for further evaluation. Redness will improve with time.   F/u with PCP in 4-5 days if not improving or ophthalmologist. Also encouraged pt to f/u with PCP for cholesterol check as pt has xanthelasma which could indicate underlying hyperlipidemia. Patient verbalized understanding and agreement with treatment plan.    Junius Finner, PA-C 12/29/14 1238

## 2015-01-22 ENCOUNTER — Encounter: Payer: Self-pay | Admitting: Emergency Medicine

## 2015-01-22 ENCOUNTER — Emergency Department (INDEPENDENT_AMBULATORY_CARE_PROVIDER_SITE_OTHER)
Admission: EM | Admit: 2015-01-22 | Discharge: 2015-01-22 | Disposition: A | Discharge status: Home / Self Care | Payer: 59 | Source: Home / Self Care | Attending: Family Medicine | Admitting: Family Medicine

## 2015-01-22 DIAGNOSIS — H01006 Unspecified blepharitis left eye, unspecified eyelid: Secondary | ICD-10-CM

## 2015-01-22 MED ORDER — GENTAMICIN SULFATE 0.3 % OP SOLN: 2.0000 [drp] | OPHTHALMIC | Status: DC

## 2015-01-22 NOTE — Discharge Instructions (Signed)
Blepharitis Blepharitis is inflammation of the eyelids. Blepharitis may happen with:  Reddish, scaly skin around the scalp and eyebrows.  Burning or itching of the eyelids.  Eye discharge at night that causes the eyelashes to stick together in the morning.  Eyelashes that fall out.  Sensitivity to light. HOME CARE INSTRUCTIONS Pay attention to any changes in how you look or feel. Follow these instructions to help with your condition: Keeping Clean  Wash your hands often.  Wash your eyelids with warm water or with warm water that is mixed with a small amount of baby shampoo. Do this two times per day or as often as needed.  Wash your face and eyebrows at least once a day.  Use a clean towel each time you dry your eyelids. Do not use this towel to clean or dry other areas of your body. Do not share your towel with anyone. General Instructions  Avoid wearing makeup until you get better. Do not share makeup with anyone.  Avoid rubbing your eyes.  Apply warm compresses to your eyes 2 times per day for 10 minutes at a time, or as told by your health care provider.  If you were prescribed an antibiotic ointment or steroid drops, apply or use the medicine as told by your health care provider. Do not stop using the medicine even if you feel better.  Keep all follow-up visits as told by your health care provider. This is important. SEEK MEDICAL CARE IF:  Your eyelids feel hot.  You have blisters or a rash on your eyelids.  The condition does not go away in 2-4 days.  The inflammation gets worse. SEEK IMMEDIATE MEDICAL CARE IF:  You have pain or redness that gets worse or spreads to other parts of your face.  Your vision changes.  You have pain when looking at lights or moving objects.  You have a fever.   This information is not intended to replace advice given to you by your health care provider. Make sure you discuss any questions you have with your health care  provider.   Document Released: 02/22/2000 Document Revised: 11/15/2014 Document Reviewed: 06/19/2014 Elsevier Interactive Patient Education 2016 Elsevier Inc.  

## 2015-01-22 NOTE — ED Provider Notes (Signed)
CSN: [id number]     Arrival date & time 01/22/15  1417 History   First MD Initiated Contact with Patient 01/22/15 1448     Chief Complaint  Patient presents with  . Eye Problem   (Consider location/radiation/quality/duration/timing/severity/associated sxs/prior Treatment) HPI Pt is a 44yo female presenting to [facility] with c/o Left lower eyelid pain, redness, and discharge that started last night.  Pt was seen at [facility] on 12/29/14 for conjunctivitis of Left eye.  She was prescribed Polytrim for 5 days and states symptoms had resolved.  Symptoms at that time were involved medial canthus and upper eyelid.  These symptoms today are on Left lower eyelid.  She notes there was a "bump" on her eyelid last night which popped on its own, resulting in white discharge.  Pt notes she also has a lot of clear tears, watering of her eyes causing blurred vision in her left eye.  She denies trauma to the eye or recent URI symptoms. Denies use of contacts. 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  Pt does not wear glasses or goggles when she works but does not recall a specific instance of getting anything in her eye.    Past Medical History  Diagnosis Date  . Obesity   . Complication of anesthesia   . PONV (postoperative nausea and vomiting)     'SEVERE PER PT"  . Heart murmur     early teens- resolved  . Anxiety     with anesthesia  . H/O hiatal hernia     per upper GI 3/13  . Peripheral vascular disease (HCC)     varicose veins- left  . Great toe pain     cyst left great toe- to be excised 05-24-14- [place]  . Status post gastric banding   . Kidney stone     presently has stent-surgery planned   Past Surgical History  Procedure Laterality Date  . Cholecystectomy  2011  . Achilles tendon repair  08, 09    bilateral  . Cesarean section  89, 93  . Breath tek h pylori  05/21/2011    Procedure: BREATH TEK H PYLORI;   Surgeon: Valarie Merino, MD;  Location: [facility];  Service: General;  Laterality: N/A;  . Abdominal hysterectomy  3/13  . 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 Vascular surgery      left leg- s/p  injections of veins  . Laparoscopic gastric banding  10/07/2011    Procedure: LAPAROSCOPIC GASTRIC BANDING;  Surgeon: Valarie Merino, MD;  Location: [facility];  Service: General;  Laterality: N/A;  . Hiatal hernia repair  10/07/2011    Procedure: LAPAROSCOPIC REPAIR OF HIATAL HERNIA;  Surgeon: Valarie Merino, MD;  Location: [facility];  Service: General;  Laterality: N/A;  . Cystoscopy w/ ureteral stent placement Left 05/16/2014    Procedure: CYSTOSCOPY WITH RETROGRADE PYELOGRAM/URETERAL STENT PLACEMENT;  Surgeon: Crist Fat, MD;  Location: [facility];  Service: Urology;  Laterality: Left;  . Cystoscopy with ureteroscopy and stent placement Left 05/31/2014    Procedure: LEFT URETEROSCOPY AND STENT EXCHANGE, LEFT RETROGRADE;  Surgeon: Crist Fat, MD;  Location: [facility];  Service: Urology;  Laterality: Left;   Family History  Problem Relation Age of Onset  . Hypertension Sister   . COPD Mother    Social History  Substance Use Topics  .  Smoking status: Never Smoker   . Smokeless tobacco: Never Used  . Alcohol Use: Yes     Comment: 2-3 beers on 2 weekends/month   OB History    No data available     Review of Systems  Constitutional: Negative for fever and chills.  HENT: Negative for congestion, ear pain, rhinorrhea, sinus pressure, sneezing and sore throat.   Eyes: Positive for pain, discharge, redness, itching and visual disturbance. Negative for photophobia.  Respiratory: Negative for cough and shortness of breath.   Neurological: Negative for dizziness, light-headedness and headaches.    Allergies  Carbon dioxide  Home Medications   Prior to Admission medications   Medication Sig Start Date End Date Taking? Authorizing Provider  acetaminophen-codeine (TYLENOL #3)  300-30 MG per tablet Take 1 tablet by mouth every 4 (four) hours as needed. 05/31/14   Crist Fat, MD  aspirin 81 MG tablet Take 81 mg by mouth every morning.     Historical Provider, MD  cetirizine (ZYRTEC) 10 MG tablet Take 10 mg by mouth daily.     Historical Provider, MD  ciprofloxacin (CIPRO) 500 MG tablet Take 1 tablet (500 mg total) by mouth once. 05/31/14   Crist Fat, MD  Cyanocobalamin (VITAMIN B-12) 500 MCG SUBL Place 1 tablet under the tongue every morning.    Historical Provider, MD  FLAXSEED, LINSEED, PO Take 1 tablet by mouth daily.     Historical Provider, MD  gentamicin (GARAMYCIN) 0.3 % ophthalmic solution Place 2 drops into the left eye every 4 (four) hours. For 5 days 01/22/15   Junius Finner, PA-C  magnesium oxide (MAG-OX) 400 MG tablet Take 400 mg by mouth daily.    Historical Provider, MD  multivitamin-iron-minerals-folic acid (CENTRUM) chewable tablet Chew 1 tablet by mouth every morning.     Historical Provider, MD  phenazopyridine (PYRIDIUM) 200 MG tablet Take 1 tablet (200 mg total) by mouth 3 (three) times daily as needed for pain. Patient not taking: Reported on 05/23/2014 05/16/14   Crist Fat, MD  tamsulosin [facility]) 0.4 MG CAPS capsule Take 1 capsule (0.4 mg total) by mouth daily. 05/31/14   Crist Fat, MD  trimethoprim-polymyxin b Joaquim Lai) ophthalmic solution Place 1 drop into the left eye every 6 (six) hours. For 5 days 12/29/14   Junius Finner, PA-C  Trospium Chloride 60 MG CP24 Take 1 capsule (60 mg total) by mouth daily. 05/31/14   Crist Fat, MD   Meds Ordered and Administered this Visit  Medications - No data to display  BP 124/87 mmHg  Pulse 82  Temp(Src) 98.1 F (36.7 C) (Oral)  Ht  (1.702 m)  Wt 253 lb (114.76 kg)  BMI 39.62 kg/m2  SpO2 98%  LMP 06/01/2011 No data found.   Physical Exam  Constitutional: She is oriented to person, place, and time. She appears well-developed and well-nourished.  HENT:   Head: Normocephalic and atraumatic.  Eyes: Conjunctivae and EOM are normal. Pupils are equal, round, and reactive to light. Right eye exhibits no discharge. Left eye exhibits discharge and hordeolum. No scleral icterus.  Left lower eyelid: tiny hordeolum with mild erythema and edema along lid margin. Scant white-clear discharge. No bleeding. No periorbital edema or tenderness.  Left eye- no fluorescein uptake.   Neck: Normal range of motion.  Cardiovascular: Normal rate.   Pulmonary/Chest: Effort normal. No respiratory distress.  Musculoskeletal: Normal range of motion.  Neurological: She is alert and oriented to person, place, and time.  Skin:  Skin is warm and dry.  Psychiatric: She has a normal mood and affect. Her behavior is normal.  Nursing note and vitals reviewed.   ED Course  Procedures (including critical care time)  Labs Review Labs Reviewed - No data to display  Imaging Review No results found.    MDM   1. Blepharitis of eyelid of left eye    Pt c/o Left lower eyelid pain, redness, and discharge since yesterday. Pt was tx for conjunctivitis in Left eye on 12/29/14 but states those symptoms resolved completely. Exam today c/w blepharitis. Rx: Gentamicin ophthalmic drops as pt does not want to use an ophthalmic ointment. Discussed using warm compresses 3-4 times a day. Encouraged to f/u with PCP and Ophthalmology or Optometry if symptoms do not improve in 1 week, or if symptoms keep recurring. Encouraged pt to wash her hands before applying compresses or medication. Also encouraged to wear protective eyewear at work to cut back on any dust/debris that may be irritating her eye. Patient verbalized understanding and agreement with treatment plan.    Junius FinnerErin O'Malley, PA-C 01/22/15 [phone]

## 2015-01-22 NOTE — ED Notes (Signed)
Left eye draining, sore, white stye on bottom lid last night

## 2015-11-26 ENCOUNTER — Ambulatory Visit (HOSPITAL_BASED_OUTPATIENT_CLINIC_OR_DEPARTMENT_OTHER)
Admit: 2015-11-26 | Discharge: 2015-11-26 | Disposition: A | Discharge status: Home / Self Care | Payer: 59 | Attending: Emergency Medicine | Admitting: Emergency Medicine

## 2015-11-26 ENCOUNTER — Emergency Department (INDEPENDENT_AMBULATORY_CARE_PROVIDER_SITE_OTHER)
Admission: EM | Admit: 2015-11-26 | Discharge: 2015-11-26 | Disposition: A | Discharge status: Home / Self Care | Payer: 59 | Source: Home / Self Care | Attending: Family Medicine | Admitting: Family Medicine

## 2015-11-26 DIAGNOSIS — M79661 Pain in right lower leg: Secondary | ICD-10-CM | POA: Insufficient documentation

## 2015-11-26 DIAGNOSIS — M79604 Pain in right leg: Secondary | ICD-10-CM

## 2015-11-26 NOTE — ED Triage Notes (Signed)
Pt Started having knee pain last Thursday, it is from the knee down the front of the shin and around to the calf.  Pt stated that Friday it was warm to the touch.  Denies redness, swelling, and injury, however she hit it with a hook she uses at work about 3 weeks ago, but doesn't feel that it is from that.

## 2015-11-26 NOTE — ED Provider Notes (Signed)
04/CSN: [id number]     Arrival date & time 11/26/15  1856 History   First MD Initiated Contact with Patient 11/26/15 1919     Chief Complaint  Patient presents with  . Knee Pain   (Consider location/radiation/quality/duration/timing/severity/associated sxs/prior Treatment) HPI  Darlene Patterson is a 45 y.o. female presenting to [facility] with c/o gradually worsening Right leg pain that started with her knee.  Pt states 3 weeks ago she was hit with a metal hook at work in her Right knee.  Pain was minimal at that time but now she has pain to the front of her Right knee that radiates into her calf.  This pain started 4 days ago, throbbing, 7/10, worse with palpation to her calf and worse when walking.  She has been elevating it but states she is concerned she may have a blood clot as it felt warm to the touch yesterday.  No prior hx of blood clots. Denies chest pain or SOB.  No recent travel or surgeries.    Past Medical History:  Diagnosis Date  . Anxiety    with anesthesia  . Complication of anesthesia   . Great toe pain    cyst left great toe- to be excised 05-24-14- [place]  . H/O hiatal hernia    per upper GI 3/13  . Heart murmur    early teens- resolved  . Kidney stone    presently has stent-surgery planned  . Obesity   . Peripheral vascular disease (HCC)    varicose veins- left  . PONV (postoperative nausea and vomiting)    'SEVERE PER PT"  . Status post gastric banding    Past Surgical History:  Procedure Laterality Date  . ABDOMINAL HYSTERECTOMY  3/13  . ACHILLES TENDON REPAIR  08, 09   bilateral  . BREATH TEK H PYLORI  05/21/2011   Procedure: BREATH TEK H PYLORI;  Surgeon: Valarie Merino, MD;  Location: [facility];  Service: General;  Laterality: N/A;  . CESAREAN SECTION  89, 93  . CHOLECYSTECTOMY  2011  . CYSTOSCOPY W/ URETERAL STENT PLACEMENT Left 05/16/2014   Procedure: CYSTOSCOPY WITH RETROGRADE PYELOGRAM/URETERAL STENT PLACEMENT;  Surgeon: Crist Fat, MD;   Location: [facility];  Service: Urology;  Laterality: Left;  . CYSTOSCOPY WITH URETEROSCOPY AND STENT PLACEMENT Left 05/31/2014   Procedure: LEFT URETEROSCOPY AND STENT EXCHANGE, LEFT RETROGRADE;  Surgeon: Crist Fat, MD;  Location: [facility];  Service: Urology;  Laterality: Left;  . EYE SURGERY  75,85   repair lazy eye  . HIATAL HERNIA REPAIR  10/07/2011   Procedure: LAPAROSCOPIC REPAIR OF HIATAL HERNIA;  Surgeon: Valarie Merino, MD;  Location: [facility];  Service: General;  Laterality: N/A;  . LAPAROSCOPIC GASTRIC BANDING  10/07/2011   Procedure: LAPAROSCOPIC GASTRIC BANDING;  Surgeon: Valarie Merino, MD;  Location: [facility];  Service: General;  Laterality: N/A;  . VASCULAR SURGERY     left leg- s/p  injections of veins   Family History  Problem Relation Age of Onset  . COPD Mother   . Hypertension Sister    Social History  Substance Use Topics  . Smoking status: Never Smoker  . Smokeless tobacco: Never Used  . Alcohol use Yes     Comment: 2-3 beers on 2 weekends/month   OB History    No data available     Review of Systems  Musculoskeletal: Positive for arthralgias, gait problem and myalgias. Negative for back pain and joint swelling.  Skin: Negative  for color change, rash and wound.  Neurological: Negative for weakness and numbness.    Allergies  Carbon dioxide  Home Medications   Prior to Admission medications   Medication Sig Start Date End Date Taking? Authorizing Provider  acetaminophen-codeine (TYLENOL #3) 300-30 MG per tablet Take 1 tablet by mouth every 4 (four) hours as needed. 05/31/14   Crist FatBenjamin W Herrick, MD  aspirin 81 MG tablet Take 81 mg by mouth every morning.     Historical Provider, MD  cetirizine (ZYRTEC) 10 MG tablet Take 10 mg by mouth daily.     Historical Provider, MD  ciprofloxacin (CIPRO) 500 MG tablet Take 1 tablet (500 mg total) by mouth once. 05/31/14   Crist FatBenjamin W Herrick, MD  Cyanocobalamin (VITAMIN B-12) 500 MCG SUBL Place 1 tablet under the  tongue every morning.    Historical Provider, MD  FLAXSEED, LINSEED, PO Take 1 tablet by mouth daily.     Historical Provider, MD  gentamicin (GARAMYCIN) 0.3 % ophthalmic solution Place 2 drops into the left eye every 4 (four) hours. For 5 days 01/22/15   Junius FinnerErin O'Malley, PA-C  magnesium oxide (MAG-OX) 400 MG tablet Take 400 mg by mouth daily.    Historical Provider, MD  multivitamin-iron-minerals-folic acid (CENTRUM) chewable tablet Chew 1 tablet by mouth every morning.     Historical Provider, MD  phenazopyridine (PYRIDIUM) 200 MG tablet Take 1 tablet (200 mg total) by mouth 3 (three) times daily as needed for pain. Patient not taking: Reported on 05/23/2014 05/16/14   Crist FatBenjamin W Herrick, MD  tamsulosin [facility]FLOMAX) 0.4 MG CAPS capsule Take 1 capsule (0.4 mg total) by mouth daily. 05/31/14   Crist FatBenjamin W Herrick, MD  trimethoprim-polymyxin b Joaquim Lai(POLYTRIM) ophthalmic solution Place 1 drop into the left eye every 6 (six) hours. For 5 days 12/29/14   Junius FinnerErin O'Malley, PA-C  Trospium Chloride 60 MG CP24 Take 1 capsule (60 mg total) by mouth daily. 05/31/14   Crist FatBenjamin W Herrick, MD   Meds Ordered and Administered this Visit  Medications - No data to display  BP 126/83 (BP Location: Left Arm)   Pulse 77   Temp 98.7 F (37.1 C) (Oral)   Ht 5\' 7"  (1.702 m)   Wt 264 lb 6.4 oz (119.9 kg)   LMP 06/01/2011   SpO2 99%   BMI 41.41 kg/m  No data found.   Physical Exam  Constitutional: She is oriented to person, place, and time. She appears well-developed and well-nourished.  HENT:  Head: Normocephalic and atraumatic.  Eyes: EOM are normal.  Neck: Normal range of motion.  Cardiovascular: Normal rate.   Pulmonary/Chest: Effort normal.  Musculoskeletal: Normal range of motion. She exhibits tenderness. She exhibits no edema or deformity.  Right knee: no deformity or edema, tenderness to anterior aspect. Full ROM w/o crepitus.  Right calf: soft, diffuse tenderness, worse to distal calf. Full ROM Right ankle.    Neurological: She is alert and oriented to person, place, and time.  Normal gait.  Skin: Skin is warm and dry. Capillary refill takes less than 2 seconds. No rash noted. No erythema.  Right leg: skin in tact, no erythema, ecchymosis, or warmth.   Psychiatric: She has a normal mood and affect. Her behavior is normal.  Nursing note and vitals reviewed.   Urgent Care Course   Clinical Course    Procedures (including critical care time)  Labs Review Labs Reviewed - No data to display  Imaging Review [place] Venous Img Lower Unilateral Right  Result Date:  11/26/2015 CLINICAL DATA:  Right lower extremity pain for 2 weeks. EXAM: Right LOWER EXTREMITY VENOUS DOPPLER ULTRASOUND TECHNIQUE: Gray-scale sonography with graded compression, as well as color Doppler and duplex ultrasound were performed to evaluate the lower extremity deep venous systems from the level of the common femoral vein and including the common femoral, femoral, profunda femoral, popliteal and calf veins including the posterior tibial, peroneal and gastrocnemius veins when visible. The superficial great saphenous vein was also interrogated. Spectral Doppler was utilized to evaluate flow at rest and with distal augmentation maneuvers in the common femoral, femoral and popliteal veins. COMPARISON:  None. FINDINGS: Contralateral Common Femoral Vein: Respiratory phasicity is normal and symmetric with the symptomatic side. No evidence of thrombus. Normal compressibility. Common Femoral Vein: No evidence of thrombus. Normal compressibility, respiratory phasicity and response to augmentation. Saphenofemoral Junction: No evidence of thrombus. Normal compressibility and flow on color Doppler imaging. Profunda Femoral Vein: No evidence of thrombus. Normal compressibility and flow on color Doppler imaging. Femoral Vein: No evidence of thrombus. Normal compressibility, respiratory phasicity and response to augmentation. Popliteal Vein: No evidence of  thrombus. Normal compressibility, respiratory phasicity and response to augmentation. Calf Veins: No evidence of thrombus. Normal compressibility and flow on color Doppler imaging. Superficial Great Saphenous Vein: No evidence of thrombus. Normal compressibility and flow on color Doppler imaging. Venous Reflux:  None. Other Findings:  None. IMPRESSION: No evidence of deep venous thrombosis. Electronically Signed   By: Burman Nieves M.D.   On: 11/26/2015 21:51     MDM   1. Right leg pain   2. Pain in right lower leg    Pt c/o Right leg pain that has gradually worsened over the last 4 days.  She cannot recall any injury except being hit by a metal hook 3 weeks ago.  No prior hx of blood clots.  Pt sent to [facility] for imaging.  U/S: No evidence of DVT  Pain could be due to pt having recent knee pain and unconsciously altering gait due to knee pain.  Encouraged rest, ice, compression, elevation. May use warm compresses if more comfortable.  Acetaminophen and ibuprofen for pain. F/u with PCP as needed.    Junius Finner, PA-C 11/27/15 [phone]

## 2015-11-26 NOTE — Discharge Instructions (Signed)
°  Please register through the emergency department and ultrasound.  They are aware you have been sent over.  Imaging is waiting on you, however, they will need to finish on patients who arrived before you.

## 2015-11-29 ENCOUNTER — Telehealth: Payer: Self-pay | Admitting: Emergency Medicine

## 2015-11-29 NOTE — Telephone Encounter (Signed)
Inquired about patient's status; encourage them to call with questions/concerns.  

## 2015-12-25 ENCOUNTER — Other Ambulatory Visit: Payer: Self-pay | Admitting: Obstetrics and Gynecology

## 2015-12-25 DIAGNOSIS — Z1231 Encounter for screening mammogram for malignant neoplasm of breast: Secondary | ICD-10-CM

## 2015-12-26 ENCOUNTER — Ambulatory Visit
Admission: RE | Admit: 2015-12-26 | Discharge: 2015-12-26 | Disposition: A | Discharge status: Home / Self Care | Payer: 59 | Source: Ambulatory Visit | Attending: Obstetrics and Gynecology | Admitting: Obstetrics and Gynecology

## 2015-12-26 DIAGNOSIS — Z1231 Encounter for screening mammogram for malignant neoplasm of breast: Secondary | ICD-10-CM

## 2016-05-06 ENCOUNTER — Encounter (HOSPITAL_COMMUNITY): Payer: Self-pay

## 2017-02-02 ENCOUNTER — Other Ambulatory Visit: Payer: Self-pay | Admitting: Obstetrics and Gynecology

## 2017-02-02 DIAGNOSIS — Z1231 Encounter for screening mammogram for malignant neoplasm of breast: Secondary | ICD-10-CM

## 2017-02-27 ENCOUNTER — Ambulatory Visit
Admission: RE | Admit: 2017-02-27 | Discharge: 2017-02-27 | Disposition: A | Discharge status: Home / Self Care | Payer: POS | Source: Ambulatory Visit | Attending: Obstetrics and Gynecology | Admitting: Obstetrics and Gynecology

## 2017-02-27 DIAGNOSIS — Z1231 Encounter for screening mammogram for malignant neoplasm of breast: Secondary | ICD-10-CM

## 2017-05-13 ENCOUNTER — Encounter (HOSPITAL_COMMUNITY): Payer: Self-pay

## 2020-01-09 ENCOUNTER — Other Ambulatory Visit: Payer: Self-pay | Admitting: Obstetrics and Gynecology

## 2020-01-09 DIAGNOSIS — Z1231 Encounter for screening mammogram for malignant neoplasm of breast: Secondary | ICD-10-CM

## 2020-01-10 ENCOUNTER — Ambulatory Visit
Admission: RE | Admit: 2020-01-10 | Discharge: 2020-01-10 | Disposition: A | Discharge status: Home / Self Care | Payer: POS | Source: Ambulatory Visit | Attending: Obstetrics and Gynecology | Admitting: Obstetrics and Gynecology

## 2020-01-10 ENCOUNTER — Other Ambulatory Visit: Payer: Self-pay

## 2020-01-10 DIAGNOSIS — Z1231 Encounter for screening mammogram for malignant neoplasm of breast: Secondary | ICD-10-CM

## 2020-12-12 ENCOUNTER — Other Ambulatory Visit: Payer: Self-pay | Admitting: Obstetrics and Gynecology

## 2020-12-12 DIAGNOSIS — Z1231 Encounter for screening mammogram for malignant neoplasm of breast: Secondary | ICD-10-CM

## 2021-01-11 ENCOUNTER — Other Ambulatory Visit: Payer: Self-pay

## 2021-01-11 ENCOUNTER — Ambulatory Visit
Admission: RE | Admit: 2021-01-11 | Discharge: 2021-01-11 | Disposition: A | Discharge status: Home / Self Care | Payer: POS | Source: Ambulatory Visit | Attending: Obstetrics and Gynecology | Admitting: Obstetrics and Gynecology

## 2021-01-11 DIAGNOSIS — Z1231 Encounter for screening mammogram for malignant neoplasm of breast: Secondary | ICD-10-CM

## 2022-03-12 ENCOUNTER — Emergency Department (HOSPITAL_BASED_OUTPATIENT_CLINIC_OR_DEPARTMENT_OTHER): Payer: 59

## 2022-03-12 ENCOUNTER — Emergency Department (HOSPITAL_BASED_OUTPATIENT_CLINIC_OR_DEPARTMENT_OTHER)
Admission: EM | Admit: 2022-03-12 | Discharge: 2022-03-12 | Disposition: A | Discharge status: Home / Self Care | Payer: 59 | Attending: Emergency Medicine | Admitting: Emergency Medicine

## 2022-03-12 ENCOUNTER — Other Ambulatory Visit: Payer: Self-pay

## 2022-03-12 ENCOUNTER — Encounter (HOSPITAL_BASED_OUTPATIENT_CLINIC_OR_DEPARTMENT_OTHER): Payer: Self-pay | Admitting: Emergency Medicine

## 2022-03-12 DIAGNOSIS — R202 Paresthesia of skin: Secondary | ICD-10-CM | POA: Insufficient documentation

## 2022-03-12 DIAGNOSIS — R221 Localized swelling, mass and lump, neck: Secondary | ICD-10-CM | POA: Diagnosis present

## 2022-03-12 LAB — CBC
HCT: 41.6 % (ref 36.0–46.0)
Hemoglobin: 13.7 g/dL (ref 12.0–15.0)
MCH: 28.1 pg (ref 26.0–34.0)
MCHC: 32.9 g/dL (ref 30.0–36.0)
MCV: 85.4 fL (ref 80.0–100.0)
Platelets: 311 10*3/uL (ref 150–400)
RBC: 4.87 MIL/uL (ref 3.87–5.11)
RDW: 13.3 % (ref 11.5–15.5)
WBC: 6.9 10*3/uL (ref 4.0–10.5)
nRBC: 0 % (ref 0.0–0.2)

## 2022-03-12 LAB — URINALYSIS, ROUTINE W REFLEX MICROSCOPIC
Bilirubin Urine: NEGATIVE
Glucose, UA: NEGATIVE mg/dL
Ketones, ur: NEGATIVE mg/dL
Nitrite: NEGATIVE
Protein, ur: NEGATIVE mg/dL
Specific Gravity, Urine: 1.015 (ref 1.005–1.030)
pH: 8.5 — ABNORMAL HIGH (ref 5.0–8.0)

## 2022-03-12 LAB — RAPID URINE DRUG SCREEN, HOSP PERFORMED
Amphetamines: NOT DETECTED
Barbiturates: NOT DETECTED
Benzodiazepines: NOT DETECTED
Cocaine: NOT DETECTED
Opiates: NOT DETECTED
Tetrahydrocannabinol: NOT DETECTED

## 2022-03-12 LAB — APTT: aPTT: 31 seconds (ref 24–36)

## 2022-03-12 LAB — COMPREHENSIVE METABOLIC PANEL
ALT: 26 U/L (ref 0–44)
AST: 32 U/L (ref 15–41)
Albumin: 3.6 g/dL (ref 3.5–5.0)
Alkaline Phosphatase: 69 U/L (ref 38–126)
Anion gap: 12 (ref 5–15)
BUN: 11 mg/dL (ref 6–20)
CO2: 24 mmol/L (ref 22–32)
Calcium: 8.9 mg/dL (ref 8.9–10.3)
Chloride: 102 mmol/L (ref 98–111)
Creatinine, Ser: 0.68 mg/dL (ref 0.44–1.00)
GFR, Estimated: >60 mL/min (ref >60)
Glucose, Bld: 119 mg/dL — ABNORMAL HIGH (ref 70–99)
Potassium: 3.6 mmol/L (ref 3.5–5.1)
Sodium: 138 mmol/L (ref 135–145)
Total Bilirubin: 0.4 mg/dL (ref 0.3–1.2)
Total Protein: 7.6 g/dL (ref 6.5–8.1)

## 2022-03-12 LAB — DIFFERENTIAL
Abs Immature Granulocytes: 0.02 10*3/uL (ref 0.00–0.07)
Basophils Absolute: 0 10*3/uL (ref 0.0–0.1)
Basophils Relative: 1 %
Eosinophils Absolute: 0.3 10*3/uL (ref 0.0–0.5)
Eosinophils Relative: 5 %
Immature Granulocytes: 0 %
Lymphocytes Relative: 33 %
Lymphs Abs: 2.3 10*3/uL (ref 0.7–4.0)
Monocytes Absolute: 0.6 10*3/uL (ref 0.1–1.0)
Monocytes Relative: 8 %
Neutro Abs: 3.7 10*3/uL (ref 1.7–7.7)
Neutrophils Relative %: 53 %

## 2022-03-12 LAB — PREGNANCY, URINE: Preg Test, Ur: NEGATIVE

## 2022-03-12 LAB — ETHANOL: Alcohol, Ethyl (B): <10 mg/dL (ref <10)

## 2022-03-12 LAB — URINALYSIS, MICROSCOPIC (REFLEX)

## 2022-03-12 LAB — PROTIME-INR
INR: 1 (ref 0.8–1.2)
Prothrombin Time: 13.5 seconds (ref 11.4–15.2)

## 2022-03-12 MED ORDER — IOHEXOL 300 MG/ML  SOLN: 100.0000 mL | Freq: Once | INTRAMUSCULAR | Status: DC | PRN

## 2022-03-12 MED ORDER — IOHEXOL 350 MG/ML SOLN
100.0000 mL | Freq: Once | INTRAVENOUS | Status: AC | PRN
  Administered 2022-03-12: 75 mL via INTRAVENOUS

## 2022-03-12 NOTE — ED Provider Notes (Signed)
Received patient in turnover from Dr. Laverta Baltimore.  Please see their note for further details of Hx, PE.  Briefly patient is a 52 y.o. female with a Numbness .  She describes some acute swelling to the right angle of the jaw with some tingling at that location but also some that went down the right arm.  Lasted for less than 30 minutes and has completely resolved.  Plan to obtain CT imaging of the head and neck and if negative likely discussed with neurology.  The patient's CT imaging is negative for obvious acute pathology.  I discussed this with Dr. Maurine Minister, neurology.  Based on her very atypical symptoms having a low risk for stroke based on her risk factors felt safe for discharge and close follow-up as an outpatient with her PCP.    Deno Etienne, DO 03/12/22 1628

## 2022-03-12 NOTE — ED Triage Notes (Signed)
Pt reports she was at a restaurant and had RT side facial and RUE numbness 30 min PTA; arm numbness has resolved; pt sts noticed sudden swelling of RT side jaw; still feels "a little tingly"

## 2022-03-12 NOTE — ED Triage Notes (Addendum)
This Probation officer did a quick assessment d/t complaint.  Pt reports she was eating at a local restaurant and started having some pain in R jaw w/ chewing and noticed some R side swelling to neck.  Also, Pt reports "a little tingling" down R arm.  Denies weakness.  Facial symmetry noted.  Speech is clear.

## 2022-03-12 NOTE — Discharge Instructions (Signed)
We are not sure of the cause of your symptoms  It could be a salivary duct stone and inflammation.  Please call your family doctor tomorrow and let them know about your visit here and see if they want to see you in the office.  Please return for any recurrent or worsening symptoms.

## 2022-03-12 NOTE — ED Provider Notes (Signed)
Emergency Department Provider Note   I have reviewed the triage vital signs and the nursing notes.   HISTORY  Chief Complaint Numbness   HPI Darlene Patterson is a 52 y.o. female with past history reviewed below presents emergency department acute onset swelling to the right jaw.  Patient was at a restaurant eating when symptoms began suddenly.  She states she has had fairly significant swelling along with some discomfort and numbness just to this focal area.  She felt some tingling into the right upper arm as well along with some mild vertigo.   {**SYMPTOM/COMPLAINT  LOCATION (describe anatomically) DURATION (when did it start) TIMING (onset and pattern) SEVERITY (0-10, mild/moderate/severe) QUALITY (description of symptoms) CONTEXT (recent surgery, new meds, activity, etc.) MODIFYINGFACTORS (what makes it better/worse) ASSOCIATEDSYMPTOMS (pertinent positives and negatives)**}  Past Medical History:  Diagnosis Date   Anxiety    with anesthesia   Complication of anesthesia    Great toe pain    cyst left great toe- to be excised 05-24-14- [place]   H/O hiatal hernia    per upper GI 3/13   Heart murmur    early teens- resolved   Kidney stone    presently has stent-surgery planned   Obesity    Peripheral vascular disease ([place])    varicose veins- left   PONV (postoperative nausea and vomiting)    'SEVERE PER PT"   Status post gastric banding     Review of Systems {** Revise as appropriate then delete this line - Documentation of 10 systems OR 2 systems and "10-point ROS otherwise negative" is required **}Constitutional: No fever/chills Eyes: No visual changes. ENT: No sore throat. Cardiovascular: Denies chest pain. Respiratory: Denies shortness of breath. Gastrointestinal: No abdominal pain.  No nausea, no vomiting.  No diarrhea.  No constipation. Genitourinary: Negative for dysuria. Musculoskeletal: Negative for back pain. Skin: Negative for  rash. Neurological: Negative for headaches, focal weakness or numbness. {**Psychiatric:  Endocrine:  Hematological/Lymphatic:  Allergic/Immunilogical: **}  ____________________________________________   PHYSICAL EXAM:  VITAL SIGNS: ED Triage Vitals  Enc Vitals Group     BP 03/12/22 1356 (!) 161/93     Pulse Rate 03/12/22 1356 85     Resp 03/12/22 1356 20     Temp 03/12/22 1356 98 F (36.7 C)     Temp src --      SpO2 03/12/22 1356 98 %     Weight 03/12/22 1357 280 lb (127 kg)     Height 03/12/22 1357 5\' 7"  (1.702 m)     Head Circumference --      Peak Flow --      Pain Score 03/12/22 1357 0     Pain Loc --      Pain Edu? --      Excl. in [place]? --    {** Revise as appropriate then delete this line - 8 systems required **} Constitutional: Alert and oriented. Well appearing and in no acute distress. Eyes: Conjunctivae are normal. PERRL. EOMI. Head: Atraumatic. {**Ears:  Healthy appearing ear canals and TMs bilaterally **}Nose: No congestion/rhinnorhea. Mouth/Throat: Mucous membranes are moist.  Oropharynx non-erythematous. Neck: No stridor.  No meningeal signs.  {**No cervical spine tenderness to palpation.**} Cardiovascular: Normal rate, regular rhythm. Good peripheral circulation. Grossly normal heart sounds.   Respiratory: Normal respiratory effort.  No retractions. Lungs CTAB. Gastrointestinal: Soft and nontender. No distention.  {**Genitourinary:  **}Musculoskeletal: No lower extremity tenderness nor edema. No gross deformities of extremities. Neurologic:  Normal speech and language.  No gross focal neurologic deficits are appreciated.  Skin:  Skin is warm, dry and intact. No rash noted. {**Psychiatric: Mood and affect are normal. Speech and behavior are normal.**}  ____________________________________________   LABS (all labs ordered are listed, but only abnormal results are displayed)  Labs Reviewed - No data to  display ____________________________________________  EKG  *** ____________________________________________  RADIOLOGY  No results found.  ____________________________________________   PROCEDURES  Procedure(s) performed:   Procedures   ____________________________________________   INITIAL IMPRESSION / ASSESSMENT AND PLAN / ED COURSE  Pertinent labs & imaging results that were available during my care of the patient were reviewed by me and considered in my medical decision making (see chart for details).   This patient is Presenting for Evaluation of ***, which {Range:23949} require a range of treatment options, and {MDMcomplaint:23950} a complaint that involves a {MDMlevelrisk:23951} risk of morbidity and mortality.  The Differential Diagnoses include***.  Critical Interventions-    Medications - No data to display  Reassessment after intervention:     I *** Additional Historical Information from ***, as the patient is ***.  I decided to review pertinent External Data, and in summary ***.   Clinical Laboratory Tests Ordered, included   Radiologic Tests Ordered, included ***. I independently interpreted the images and agree with radiology interpretation.   Cardiac Monitor Tracing which shows ***   Social Determinants of Health Risk ***  Consult complete with  Medical Decision Making: Summary: ***  Reevaluation with update and discussion with   ***Considered admission***  Patient's presentation is most consistent with {EM COPA:27473}   Disposition:   ____________________________________________  FINAL CLINICAL IMPRESSION(S) / ED DIAGNOSES  Final diagnoses:  None     NEW OUTPATIENT MEDICATIONS STARTED DURING THIS VISIT:  New Prescriptions   No medications on file    Note:  This document was prepared using Dragon voice recognition software and may include unintentional dictation errors.  Nanda Quinton, MD, [facility] Emergency Medicine

## 2022-03-12 NOTE — ED Notes (Signed)
Patient transported to CT 

## 2023-02-03 ENCOUNTER — Other Ambulatory Visit: Payer: Self-pay | Admitting: Obstetrics and Gynecology

## 2023-02-03 DIAGNOSIS — Z1231 Encounter for screening mammogram for malignant neoplasm of breast: Secondary | ICD-10-CM

## 2023-02-13 ENCOUNTER — Ambulatory Visit
Admission: RE | Admit: 2023-02-13 | Discharge: 2023-02-13 | Disposition: A | Discharge status: Home / Self Care | Payer: 59 | Source: Ambulatory Visit | Attending: Obstetrics and Gynecology | Admitting: Obstetrics and Gynecology

## 2023-02-13 DIAGNOSIS — Z1231 Encounter for screening mammogram for malignant neoplasm of breast: Secondary | ICD-10-CM

## 2023-02-18 ENCOUNTER — Other Ambulatory Visit: Payer: Self-pay | Admitting: Obstetrics and Gynecology

## 2023-02-18 DIAGNOSIS — R928 Other abnormal and inconclusive findings on diagnostic imaging of breast: Secondary | ICD-10-CM

## 2023-03-10 ENCOUNTER — Ambulatory Visit
Admission: RE | Admit: 2023-03-10 | Discharge: 2023-03-10 | Disposition: A | Discharge status: Home / Self Care | Payer: 59 | Source: Ambulatory Visit | Attending: Obstetrics and Gynecology | Admitting: Obstetrics and Gynecology

## 2023-03-10 ENCOUNTER — Other Ambulatory Visit: Payer: Self-pay | Admitting: Obstetrics and Gynecology

## 2023-03-10 DIAGNOSIS — R928 Other abnormal and inconclusive findings on diagnostic imaging of breast: Secondary | ICD-10-CM

## 2023-03-10 DIAGNOSIS — N6310 Unspecified lump in the right breast, unspecified quadrant: Secondary | ICD-10-CM

## 2023-03-11 DIAGNOSIS — C801 Malignant (primary) neoplasm, unspecified: Secondary | ICD-10-CM

## 2023-03-11 HISTORY — DX: Malignant (primary) neoplasm, unspecified: C80.1

## 2023-03-12 ENCOUNTER — Ambulatory Visit
Admission: RE | Admit: 2023-03-12 | Discharge: 2023-03-12 | Disposition: A | Discharge status: Home / Self Care | Payer: 59 | Source: Ambulatory Visit | Attending: Obstetrics and Gynecology | Admitting: Obstetrics and Gynecology

## 2023-03-12 DIAGNOSIS — N6310 Unspecified lump in the right breast, unspecified quadrant: Secondary | ICD-10-CM

## 2023-03-12 HISTORY — PX: BREAST BIOPSY: SHX20

## 2023-03-13 ENCOUNTER — Telehealth: Payer: Self-pay | Admitting: *Deleted

## 2023-03-13 LAB — SURGICAL PATHOLOGY

## 2023-03-13 NOTE — Telephone Encounter (Signed)
 Spoke to patient to confirm upcoming afternoon [employer] clinic appointment on 1/8, paperwork will be sent via email  Gave location and time, also informed patient that the surgeon's office would be calling as well to get information from them similar to the packet that they will be receiving so make sure to do both.  Reminded patient that all providers will be coming to the clinic to see them HERE and if they had any questions to not hesitate to reach back out to myself or their navigators.

## 2023-03-16 ENCOUNTER — Encounter: Payer: Self-pay | Admitting: *Deleted

## 2023-03-17 ENCOUNTER — Other Ambulatory Visit: Payer: Self-pay | Admitting: *Deleted

## 2023-03-17 DIAGNOSIS — Z170 Estrogen receptor positive status [ER+]: Secondary | ICD-10-CM | POA: Insufficient documentation

## 2023-03-17 DIAGNOSIS — C50211 Malignant neoplasm of upper-inner quadrant of right female breast: Secondary | ICD-10-CM | POA: Insufficient documentation

## 2023-03-18 ENCOUNTER — Encounter: Payer: Self-pay | Admitting: General Practice

## 2023-03-18 ENCOUNTER — Encounter: Payer: Self-pay | Admitting: *Deleted

## 2023-03-18 ENCOUNTER — Encounter: Payer: Self-pay | Admitting: Physical Therapy

## 2023-03-18 ENCOUNTER — Other Ambulatory Visit: Payer: Self-pay

## 2023-03-18 ENCOUNTER — Ambulatory Visit
Admission: RE | Admit: 2023-03-18 | Discharge: 2023-03-18 | Disposition: A | Discharge status: Home / Self Care | Payer: 59 | Source: Ambulatory Visit | Attending: Radiation Oncology | Admitting: Radiation Oncology

## 2023-03-18 ENCOUNTER — Other Ambulatory Visit: Payer: Self-pay | Admitting: General Surgery

## 2023-03-18 ENCOUNTER — Ambulatory Visit: Payer: Commercial Managed Care - PPO | Attending: General Surgery | Admitting: Physical Therapy

## 2023-03-18 ENCOUNTER — Inpatient Hospital Stay: Payer: Commercial Managed Care - PPO | Attending: Hematology and Oncology | Admitting: Hematology and Oncology

## 2023-03-18 ENCOUNTER — Inpatient Hospital Stay (HOSPITAL_BASED_OUTPATIENT_CLINIC_OR_DEPARTMENT_OTHER): Payer: Commercial Managed Care - PPO | Admitting: Genetic Counselor

## 2023-03-18 ENCOUNTER — Inpatient Hospital Stay: Payer: Commercial Managed Care - PPO

## 2023-03-18 VITALS — BP 150/96 | HR 107 | Temp 98.2°F | Resp 18 | Ht 67.0 in | Wt 271.4 lb

## 2023-03-18 DIAGNOSIS — Z170 Estrogen receptor positive status [ER+]: Secondary | ICD-10-CM

## 2023-03-18 DIAGNOSIS — Z1721 Progesterone receptor positive status: Secondary | ICD-10-CM | POA: Insufficient documentation

## 2023-03-18 DIAGNOSIS — Z90710 Acquired absence of both cervix and uterus: Secondary | ICD-10-CM | POA: Insufficient documentation

## 2023-03-18 DIAGNOSIS — Z1732 Human epidermal growth factor receptor 2 negative status: Secondary | ICD-10-CM | POA: Diagnosis not present

## 2023-03-18 DIAGNOSIS — C50211 Malignant neoplasm of upper-inner quadrant of right female breast: Secondary | ICD-10-CM

## 2023-03-18 DIAGNOSIS — R293 Abnormal posture: Secondary | ICD-10-CM | POA: Diagnosis present

## 2023-03-18 LAB — CMP (CANCER CENTER ONLY)
ALT: 13 U/L (ref 0–44)
AST: 13 U/L — ABNORMAL LOW (ref 15–41)
Albumin: 4.2 g/dL (ref 3.5–5.0)
Alkaline Phosphatase: 75 U/L (ref 38–126)
Anion gap: 8 (ref 5–15)
BUN: 9 mg/dL (ref 6–20)
CO2: 24 mmol/L (ref 22–32)
Calcium: 9.5 mg/dL (ref 8.9–10.3)
Chloride: 106 mmol/L (ref 98–111)
Creatinine: 0.82 mg/dL (ref 0.44–1.00)
GFR, Estimated: >60 mL/min (ref >60)
Glucose, Bld: 107 mg/dL — ABNORMAL HIGH (ref 70–99)
Potassium: 4 mmol/L (ref 3.5–5.1)
Sodium: 138 mmol/L (ref 135–145)
Total Bilirubin: 0.6 mg/dL (ref 0.0–1.2)
Total Protein: 7.6 g/dL (ref 6.5–8.1)

## 2023-03-18 LAB — CBC WITH DIFFERENTIAL (CANCER CENTER ONLY)
Abs Immature Granulocytes: 0.04 10*3/uL (ref 0.00–0.07)
Basophils Absolute: 0.1 10*3/uL (ref 0.0–0.1)
Basophils Relative: 1 %
Eosinophils Absolute: 0.2 10*3/uL (ref 0.0–0.5)
Eosinophils Relative: 2 %
HCT: 42.6 % (ref 36.0–46.0)
Hemoglobin: 14.4 g/dL (ref 12.0–15.0)
Immature Granulocytes: 0 %
Lymphocytes Relative: 18 %
Lymphs Abs: 1.8 10*3/uL (ref 0.7–4.0)
MCH: 28.4 pg (ref 26.0–34.0)
MCHC: 33.8 g/dL (ref 30.0–36.0)
MCV: 84 fL (ref 80.0–100.0)
Monocytes Absolute: 0.7 10*3/uL (ref 0.1–1.0)
Monocytes Relative: 7 %
Neutro Abs: 7.2 10*3/uL (ref 1.7–7.7)
Neutrophils Relative %: 72 %
Platelet Count: 341 10*3/uL (ref 150–400)
RBC: 5.07 MIL/uL (ref 3.87–5.11)
RDW: 13.9 % (ref 11.5–15.5)
WBC Count: 10 10*3/uL (ref 4.0–10.5)
nRBC: 0 % (ref 0.0–0.2)

## 2023-03-18 LAB — GENETIC SCREENING ORDER

## 2023-03-18 NOTE — Progress Notes (Signed)
 [facility] Spiritual Care Note  Met with Miasia Crabtree and her husband and friend in Breast Multidisciplinary Clinic to introduce Support Center team/resources.  she completed SDOH screening; results follow below.    SDOH Screenings   Food Insecurity: No Food Insecurity (03/18/2023)  Housing: Unknown (03/18/2023)  Transportation Needs: No Transportation Needs (03/18/2023)  Utilities: Not At Risk (03/18/2023)  Depression (PHQ2-9): Low Risk  (03/18/2023)  Social Connections: Unknown (07/23/2021)   [facility] from [facility]  Tobacco Use: Low Risk  (03/18/2023)   Received from [facility] and patient discussed common feelings and emotions when being diagnosed with cancer, and the importance of support during treatment.  Chaplain informed patient of the support team and support services at [facility].  Chaplain provided contact information and encouraged patient to call with any questions or concerns.  Darlene Patterson is feeling relieved after meeting her team and learning the scope of her diagnosis and treatment. She welcomes a check-in call in ca 2 weeks.  Follow up needed: Yes.  Darlene Applin works for the [employer] and prefers a call between 9am and 1pm.   Elia Olam Corrigan, MDiv, [facility] Pager [phone] Voicemail [phone]

## 2023-03-18 NOTE — Progress Notes (Signed)
 Radiation Oncology         [phone] ________________________________  Name: Darlene Patterson        MRN: [id number]  Date of Service: 03/18/2023 DOB: [birth year]  RR:Mziipwh, Norleen PHEBE HEATH, MD  Ebbie Cough, MD     REFERRING PHYSICIAN: Ebbie Cough, MD   DIAGNOSIS: The encounter diagnosis was Malignant neoplasm of upper-inner quadrant of right breast in female, estrogen receptor positive (HCC).   HISTORY OF PRESENT ILLNESS: Darlene Patterson is a 53 y.o. female seen in the multidisciplinary breast clinic for a new diagnosis of right breast cancer. The patient was noted to have screening detected architectural distortion in the right breast. By ultrasound the lesion was seen in the  1:00 position measuring 6 mm and the axilla was negative for adenopathy. There were tiny appearing benign appearing cysts in the 12:00 position. 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 She's seen to discuss treatment recommendations of her cancer.  PREVIOUS RADIATION THERAPY: No   PAST MEDICAL HISTORY:  Past Medical History:  Diagnosis Date   Anxiety    with anesthesia   Complication of anesthesia    Great toe pain    cyst left great toe- to be excised 05-24-14- [place]   H/O hiatal hernia    per upper GI 3/13   Heart murmur    early teens- resolved   Kidney stone    presently has stent-surgery planned   Obesity    Peripheral vascular disease (HCC)    varicose veins- left   PONV (postoperative nausea and vomiting)    'SEVERE PER PT   Status post gastric banding        PAST SURGICAL HISTORY: Past Surgical History:  Procedure Laterality Date   ABDOMINAL HYSTERECTOMY  3/13   ACHILLES TENDON REPAIR  08, 09   bilateral   BREAST BIOPSY Right 03/12/2023   US  RT BREAST BX W LOC DEV 1ST LESION IMG BX SPEC US  GUIDE 03/12/2023 [facility] MAMMOGRAPHY   BREATH TEK H PYLORI  05/21/2011   Procedure: BREATH TEK H  PYLORI;  Surgeon: Cough KATHEE Lunger, MD;  Location: [facility];  Service: General;  Laterality: N/A;   CESAREAN SECTION  89, 93   CHOLECYSTECTOMY  2011   CYSTOSCOPY W/ URETERAL STENT PLACEMENT Left 05/16/2014   Procedure: CYSTOSCOPY WITH RETROGRADE PYELOGRAM/URETERAL STENT PLACEMENT;  Surgeon: Morene LELON Salines, MD;  Location: [facility];  Service: Urology;  Laterality: Left;   CYSTOSCOPY WITH URETEROSCOPY AND STENT PLACEMENT Left 05/31/2014   Procedure: LEFT URETEROSCOPY AND STENT EXCHANGE, LEFT RETROGRADE;  Surgeon: Morene LELON Salines, MD;  Location: [facility];  Service: Urology;  Laterality: Left;   EYE SURGERY  75,85   repair lazy eye   HIATAL HERNIA REPAIR  10/07/2011   Procedure: LAPAROSCOPIC REPAIR OF HIATAL HERNIA;  Surgeon: Cough KATHEE Lunger, MD;  Location: [facility];  Service: General;  Laterality: N/A;   LAPAROSCOPIC GASTRIC BANDING  10/07/2011   Procedure: LAPAROSCOPIC GASTRIC BANDING;  Surgeon: Cough KATHEE Lunger, MD;  Location: [facility];  Service: General;  Laterality: N/A;   VASCULAR SURGERY     left leg- s/p  injections of veins     FAMILY HISTORY:  Family History  Problem Relation Age of Onset   COPD Mother    Hypertension Sister    Breast cancer Neg Hx      SOCIAL HISTORY:  reports that she has never smoked.  She has never used smokeless tobacco. She reports current alcohol use. She reports that she does not use drugs. The patient is married and lives in [place]. She works for the [employer]. She is accompanied by her husband and partner. She has adult children.    ALLERGIES: Carbon dioxide   MEDICATIONS:  Current Outpatient Medications  Medication Sig Dispense Refill   acetaminophen -codeine  (TYLENOL  #3) 300-30 MG per tablet Take 1 tablet by mouth every 4 (four) hours as needed. 20 tablet 0   aspirin 81 MG tablet Take 81 mg by mouth every morning.      cetirizine (ZYRTEC) 10 MG tablet Take 10 mg by mouth daily.      ciprofloxacin  (CIPRO ) 500 MG tablet Take 1 tablet (500 mg  total) by mouth once. 1 tablet 0   Cyanocobalamin (VITAMIN B-12) 500 MCG SUBL Place 1 tablet under the tongue every morning.     FLAXSEED, LINSEED, PO Take 1 tablet by mouth daily.      gentamicin  (GARAMYCIN ) 0.3 % ophthalmic solution Place 2 drops into the left eye every 4 (four) hours. For 5 days 5 mL 0   magnesium oxide (MAG-OX) 400 MG tablet Take 400 mg by mouth daily.     multivitamin-iron-minerals-folic acid (CENTRUM) chewable tablet Chew 1 tablet by mouth every morning.      phenazopyridine  (PYRIDIUM ) 200 MG tablet Take 1 tablet (200 mg total) by mouth 3 (three) times daily as needed for pain. (Patient not taking: Reported on 05/23/2014) 10 tablet 0   tamsulosin  (FLOMAX ) 0.4 MG CAPS capsule Take 1 capsule (0.4 mg total) by mouth daily. 7 capsule 0   trimethoprim -polymyxin b  (POLYTRIM ) ophthalmic solution Place 1 drop into the left eye every 6 (six) hours. For 5 days 10 mL 0   Trospium  Chloride 60 MG CP24 Take 1 capsule (60 mg total) by mouth daily. 7 each 0   No current facility-administered medications for this visit.     REVIEW OF SYSTEMS: On review of systems, the patient reports that she is doing well overall. She is doing well since her biopsy. No breast specific complaints are verbalized.      PHYSICAL EXAM:  Wt Readings from Last 3 Encounters:  03/12/22 280 lb (127 kg)  11/26/15 264 lb 6.4 oz (119.9 kg)  01/22/15 253 lb (114.8 kg)   Temp Readings from Last 3 Encounters:  03/12/22 97.6 F (36.4 C) (Oral)  11/26/15 98.7 F (37.1 C) (Oral)  01/22/15 98.1 F (36.7 C) (Oral)   BP Readings from Last 3 Encounters:  03/12/22 132/83  11/26/15 126/83  01/22/15 124/87   Pulse Readings from Last 3 Encounters:  03/12/22 67  11/26/15 77  01/22/15 82    In general this is a well appearing caucasian female in no acute distress. She's alert and oriented x4 and appropriate throughout the examination. Cardiopulmonary assessment is negative for acute distress and she exhibits  normal effort. Bilateral breast exam is deferred.    ECOG = 0  0 - Asymptomatic (Fully active, able to carry on all predisease activities without restriction)  1 - Symptomatic but completely ambulatory (Restricted in physically strenuous activity but ambulatory and able to carry out work of a light or sedentary nature. For example, light housework, office work)  2 - Symptomatic, <50% in bed during the day (Ambulatory and capable of all self care but unable to carry out any work activities. Up and about more than 50% of waking hours)  3 - Symptomatic, >50% in bed,  but not bedbound (Capable of only limited self-care, confined to bed or chair 50% or more of waking hours)  4 - Bedbound (Completely disabled. Cannot carry on any self-care. Totally confined to bed or chair)  5 - Death   Raylene MM, Creech RH, Tormey DC, et al. [phone]). Toxicity and response criteria of the [facility]. Am. DOROTHA Bridges. Oncol. 5 (6): 649-55    LABORATORY DATA:  Lab Results  Component Value Date   WBC 6.9 03/12/2022   HGB 13.7 03/12/2022   HCT 41.6 03/12/2022   MCV 85.4 03/12/2022   PLT 311 03/12/2022   Lab Results  Component Value Date   NA 138 03/12/2022   K 3.6 03/12/2022   CL 102 03/12/2022   CO2 24 03/12/2022   Lab Results  Component Value Date   ALT 26 03/12/2022   AST 32 03/12/2022   ALKPHOS 69 03/12/2022   BILITOT 0.4 03/12/2022      RADIOGRAPHY: US  RT BREAST BX W LOC DEV 1ST LESION IMG BX SPEC US  GUIDE Addendum Date: 03/13/2023 ADDENDUM REPORT: 03/13/2023 14:03 ADDENDUM: Pathology revealed GRADE I-II INVASIVE DUCTAL CARCINOMA of the RIGHT breast, 1 o'clock, 3cmfn, (ribbon clip). This was found to be concordant by Dr. Alm Parkins. Pathology results were discussed with the patient by telephone. The patient reported doing well after the biopsy with tenderness and itching at the site. Post biopsy instructions and care were reviewed and questions were answered. The patient  was encouraged to call [facility] for any additional concerns. My direct phone number was provided. The patient was referred to [facility] on March 18, 2023. Pathology results reported by Hendricks Benders, RN on 03/13/2023. Electronically Signed   By: Alm Parkins M.D.   On: 03/13/2023 14:03   Result Date: 03/13/2023 CLINICAL DATA:  Patient presents for ultrasound-guided core needle biopsy of a right breast mass. EXAM: ULTRASOUND GUIDED RIGHT BREAST CORE NEEDLE BIOPSY COMPARISON:  Previous exam(s). PROCEDURE: I met with the patient and we discussed the procedure of ultrasound-guided biopsy, including benefits and alternatives. We discussed the high likelihood of a successful procedure. We discussed the risks of the procedure, including infection, bleeding, tissue injury, clip migration, and inadequate sampling. Informed written consent was given. The usual time-out protocol was performed immediately prior to the procedure. Lesion quadrant: Upper inner quadrant Using sterile technique and 1% Lidocaine  as local anesthetic, under direct ultrasound visualization, a 12 gauge spring-loaded device was used to perform biopsy of the 6 mm mass at 1 o'clock using a inferomedial approach. At the conclusion of the procedure a ribbon shaped tissue marker clip was deployed into the biopsy cavity. Follow up 2 view mammogram was performed and dictated separately. IMPRESSION: Ultrasound guided biopsy of a right breast mass. No apparent complications. Electronically Signed: By: Alm Parkins M.D. On: 03/12/2023 10:58   MM CLIP PLACEMENT RIGHT Result Date: 03/12/2023 CLINICAL DATA:  Assessed post biopsy marker clip placement following ultrasound-guided core needle biopsy a right breast mass. EXAM: 3D DIAGNOSTIC RIGHT MAMMOGRAM POST ULTRASOUND BIOPSY COMPARISON:  Previous exam(s). FINDINGS: 3D Mammographic images were obtained  following ultrasound guided biopsy of a right breast mass. The biopsy marking clip is in expected position at the site of biopsy. IMPRESSION: Appropriate positioning of the ribbon shaped biopsy marking clip at the site of biopsy adjacent to the anterior margin of right breast mass. Final Assessment: Post Procedure Mammograms for Marker Placement Electronically  Signed   By: Alm Parkins M.D.   On: 03/12/2023 11:11   MM 3D DIAGNOSTIC MAMMOGRAM UNILATERAL RIGHT BREAST Result Date: 03/10/2023 CLINICAL DATA:  54 year old female presenting as a recall from screening for possible right breast mass. EXAM: DIGITAL DIAGNOSTIC UNILATERAL RIGHT MAMMOGRAM WITH TOMOSYNTHESIS AND CAD; ULTRASOUND RIGHT BREAST LIMITED TECHNIQUE: Right digital diagnostic mammography and breast tomosynthesis was performed. The images were evaluated with computer-aided detection. ; Targeted ultrasound examination of the right breast was performed COMPARISON:  Previous exam(s). ACR Breast Density Category b: There are scattered areas of fibroglandular density. FINDINGS: Mammogram: Right breast: Spot compression tomosynthesis views and full field CC and true lateral tomosynthesis views were performed demonstrating persistence of a small mass in the upper slightly inner right breast measuring approximately 0.4 cm. Ultrasound: Targeted ultrasound performed in the right breast at 12:00 to 1:00. There are tiny benign cysts at 12 o'clock 4-5 cm from the nipple. There is a small hypoechoic mass with indistinct margins at 1 o'clock 3 cm from the nipple measuring 0.6 x 0.4 x 0.6 cm which likely corresponds to the mammographic finding. Targeted ultrasound of the right axilla demonstrates normal lymph nodes. IMPRESSION: Indeterminate mass in the right breast at 1 o'clock measuring 0.6 cm. RECOMMENDATION: Ultrasound-guided core needle biopsy x1 of the right breast. Recommend close attention on post procedure mammogram to ensure correlation between the  mammographic and sonographic findings. I have discussed the findings and recommendations with the patient. If applicable, a reminder letter will be sent to the patient regarding the next appointment. BI-RADS CATEGORY  4: Suspicious. Electronically Signed   By: Inocente Ast M.D.   On: 03/10/2023 11:49   US  LIMITED ULTRASOUND INCLUDING AXILLA RIGHT BREAST Result Date: 03/10/2023 CLINICAL DATA:  53 year old female presenting as a recall from screening for possible right breast mass. EXAM: DIGITAL DIAGNOSTIC UNILATERAL RIGHT MAMMOGRAM WITH TOMOSYNTHESIS AND CAD; ULTRASOUND RIGHT BREAST LIMITED TECHNIQUE: Right digital diagnostic mammography and breast tomosynthesis was performed. The images were evaluated with computer-aided detection. ; Targeted ultrasound examination of the right breast was performed COMPARISON:  Previous exam(s). ACR Breast Density Category b: There are scattered areas of fibroglandular density. FINDINGS: Mammogram: Right breast: Spot compression tomosynthesis views and full field CC and true lateral tomosynthesis views were performed demonstrating persistence of a small mass in the upper slightly inner right breast measuring approximately 0.4 cm. Ultrasound: Targeted ultrasound performed in the right breast at 12:00 to 1:00. There are tiny benign cysts at 12 o'clock 4-5 cm from the nipple. There is a small hypoechoic mass with indistinct margins at 1 o'clock 3 cm from the nipple measuring 0.6 x 0.4 x 0.6 cm which likely corresponds to the mammographic finding. Targeted ultrasound of the right axilla demonstrates normal lymph nodes. IMPRESSION: Indeterminate mass in the right breast at 1 o'clock measuring 0.6 cm. RECOMMENDATION: Ultrasound-guided core needle biopsy x1 of the right breast. Recommend close attention on post procedure mammogram to ensure correlation between the mammographic and sonographic findings. I have discussed the findings and recommendations with the patient. If  applicable, a reminder letter will be sent to the patient regarding the next appointment. BI-RADS CATEGORY  4: Suspicious. Electronically Signed   By: Inocente Ast M.D.   On: 03/10/2023 11:49       IMPRESSION/PLAN: 1. Stage IA, [id number], grade 2, ER/PR positive invasive ductal carcinoma of the right breast. Dr. Dewey discusses the pathology findings and reviews the nature of early stage breast disease. The consensus from the breast conference includes breast  conservation with lumpectomy with  sentinel node biopsy. Depending on the size of the final tumor measurements rendered by pathology, the tumor may be tested for Oncotype Dx score to determine a role for systemic therapy. Provided that chemotherapy is not indicated, the patient's course would then be followed by external radiotherapy to the breast  to reduce risks of local recurrence. Dr. Odean anticipates adjuvant antiestrogen therapy to follow. We discussed the risks, benefits, short, and long term effects of radiotherapy, as well as the curative intent, and the patient is interested in proceeding. Dr. Dewey discusses the delivery and logistics of radiotherapy and anticipates a course of 4 weeks of radiotherapy to the right breast. We will see her back a few weeks after surgery to discuss the simulation process and anticipate we starting radiotherapy about 4-6 weeks after surgery.  2. Possible genetic predisposition to malignancy. The patient is a candidate for genetic testing given her personal and family history. She will meet with our geneticist today in clinic.   In a visit lasting 60 minutes, greater than 50% of the time was spent face to face reviewing her case, as well as in preparation of, discussing, and coordinating the patient's care.  The above documentation reflects my direct findings during this shared patient visit. Please see the separate note by Dr. Dewey on this date for the remainder of the patient's plan of  care.    Donald KYM Husband, [facility]    **Disclaimer: This note was dictated with voice recognition software. Similar sounding words can inadvertently be transcribed and this note may contain transcription errors which may not have been corrected upon publication of note.**

## 2023-03-18 NOTE — Assessment & Plan Note (Signed)
 03/12/2023:Screening mammogram detected right breast asymmetry at 1 o'clock position measuring 0.6 cm.  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 After reviewing the pathology in detail, we proceeded to discuss the different treatment options between surgery, radiation, chemotherapy, antiestrogen therapies.  Recommendations: 1. Breast conserving surgery followed by 2. Oncotype DX testing to determine if chemotherapy would be of any benefit followed by 3. Adjuvant radiation therapy followed by 4. Adjuvant antiestrogen therapy  Oncotype counseling: I discussed Oncotype DX test. I explained to the patient that this is a 21 gene panel to evaluate patient tumors DNA to calculate recurrence score. This would help determine whether patient has high risk or low risk breast cancer. She understands that if her tumor was found to be high risk, she would benefit from systemic chemotherapy. If low risk, no need of chemotherapy.  Return to clinic after surgery to discuss final pathology report and then determine if Oncotype DX testing will need to be sent.

## 2023-03-18 NOTE — Progress Notes (Signed)
 [facility] CONSULT NOTE  Patient Care Team: Dottie Norleen PHEBE PONCE, MD as PCP - General (Unknown Physician Specialty) Himmelrich, Camie RAMAN, RD (Inactive) as Dietitian Tyree Nanetta SAILOR, RN as Oncology Nurse Navigator Glean Stephane BROCKS, RN as Oncology Nurse Navigator Ebbie Cough, MD as Consulting Physician (General Surgery) Odean Potts, MD as Consulting Physician (Hematology and Oncology) Dewey Norleen, MD as Consulting Physician (Radiation Oncology)  CHIEF COMPLAINTS/PURPOSE OF CONSULTATION:  Newly diagnosed breast cancer  HISTORY OF PRESENTING ILLNESS:  Darlene Patterson is a 53 year old with screening mammogram detected right breast asymmetry at 1 o'clock position measured 0.6 cm.  Biopsy of which came back as grade 1-2 IDC ER 99% PR 99%, HER2 negative, Ki67 5%.  She is presented this morning to the multidisciplinary tumor board and she is here today accompanied by family to discuss her treatment plan. I reviewed her records extensively and collaborated the history with the patient.  SUMMARY OF ONCOLOGIC HISTORY: Oncology History  Malignant neoplasm of upper-inner quadrant of right breast in female, estrogen receptor positive (HCC)  03/12/2023 Initial Diagnosis   Screening mammogram detected right breast asymmetry at 1 o'clock position measuring 0.6 cm.  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, Ki-67 5%   03/18/2023 Cancer Staging   Staging form: Breast, AJCC 8th Edition - Clinical stage from 03/18/2023: Stage IA (cT1b, cN0, cM0, G2, ER+, PR+, HER2-) - Signed by Lanell Donald Stagger, PA-C on 03/18/2023 Stage prefix: Initial diagnosis Method of lymph node assessment: Clinical Histologic grading system: 3 grade system      MEDICAL HISTORY:  Past Medical History:  Diagnosis Date   Anxiety    with anesthesia   Complication of anesthesia    Great toe pain    cyst left great toe- to be excised 05-24-14- [place]   H/O  hiatal hernia    per upper GI 3/13   Heart murmur    early teens- resolved   Kidney stone    presently has stent-surgery planned   Obesity    Peripheral vascular disease (HCC)    varicose veins- left   PONV (postoperative nausea and vomiting)    'SEVERE PER PT   Status post gastric banding     SURGICAL HISTORY: Past Surgical History:  Procedure Laterality Date   ABDOMINAL HYSTERECTOMY  3/13   ACHILLES TENDON REPAIR  08, 09   bilateral   BREAST BIOPSY Right 03/12/2023   US  RT BREAST BX W LOC DEV 1ST LESION IMG BX SPEC US  GUIDE 03/12/2023 [facility] MAMMOGRAPHY   BREATH TEK H PYLORI  05/21/2011   Procedure: BREATH TEK H PYLORI;  Surgeon: Cough KATHEE Lunger, MD;  Location: [facility];  Service: General;  Laterality: N/A;   CESAREAN SECTION  89, 93   CHOLECYSTECTOMY  2011   CYSTOSCOPY W/ URETERAL STENT PLACEMENT Left 05/16/2014   Procedure: CYSTOSCOPY WITH RETROGRADE PYELOGRAM/URETERAL STENT PLACEMENT;  Surgeon: Morene LELON Salines, MD;  Location: [facility];  Service: Urology;  Laterality: Left;   CYSTOSCOPY WITH URETEROSCOPY AND STENT PLACEMENT Left 05/31/2014   Procedure: LEFT URETEROSCOPY AND STENT EXCHANGE, LEFT RETROGRADE;  Surgeon: Morene LELON Salines, MD;  Location: [facility];  Service: Urology;  Laterality: Left;   EYE SURGERY  75,85   repair lazy eye   HIATAL HERNIA REPAIR  10/07/2011   Procedure: LAPAROSCOPIC REPAIR OF HIATAL HERNIA;  Surgeon: Cough KATHEE Lunger, MD;  Location: [facility];  Service: General;  Laterality: N/A;   LAPAROSCOPIC GASTRIC BANDING  10/07/2011   Procedure: LAPAROSCOPIC GASTRIC BANDING;  Surgeon: Donnice KATHEE Lunger, MD;  Location: [facility];  Service: General;  Laterality: N/A;   VASCULAR SURGERY     left leg- s/p  injections of veins    SOCIAL HISTORY: Social History   Socioeconomic History   Marital status: Married    Spouse name: Not on file   Number of children: Not on file   Years of education: Not on file   Highest education level: Not on file  Occupational  History   Not on file  Tobacco Use   Smoking status: Never   Smokeless tobacco: Never  Substance and Sexual Activity   Alcohol use: Yes    Comment: 2-3 beers on 2 weekends/month   Drug use: No   Sexual activity: Not on file  Other Topics Concern   Not on file  Social History Narrative   Not on file   Social Drivers of Health   Financial Resource Strain: Not on file  Food Insecurity: Not on file  Transportation Needs: Not on file  Physical Activity: Not on file  Stress: Not on file  Social Connections: Unknown (07/23/2021)   Received from [facility], [facility]   Social Network    Social Network: Not on file  Intimate Partner Violence: Unknown (06/14/2021)   [facility], [facility]   HITS    Physically Hurt: Not on file    Insult or Talk Down To: Not on file    Threaten Physical Harm: Not on file    Scream or Curse: Not on file    FAMILY HISTORY: Family History  Problem Relation Age of Onset   COPD Mother    Hypertension Sister    Breast cancer Neg Hx     ALLERGIES:  is allergic to carbon dioxide.  MEDICATIONS:  Current Outpatient Medications  Medication Sig Dispense Refill   Calcium Carb-Cholecalciferol (CALCIUM CARBONATE+VITAMIN D PO) Take 2 tablets by mouth daily. Calcium 500 mg with 25 mcg Vitamin D3     cetirizine (ZYRTEC) 10 MG tablet Take 10 mg by mouth daily.     Cyanocobalamin (VITAMIN B-12) 500 MCG SUBL Place 1 tablet under the tongue every morning.     magnesium oxide (MAG-OX) 400 MG tablet Take 400 mg by mouth daily.     OVER THE COUNTER MEDICATION Take 3 tablets by mouth daily. Fiber Gummies     OVER THE COUNTER MEDICATION Take 1.5 mLs by mouth daily. Soursop     venlafaxine XR (EFFEXOR-XR) 75 MG 24 hr capsule Take 75 mg by mouth daily.     ZEPBOUND 2.5 MG/0.5ML Pen SMARTSIG:1 unspecified SUB-Q Once a Week     No current facility-administered medications for this visit.    REVIEW OF SYSTEMS:   Constitutional: Denies  fevers, chills or abnormal night sweats Breast:  Denies any palpable lumps or discharge All other systems were reviewed with the patient and are negative.  PHYSICAL EXAMINATION: ECOG PERFORMANCE STATUS: 1 - Symptomatic but completely ambulatory  Vitals:   03/18/23 1223  BP: (!) 150/96  Pulse: (!) 107  Resp: 18  Temp: 98.2 F (36.8 C)  SpO2: 94%   Filed Weights   03/18/23 1223  Weight: 271 lb 6.4 oz (123.1 kg)    GENERAL:alert, no distress and comfortable BREAST: No palpable nodules in breast. No palpable axillary or supraclavicular lymphadenopathy (exam performed in the presence of a chaperone)   LABORATORY DATA:  I have reviewed the data as listed Lab  Results  Component Value Date   WBC 10.0 03/18/2023   HGB 14.4 03/18/2023   HCT 42.6 03/18/2023   MCV 84.0 03/18/2023   PLT 341 03/18/2023   Lab Results  Component Value Date   NA 138 03/18/2023   K 4.0 03/18/2023   CL 106 03/18/2023   CO2 24 03/18/2023    RADIOGRAPHIC STUDIES: I have personally reviewed the radiological reports and agreed with the findings in the report.  ASSESSMENT AND PLAN:  Malignant neoplasm of upper-inner quadrant of right breast in female, estrogen receptor positive (HCC) 03/12/2023:Screening mammogram detected right breast asymmetry at 1 o'clock position measuring 0.6 cm.  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 After reviewing the pathology in detail, we proceeded to discuss the different treatment options between surgery, radiation, chemotherapy, antiestrogen therapies.  Recommendations: 1. Breast conserving surgery followed by 2. Oncotype DX testing to determine if chemotherapy would be  of any benefit followed by 3. Adjuvant radiation therapy followed by 4. Adjuvant antiestrogen therapy  Oncotype counseling: I discussed Oncotype DX test. I explained to the patient that this is a 21 gene panel to evaluate patient tumors DNA to calculate recurrence score. This would help determine whether patient has high risk or low risk breast cancer. She understands that if her tumor was found to be high risk, she would benefit from systemic chemotherapy. If low risk, no need of chemotherapy.  Return to clinic after surgery to discuss final pathology report and then determine if Oncotype DX testing will need to be sent. Patient works for [employer].   All questions were answered. The patient knows to call the clinic with any problems, questions or concerns.    Viinay K Somya Jauregui, MD 03/18/23

## 2023-03-18 NOTE — Therapy (Signed)
 OUTPATIENT PHYSICAL THERAPY BREAST CANCER BASELINE EVALUATION   Patient Name: Darlene Patterson MRN: [id number] DOB:[birth year], 53 y.o., female Today's Date: 03/18/2023  END OF SESSION:  PT End of Session - 03/18/23 1326     Visit Number 1    Number of Visits 2    Date for PT Re-Evaluation 05/13/23    PT Start Time 1327    PT Stop Time 1338   Also saw pt from 1554 to 1630 for a total of 47 minutes   PT Time Calculation (min) 11 min    Activity Tolerance Patient tolerated treatment well    Behavior During Therapy [facility] for tasks assessed/performed             Past Medical History:  Diagnosis Date   Anxiety    with anesthesia   Complication of anesthesia    Great toe pain    cyst left great toe- to be excised 3-16-16[place] [facility]   H/O hiatal hernia    per upper GI 3/13   Heart murmur    early teens- resolved   Kidney stone    presently has stent-surgery planned   Obesity    Peripheral vascular disease (HCC)    varicose veins- left   PONV (postoperative nausea and vomiting)    'SEVERE PER PT   Status post gastric banding    Past Surgical History:  Procedure Laterality Date   ABDOMINAL HYSTERECTOMY  3/13   ACHILLES TENDON REPAIR  08, 09   bilateral   BREAST BIOPSY Right 03/12/2023   US  RT BREAST BX W LOC DEV 1ST LESION IMG BX SPEC US  GUIDE 03/12/2023 [facility] MAMMOGRAPHY   BREATH TEK H PYLORI  05/21/2011   Procedure: BREATH TEK H PYLORI;  Surgeon: Donnice KATHEE Lunger, MD;  Location: [facility];  Service: General;  Laterality: N/A;   CESAREAN SECTION  89, 93   CHOLECYSTECTOMY  2011   CYSTOSCOPY W/ URETERAL STENT PLACEMENT Left 05/16/2014   Procedure: CYSTOSCOPY WITH RETROGRADE PYELOGRAM/URETERAL STENT PLACEMENT;  Surgeon: Morene LELON Salines, MD;  [facility];  Service: Urology;  Laterality: Left;   CYSTOSCOPY WITH URETEROSCOPY AND STENT PLACEMENT Left 05/31/2014   Procedure: LEFT URETEROSCOPY AND STENT EXCHANGE, LEFT RETROGRADE;  Surgeon: Morene LELON Salines, MD;  Location:  [facility];  Service: Urology;  Laterality: Left;   EYE SURGERY  75,85   repair lazy eye   HIATAL HERNIA REPAIR  10/07/2011   Procedure: LAPAROSCOPIC REPAIR OF HIATAL HERNIA;  Surgeon: Donnice KATHEE Lunger, MD;  [facility];  Service: General;  Laterality: N/A;   LAPAROSCOPIC GASTRIC BANDING  10/07/2011   Procedure: LAPAROSCOPIC GASTRIC BANDING;  Surgeon: Donnice KATHEE Lunger, MD;  [facility];  Service: General;  Laterality: N/A;   VASCULAR SURGERY     left leg- s/p  injections of veins   Patient Active Problem List   Diagnosis Date Noted   Malignant neoplasm of upper-inner quadrant of right breast in female, estrogen receptor positive (HCC) 03/17/2023   Lapband APS + [facility] repair July 2013 10/08/2011   Obesity BMI 41 05/01/2011   REFERRING PROVIDER: Dr. Donnice Bury  REFERRING DIAG: Right breast cancer  THERAPY DIAG:  Malignant neoplasm of upper-inner quadrant of right breast in female, estrogen receptor positive (HCC)  Abnormal posture  Rationale for Evaluation and Treatment: Rehabilitation  ONSET DATE: 02/13/2023  SUBJECTIVE:  SUBJECTIVE STATEMENT: Patient reports she is here today to be seen by her medical team for her newly diagnosed right breast cancer.   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 It measures 6 mm and is located in the upper inner quadrant. It is ER/PR positive and HER2 negative with a Ki67 of 5%.   PATIENT GOALS:   reduce lymphedema risk and learn post op HEP.   PAIN:  Are you having pain? No  PRECAUTIONS: Active CA   RED FLAGS: None   HAND DOMINANCE: right  WEIGHT BEARING RESTRICTIONS: No  FALLS:  Has patient fallen in last 6 months? No  LIVING ENVIRONMENT: Patient lives with: her husband and best  friend [name] Lives in: House/apartment Has following equipment at home: None  OCCUPATION: She works full time at [employer] as a Engineer, Petroleum: She walks 3x/week for 10-15 min each time and rides a stationary bike 3-4x/week for about an hour  PRIOR LEVEL OF FUNCTION: Independent   OBJECTIVE: Note: Objective measures were completed at Evaluation unless otherwise noted.  COGNITION: Overall cognitive status: Within functional limits for tasks assessed    POSTURE:  Forward head and rounded shoulders posture  UPPER EXTREMITY AROM/PROM:  A/PROM RIGHT   eval   Shoulder extension 45  Shoulder flexion 161  Shoulder abduction 166  Shoulder internal rotation 76  Shoulder external rotation 88    (Blank rows = not tested)  A/PROM LEFT   eval  Shoulder extension 48  Shoulder flexion 152  Shoulder abduction 168  Shoulder internal rotation 79  Shoulder external rotation 88    (Blank rows = not tested)  CERVICAL AROM: All within normal limits  UPPER EXTREMITY STRENGTH: WNL  LYMPHEDEMA ASSESSMENTS (in cm):   LANDMARK RIGHT   eval  10 cm proximal to olecranon process 36.6  Olecranon process 29.7  10 cm proximal to ulnar styloid process 25.1  Just proximal to ulnar styloid process 18.6  Across hand at thumb web space 20.3  At base of 2nd digit 7.1  (Blank rows = not tested)  LANDMARK LEFT   eval  10 cm proximal to olecranon process 37  Olecranon process 29  10 cm proximal to ulnar styloid process 25.5  Just proximal to ulnar styloid process 19.2  Across hand at thumb web space 20.6  At base of 2nd digit 7.4  (Blank rows = not tested)  L-DEX LYMPHEDEMA SCREENING:  The patient was assessed using the L-Dex machine today to produce a lymphedema index baseline score. The patient will be reassessed on a regular basis (typically every 3 months) to obtain new L-Dex scores. If the score is > 6.5 points away from his/her baseline score indicating onset of  subclinical lymphedema, it will be recommended to wear a compression garment for 4 weeks, 12 hours per day and then be reassessed. If the score continues to be > 6.5 points from baseline at reassessment, we will initiate lymphedema treatment. Assessing in this manner has a 95% rate of preventing clinically significant lymphedema.   L-DEX FLOWSHEETS - 03/18/23 1400       L-DEX LYMPHEDEMA SCREENING   Measurement Type Unilateral    L-DEX MEASUREMENT EXTREMITY Upper Extremity    POSITION  Standing    DOMINANT SIDE Right    At Risk Side Right    BASELINE SCORE (UNILATERAL) -1.5             QUICK DASH SURVEY:  Junie Palin -  03/18/23 0001     Open a tight or new jar Mild difficulty    Do heavy household chores (wash walls, wash floors) No difficulty    Carry a shopping bag or briefcase No difficulty    Wash your back No difficulty    Use a knife to cut food No difficulty    Recreational activities in which you take some force or impact through your arm, shoulder, or hand (golf, hammering, tennis) Mild difficulty    During the past week, to what extent has your arm, shoulder or hand problem interfered with your normal social activities with family, friends, neighbors, or groups? Not at all    During the past week, to what extent has your arm, shoulder or hand problem limited your work or other regular daily activities Not at all    Arm, shoulder, or hand pain. None    Tingling (pins and needles) in your arm, shoulder, or hand None    Difficulty Sleeping No difficulty    DASH Score 4.55 %              PATIENT EDUCATION:  Education details: Lymphedema risk reduction and post op shoulder/posture HEP Person educated: Patient Education method: Explanation, Demonstration, Handout Education comprehension: Patient verbalized understanding and returned demonstration  HOME EXERCISE PROGRAM: Patient was instructed today in a home exercise program today for post op shoulder range of  motion. These included active assist shoulder flexion in sitting, scapular retraction, wall walking with shoulder abduction, and hands behind head external rotation.  She was encouraged to do these twice a day, holding 3 seconds and repeating 5 times when permitted by her physician.   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 It measures 6 mm and is located in the upper inner quadrant. It is ER/PR positive and HER2 negative with a Ki67 of 5%. Her multidisciplinary medical team met prior to her assessments to determine a recommended treatment plan. She is planning to have a right lumpectomy and sentinel node biopsy followed by Oncotype testing, radiation, and anti-estrogen therapy. She will benefit from a post op PT reassessment to determine needs and from L-Dex screens every 3 months for 2 years to detect subclinical lymphedema.  Pt will benefit from skilled therapeutic intervention to improve on the following deficits: Decreased knowledge of precautions, impaired UE functional use, pain, decreased ROM, postural dysfunction.   PT treatment/interventions: ADL/self-care home management, pt/family education, therapeutic exercise  REHAB POTENTIAL: Excellent  CLINICAL DECISION MAKING: Stable/uncomplicated  EVALUATION COMPLEXITY: Low   GOALS: Goals reviewed with patient? YES  LONG TERM GOALS: (STG=LTG)    Name Target Date Goal status  1 Pt will be able to verbalize understanding of pertinent lymphedema risk reduction practices relevant to her dx specifically related to skin care.  Baseline:  No knowledge 03/18/2023 Achieved at eval  2 Pt will be able to return demo and/or verbalize understanding of the post op HEP related to regaining shoulder ROM. Baseline:  No knowledge 03/18/2023 Achieved at eval  3 Pt will be able to verbalize understanding of the importance of attending the post op After Breast CA Class for further  lymphedema risk reduction education and therapeutic exercise.  Baseline:  No knowledge 03/18/2023 Achieved at eval  4 Pt will demo she has regained full shoulder ROM and function post operatively compared to baselines.  Baseline: See objective measurements taken today. 05/13/2023     PLAN:  PT FREQUENCY/DURATION: EVAL and 1  follow up appointment.   PLAN FOR NEXT SESSION: will reassess 3-4 weeks post op to determine needs.   Patient will follow up at outpatient cancer rehab 3-4 weeks following surgery.  If the patient requires physical therapy at that time, a specific plan will be dictated and sent to the referring physician for approval. The patient was educated today on appropriate basic range of motion exercises to begin post operatively and the importance of attending the After Breast Cancer class following surgery.  Patient was educated today on lymphedema risk reduction practices as it pertains to recommendations that will benefit the patient immediately following surgery.  She verbalized good understanding.    Physical Therapy Information for After Breast Cancer Surgery/Treatment:  Lymphedema is a swelling condition that you may be at risk for in your arm if you have lymph nodes removed from the armpit area.  After a sentinel node biopsy, the risk is approximately 5-9% and is higher after an axillary node dissection.  There is treatment available for this condition and it is not life-threatening.  Contact your physician or physical therapist with concerns. You may begin the 4 shoulder/posture exercises (see additional sheet) when permitted by your physician (typically a week after surgery).  If you have drains, you may need to wait until those are removed before beginning range of motion exercises.  A general recommendation is to not lift your arms above shoulder height until drains are removed.  These exercises should be done to your tolerance and gently.  This is not a no pain/no gain type of  recovery so listen to your body and stretch into the range of motion that you can tolerate, stopping if you have pain.  If you are having immediate reconstruction, ask your plastic surgeon about doing exercises as he or she may want you to wait. We encourage you to attend the free one time ABC (After Breast Cancer) class offered by [facility].  You will learn information related to lymphedema risk, prevention and treatment and additional exercises to regain mobility following surgery.  You can call [phone] for more information.  This is offered the 1st and 3rd Monday of each month.  You only attend the class one time. While undergoing any medical procedure or treatment, try to avoid blood pressure being taken or needle sticks from occurring on the arm on the side of cancer.   This recommendation begins after surgery and continues for the rest of your life.  This may help reduce your risk of getting lymphedema (swelling in your arm). An excellent resource for those seeking information on lymphedema is the National Lymphedema Network's web site. It can be accessed at www.lymphnet.org If you notice swelling in your hand, arm or breast at any time following surgery (even if it is many years from now), please contact your doctor or physical therapist to discuss this.  Lymphedema can be treated at any time but it is easier for you if it is treated early on.  If you feel like your shoulder motion is not returning to normal in a reasonable amount of time, please contact your surgeon or physical therapist.  [facility] [id number]. [address], [place] [address]  ABC CLASS After Breast Cancer Class  After Breast Cancer Class is a specially designed exercise class to assist you in a safe recover after having breast cancer surgery.  In this class you will learn how to get back to full function  whether your drains were just removed or if  you had surgery a month ago.  This one-time class is held the 1st and 3rd Monday of every month from 11:00 a.m. until 12:00 noon virtually.  This class is FREE and space is limited. For more information or to register for the next available class, call [phone].  Class Goals  Understand specific stretches to improve the flexibility of you chest and shoulder. Learn ways to safely strengthen your upper body and improve your posture. Understand the warning signs of infection and why you may be at risk for an arm infection. Learn about Lymphedema and prevention.  ** You do not attend this class until after surgery.  Drains must be removed to participate  Patient was instructed today in a home exercise program today for post op shoulder range of motion. These included active assist shoulder flexion in sitting, scapular retraction, wall walking with shoulder abduction, and hands behind head external rotation.  She was encouraged to do these twice a day, holding 3 seconds and repeating 5 times when permitted by her physician.  Eward Wonda Sharps, [place] 03/18/23 4:50 PM

## 2023-03-19 NOTE — Progress Notes (Signed)
 REFERRING PROVIDER: Odean Potts, MD [address] [place],  [place] [address]  PRIMARY PROVIDER:  Dottie Norleen PHEBE PONCE, MD  PRIMARY REASON FOR VISIT:  1. Malignant neoplasm of upper-inner quadrant of right breast in female, estrogen receptor positive (HCC)     HISTORY OF PRESENT ILLNESS:   Ms. Boltz, a 52 y.o. female, was seen for a [place] cancer genetics consultation during the breast multidisciplinary clinic at the request of Dr. Odean due to a personal history of breast cancer.  Ms. Loisel presents to clinic today to discuss the possibility of a hereditary predisposition to cancer, to discuss genetic testing, and to further clarify her future cancer risks, as well as potential cancer risks for family members.   In January 2025, at the age of 1, Ms. Brodowski was diagnosed with invasive ductal carcinoma of the right breast (ER+/PR+/HER2-). The treatment plan is pending.   CANCER HISTORY:  Oncology History  Malignant neoplasm of upper-inner quadrant of right breast in female, estrogen receptor positive (HCC)  03/12/2023 Initial Diagnosis   Screening mammogram detected right breast asymmetry at 1 o'clock position measuring 0.6 cm.  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, Ki-67 5%   03/18/2023 Cancer Staging   Staging form: Breast, AJCC 8th Edition - Clinical stage from 03/18/2023: Stage IA (cT1b, cN0, cM0, G2, ER+, PR+, HER2-) - Signed by Lanell Donald Stagger, PA-C on 03/18/2023 Stage prefix: Initial diagnosis Method of lymph node assessment: Clinical Histologic grading system: 3 grade system      Past Medical History:  Diagnosis Date   Anxiety    with anesthesia   Complication of anesthesia    Great toe pain    cyst left great toe- to be excised 05-24-14- [place]   H/O hiatal hernia    per upper GI 3/13   Heart murmur    early teens- resolved   Kidney stone    presently has  stent-surgery planned   Obesity    Peripheral vascular disease (HCC)    varicose veins- left   PONV (postoperative nausea and vomiting)    'SEVERE PER PT   Status post gastric banding     Past Surgical History:  Procedure Laterality Date   ABDOMINAL HYSTERECTOMY  3/13   ACHILLES TENDON REPAIR  08, 09   bilateral   BREAST BIOPSY Right 03/12/2023   US  RT BREAST BX W LOC DEV 1ST LESION IMG BX SPEC US  GUIDE 03/12/2023 [facility] MAMMOGRAPHY   BREATH TEK H PYLORI  05/21/2011   Procedure: BREATH TEK H PYLORI;  Surgeon: Donnice KATHEE Lunger, MD;  Location: [facility];  Service: General;  Laterality: N/A;   CESAREAN SECTION  89, 93   CHOLECYSTECTOMY  2011   CYSTOSCOPY W/ URETERAL STENT PLACEMENT Left 05/16/2014   Procedure: CYSTOSCOPY WITH RETROGRADE PYELOGRAM/URETERAL STENT PLACEMENT;  Surgeon: Morene LELON Salines, MD;  [facility];  Service: Urology;  Laterality: Left;   CYSTOSCOPY WITH URETEROSCOPY AND STENT PLACEMENT Left 05/31/2014   Procedure: LEFT URETEROSCOPY AND STENT EXCHANGE, LEFT RETROGRADE;  Surgeon: Morene LELON Salines, MD;  [facility];  Service: Urology;  Laterality: Left;   EYE SURGERY  75,85   repair lazy eye   HIATAL HERNIA REPAIR  10/07/2011   Procedure: LAPAROSCOPIC REPAIR OF HIATAL HERNIA;  Surgeon: Donnice KATHEE Lunger, MD;  [facility];  Service: General;  Laterality: N/A;   LAPAROSCOPIC GASTRIC BANDING  10/07/2011   Procedure: LAPAROSCOPIC GASTRIC BANDING;  Surgeon:  Donnice KATHEE Lunger, MD;  [facility];  Service: General;  Laterality: N/A;   VASCULAR SURGERY     left leg- s/p  injections of veins     FAMILY HISTORY:  We obtained a detailed, 4-generation family history.  She did not report a known family history of cancer.      Ms. Pridgen is unaware of previous family history of genetic testing for hereditary cancer risks. There is no reported Ashkenazi Jewish ancestry. There is no known consanguinity.  GENETIC COUNSELING ASSESSMENT: Ms. Windish is a 53  y.o. female with a personal history of cancer which is somewhat suggestive of a hereditary cancer syndrome and predisposition to cancer given her age of diagnosis. We, therefore, discussed and recommended the following at today's visit.   DISCUSSION: We discussed that 5 - 10% of cancer is hereditary, with most cases of hereditary breast cancer associated with mutations in BRCA1/2.  There are other genes that can be associated with hereditary breast cancer syndromes.  Type of cancer risk and level of risk are gene-specific. We discussed that testing is beneficial for several reasons including knowing how to follow individuals after completing their treatment, identifying whether potential treatment/surgery plans options would be beneficial, and understanding if other family members could be at risk for cancer and allowing them to undergo genetic testing.   We reviewed the characteristics, features and inheritance patterns of hereditary cancer syndromes. We also discussed genetic testing, including the appropriate family members to test, the process of testing, insurance coverage and turn-around-time for results. We discussed the implications of a negative, positive and/or variant of uncertain significant result. In order to get genetic test results in a timely manner so that Ms. Bortle can use these genetic test results for surgical decisions, we recommended Ms. Kibbe pursue genetic testing for the [facility] BRCAPlus Panel.  The BRCAPlus gene panel offered by [facility] and includes sequencing and rearrangement analysis for the following 13 genes: ATM, BARD1, BRCA1, BRCA2, CDH1, CHEK2, NF1, PALB2, PTEN, RAD51C, RAD51D, STK11 and TP53. Once complete, we recommend Ms. Nuzzo pursue reflex genetic testing to a more comprehensive gene panel.   The [facility] CancerNext+RNAinsight Panel includes sequencing, rearrangement analysis, and RNA analysis for the following 39 genes: APC, ATM, BAP1, BARD1, BMPR1A,  BRCA1, BRCA2, BRIP1, CDH1, CDKN2A, CHEK2, FH, FLCN, MET, MLH1, MSH2, MSH6, MUTYH, NF1, NTHL1, PALB2, PMS2, PTEN, RAD51C, RAD51D, SMAD4, STK11, TP53, TSC1, TSC2, and VHL (sequencing and deletion/duplication); AXIN2, HOXB13, MBD4, MSH3, POLD1 and POLE (sequencing only); EPCAM and GREM1 (deletion/duplication only).  Based on Ms. Terrones's personal history of breast cancer, she meets the American Society of Breast Surgeon's criteria for genetic testing.  Per NCCN, genetic testing for those with a personal history of breast cancer before age 67 can be considered. She may still have an out of pocket cost. If her out of pocket cost for testing is over $100, the laboratory should contact them to discuss self-pay prices, patient pay assistance programs, if applicable, and other billing options.   PLAN: After considering the risks, benefits, and limitations, Ms. Shan provided informed consent to pursue genetic testing and the blood sample was sent to [facility] for analysis of the BRCAPlus and CancerNext+RNAinsight Panel. Results should be available within approximately 1-2 weeks' time, at which point they will be disclosed by telephone to Ms. Pease, as will any additional recommendations warranted by these results. Ms. Naron will receive a summary of her genetic counseling visit and a copy of her results once available. This  information will also be available in Epic.   Ms. Winterhalter questions were answered to her satisfaction today. Our contact information was provided should additional questions or concerns arise. Thank you for the referral and allowing us  to share in the care of your patient.   Joshuwa Vecchio M. Nydia, MS, [facility] Genetic Counselor [email or web address] (P) [phone]   25 minutes were spent on the date of the encounter in service to the patient including preparation, face-to-face consultation, documentation and care coordination.  The patient was accompanied by her  husband and best friend.  Dr. Gudena was available to discuss this case as needed.   _______________________________________________________________________ For Office Staff:  Number of people involved in session: 3 Was an Intern/ student involved with case: no

## 2023-03-24 ENCOUNTER — Encounter: Payer: Self-pay | Admitting: *Deleted

## 2023-03-24 ENCOUNTER — Telehealth: Payer: Self-pay | Admitting: *Deleted

## 2023-03-24 ENCOUNTER — Other Ambulatory Visit: Payer: Self-pay

## 2023-03-24 ENCOUNTER — Other Ambulatory Visit: Payer: Self-pay | Admitting: General Surgery

## 2023-03-24 ENCOUNTER — Encounter (HOSPITAL_BASED_OUTPATIENT_CLINIC_OR_DEPARTMENT_OTHER): Payer: Self-pay | Admitting: General Surgery

## 2023-03-24 DIAGNOSIS — C50211 Malignant neoplasm of upper-inner quadrant of right female breast: Secondary | ICD-10-CM

## 2023-03-24 NOTE — Telephone Encounter (Signed)
 Spoke with patient to follow up from [facility] and assess navigation needs.  Patient stated she wanted to purchase the purple partner care book for her husband and partner to read and was able to do this through the books's website. Patient denies any other questions or concerns at this time.  Encouraged her to call should anything arise.

## 2023-03-30 ENCOUNTER — Encounter: Payer: Self-pay | Admitting: Genetic Counselor

## 2023-03-30 ENCOUNTER — Telehealth: Payer: Self-pay | Admitting: Genetic Counselor

## 2023-03-30 ENCOUNTER — Ambulatory Visit: Payer: Self-pay | Admitting: Genetic Counselor

## 2023-03-30 DIAGNOSIS — Z1379 Encounter for other screening for genetic and chromosomal anomalies: Secondary | ICD-10-CM | POA: Insufficient documentation

## 2023-03-30 DIAGNOSIS — C50211 Malignant neoplasm of upper-inner quadrant of right female breast: Secondary | ICD-10-CM

## 2023-03-30 NOTE — Telephone Encounter (Signed)
Disclosed negative genetics.    

## 2023-03-31 ENCOUNTER — Ambulatory Visit
Admission: RE | Admit: 2023-03-31 | Discharge: 2023-03-31 | Disposition: A | Discharge status: Home / Self Care | Payer: Commercial Managed Care - PPO | Source: Ambulatory Visit | Attending: General Surgery | Admitting: General Surgery

## 2023-03-31 DIAGNOSIS — C50211 Malignant neoplasm of upper-inner quadrant of right female breast: Secondary | ICD-10-CM

## 2023-03-31 HISTORY — PX: BREAST BIOPSY: SHX20

## 2023-03-31 MED ORDER — CHLORHEXIDINE GLUCONATE CLOTH 2 % EX PADS: 6.0000 | MEDICATED_PAD | Freq: Once | CUTANEOUS | Status: DC

## 2023-03-31 MED ORDER — ENSURE PRE-SURGERY PO LIQD: 296.0000 mL | Freq: Once | ORAL | Status: DC

## 2023-03-31 NOTE — Anesthesia Preprocedure Evaluation (Signed)
Anesthesia Evaluation  Patient identified by MRN, date of birth, ID band Patient awake    Reviewed: Allergy & Precautions, H&P , NPO status , Patient's Chart, lab work & pertinent test results  History of Anesthesia Complications (+) PONV and history of anesthetic complications  Airway Mallampati: III  TM Distance: >3 FB Neck ROM: Full    Dental  (+) Teeth Intact, Dental Advisory Given   Pulmonary neg pulmonary ROS   Pulmonary exam normal breath sounds clear to auscultation       Cardiovascular + Peripheral Vascular Disease  Normal cardiovascular exam Rhythm:Regular Rate:Normal     Neuro/Psych  PSYCHIATRIC DISORDERS Anxiety     negative neurological ROS     GI/Hepatic Neg liver ROS, hiatal hernia,,,Hx of gastric band   Endo/Other    Class 3 obesityBMI 42  Renal/GU negative Renal ROS  negative genitourinary   Musculoskeletal negative musculoskeletal ROS (+)    Abdominal  (+) + obese  Peds negative pediatric ROS (+)  Hematology negative hematology ROS (+)   Anesthesia Other Findings Right sided breast ductal carcinoma  Reproductive/Obstetrics negative OB ROS                             Anesthesia Physical Anesthesia Plan  ASA: 3  Anesthesia Plan: General and Regional   Post-op Pain Management: Regional block* and Tylenol PO (pre-op)*   Induction: Intravenous  PONV Risk Score and Plan: 4 or greater and TIVA and Propofol infusion  Airway Management Planned: LMA  Additional Equipment:   Intra-op Plan:   Post-operative Plan: Extubation in OR  Informed Consent: I have reviewed the patients History and Physical, chart, labs and discussed the procedure including the risks, benefits and alternatives for the proposed anesthesia with the patient or authorized representative who has indicated his/her understanding and acceptance.     Dental advisory given  Plan Discussed with:  CRNA  Anesthesia Plan Comments: (Allergy listed to nitrous- was told that was what caused her nausea with one anesthetic. Did fine with last anesthetic w/ scop patch, sevo. No true allergy to nitrous)        Anesthesia Quick Evaluation

## 2023-03-31 NOTE — Progress Notes (Signed)

## 2023-03-31 NOTE — Progress Notes (Signed)
HPI:   Darlene Patterson was previously seen in the [facility] due to a personal history of breast cancer and concerns regarding a hereditary predisposition to cancer.    Darlene Patterson recent genetic test results were disclosed to her by telephone. These results and recommendations are discussed in more detail below.  CANCER HISTORY:  Oncology History  Malignant neoplasm of upper-inner quadrant of right breast in female, estrogen receptor positive (HCC)  03/12/2023 Initial Diagnosis   Screening mammogram detected right breast asymmetry at 1 o'clock position measuring 0.6 cm.  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, Ki-67 5%   03/18/2023 Cancer Staging   Staging form: Breast, AJCC 8th Edition - Clinical stage from 03/18/2023: Stage IA (cT1b, cN0, cM0, G2, ER+, PR+, HER2-) - Signed by Ronny Bacon, PA-C on 03/18/2023 Stage prefix: Initial diagnosis Method of lymph node assessment: Clinical Histologic grading system: 3 grade system   03/27/2023 Genetic Testing   Negative [facility] CancerNext+RNAinsight Panel.  Report date is 03/27/2023.   The [facility] CancerNext+RNAinsight Panel includes sequencing, rearrangement analysis, and RNA analysis for the following 39 genes: APC, ATM, BAP1, BARD1, BMPR1A, BRCA1, BRCA2, BRIP1, CDH1, CDKN2A, CHEK2, FH, FLCN, MET, MLH1, MSH2, MSH6, MUTYH, NF1, NTHL1, PALB2, PMS2, PTEN, RAD51C, RAD51D, SMAD4, STK11, TP53, TSC1, TSC2, and VHL (sequencing and deletion/duplication); AXIN2, HOXB13, MBD4, MSH3, POLD1 and POLE (sequencing only); EPCAM and GREM1 (deletion/duplication only).     FAMILY HISTORY:  We obtained a detailed, 4-generation family history.  She did not report a known family history of cancer.         Darlene Patterson is unaware of previous family history of genetic testing for hereditary cancer risks. There is no reported Ashkenazi Jewish ancestry. There is no known  consanguinity.    GENETIC TEST RESULTS:  The [facility] CancerNext+RNAinsight Panel found no pathogenic mutations.   The [facility] CancerNext+RNAinsight Panel includes sequencing, rearrangement analysis, and RNA analysis for the following 39 genes: APC, ATM, BAP1, BARD1, BMPR1A, BRCA1, BRCA2, BRIP1, CDH1, CDKN2A, CHEK2, FH, FLCN, MET, MLH1, MSH2, MSH6, MUTYH, NF1, NTHL1, PALB2, PMS2, PTEN, RAD51C, RAD51D, SMAD4, STK11, TP53, TSC1, TSC2, and VHL (sequencing and deletion/duplication); AXIN2, HOXB13, MBD4, MSH3, POLD1 and POLE (sequencing only); EPCAM and GREM1 (deletion/duplication only). .   The test report has been scanned into EPIC and is located under the Molecular Pathology section of the Results Review tab.  A portion of the result report is included below for reference. Genetic testing reported out on March 27, 2023.     Even though a pathogenic variant was not identified, possible explanations for the cancer in the family may include: There may be no hereditary risk for cancer in the family. The cancers in Darlene Patterson and/or her family may be sporadic/familial or due to other genetic and environmental factors.  Most cancer is not hereditary.  There may be a gene mutation in one of these genes that current testing methods cannot detect but that chance is small. There could be another gene that has not yet been discovered, or that we have not yet tested, that is responsible for the cancer diagnoses in the family.    Therefore, it is important to remain in touch with cancer genetics in the future so that we can continue to offer Darlene Patterson the most up to date genetic testing.    ADDITIONAL GENETIC TESTING:   Darlene Patterson genetic testing was fairly extensive.  If there are additional  relevant genes identified to increase cancer risk that can be analyzed in the future, we would be happy to discuss and coordinate this testing at that time.      CANCER SCREENING RECOMMENDATIONS:  Ms.  Patterson test result is considered negative (normal).  This means that we have not identified a hereditary cause for her personal history of breast cancer at this time.   An individual's cancer risk and medical management are not determined by genetic test results alone. Overall cancer risk assessment incorporates additional factors, including personal medical history, family history, and any available genetic information that may result in a personalized plan for cancer prevention and surveillance. Therefore, it is recommended she continue to follow the cancer management and screening guidelines provided by her oncology and primary healthcare provider.    RECOMMENDATIONS FOR FAMILY MEMBERS:   Since she did not inherit a identifiable mutation in a cancer predisposition gene included on this panel, her children could not have inherited a known mutation from her in one of these genes. Individuals in this family might be at some increased risk of developing cancer, over the general population risk, due to the family history of cancer.  Individuals in the family should notify their providers of the family history of cancer. We recommend women in this family have a yearly mammogram beginning at age 38, or 20 years younger than the earliest onset of cancer, an annual clinical breast exam, and perform monthly breast self-exams.  Risk models that take into account family history and hormonal history may be helpful in determining appropriate breast cancer screening options for family members.    FOLLOW-UP:  Cancer genetics is a rapidly advancing field and it is possible that new genetic tests will be appropriate for her and/or her family members in the future. We encourage Ms. Patterson to remain in contact with cancer genetics, so we can update her personal and family histories and let her know of advances in cancer genetics that may benefit this family.   Our contact number was provided.  They are welcome  to call us at anytime with additional questions or concerns.   Athira Janowicz M. Rennie Plowman, MS, [facility] Genetic Counselor [email or web address] (P) [phone]

## 2023-04-01 ENCOUNTER — Ambulatory Visit
Admission: RE | Admit: 2023-04-01 | Discharge: 2023-04-01 | Disposition: A | Discharge status: Home / Self Care | Payer: Commercial Managed Care - PPO | Source: Ambulatory Visit | Attending: General Surgery | Admitting: General Surgery

## 2023-04-01 ENCOUNTER — Encounter (HOSPITAL_BASED_OUTPATIENT_CLINIC_OR_DEPARTMENT_OTHER): Payer: Self-pay | Admitting: General Surgery

## 2023-04-01 ENCOUNTER — Other Ambulatory Visit: Payer: Self-pay

## 2023-04-01 ENCOUNTER — Ambulatory Visit (HOSPITAL_BASED_OUTPATIENT_CLINIC_OR_DEPARTMENT_OTHER): Payer: Commercial Managed Care - PPO

## 2023-04-01 ENCOUNTER — Encounter (HOSPITAL_BASED_OUTPATIENT_CLINIC_OR_DEPARTMENT_OTHER)
Admission: RE | Disposition: A | Discharge status: Home / Self Care | Payer: Self-pay | Source: Home / Self Care | Attending: General Surgery

## 2023-04-01 ENCOUNTER — Ambulatory Visit (HOSPITAL_BASED_OUTPATIENT_CLINIC_OR_DEPARTMENT_OTHER)
Admission: RE | Admit: 2023-04-01 | Discharge: 2023-04-01 | Disposition: A | Discharge status: Home / Self Care | Payer: Commercial Managed Care - PPO | Attending: General Surgery | Admitting: General Surgery

## 2023-04-01 DIAGNOSIS — Z6841 Body Mass Index (BMI) 40.0 and over, adult: Secondary | ICD-10-CM

## 2023-04-01 DIAGNOSIS — I739 Peripheral vascular disease, unspecified: Secondary | ICD-10-CM | POA: Diagnosis not present

## 2023-04-01 DIAGNOSIS — F419 Anxiety disorder, unspecified: Secondary | ICD-10-CM

## 2023-04-01 DIAGNOSIS — Z170 Estrogen receptor positive status [ER+]: Secondary | ICD-10-CM | POA: Diagnosis not present

## 2023-04-01 DIAGNOSIS — Z9884 Bariatric surgery status: Secondary | ICD-10-CM | POA: Diagnosis not present

## 2023-04-01 DIAGNOSIS — C50911 Malignant neoplasm of unspecified site of right female breast: Secondary | ICD-10-CM

## 2023-04-01 DIAGNOSIS — C50211 Malignant neoplasm of upper-inner quadrant of right female breast: Secondary | ICD-10-CM | POA: Insufficient documentation

## 2023-04-01 DIAGNOSIS — Z01818 Encounter for other preprocedural examination: Secondary | ICD-10-CM

## 2023-04-01 HISTORY — DX: Personal history of urinary calculi: Z87.442

## 2023-04-01 HISTORY — PX: BREAST LUMPECTOMY WITH RADIOACTIVE SEED AND SENTINEL LYMPH NODE BIOPSY: SHX6550

## 2023-04-01 SURGERY — BREAST LUMPECTOMY WITH RADIOACTIVE SEED AND SENTINEL LYMPH NODE BIOPSY
Anesthesia: Regional | Site: Breast | Laterality: Right

## 2023-04-01 MED ORDER — HEMOSTATIC AGENTS (NO CHARGE) OPTIME
TOPICAL | Status: DC | PRN
  Administered 2023-04-01: 1 via TOPICAL

## 2023-04-01 MED ORDER — SCOPOLAMINE 1 MG/3DAYS TD PT72
MEDICATED_PATCH | TRANSDERMAL | Status: AC
  Filled 2023-04-01: qty 1

## 2023-04-01 MED ORDER — MIDAZOLAM HCL 2 MG/2ML IJ SOLN
INTRAMUSCULAR | Status: AC
  Filled 2023-04-01: qty 2

## 2023-04-01 MED ORDER — DEXAMETHASONE SODIUM PHOSPHATE 10 MG/ML IJ SOLN
INTRAMUSCULAR | Status: DC | PRN
  Administered 2023-04-01: 10 mg via INTRAVENOUS

## 2023-04-01 MED ORDER — PROPOFOL 10 MG/ML IV BOLUS
INTRAVENOUS | Status: DC | PRN
  Administered 2023-04-01: 200 mg via INTRAVENOUS

## 2023-04-01 MED ORDER — FENTANYL CITRATE (PF) 100 MCG/2ML IJ SOLN
100.0000 ug | Freq: Once | INTRAMUSCULAR | Status: AC
  Administered 2023-04-01: 100 ug via INTRAVENOUS

## 2023-04-01 MED ORDER — SODIUM CHLORIDE 0.9 % IV SOLN
INTRAVENOUS | Status: AC
  Filled 2023-04-01: qty 3

## 2023-04-01 MED ORDER — AMISULPRIDE (ANTIEMETIC) 5 MG/2ML IV SOLN: 10.0000 mg | Freq: Once | INTRAVENOUS | Status: DC | PRN

## 2023-04-01 MED ORDER — FENTANYL CITRATE (PF) 100 MCG/2ML IJ SOLN
INTRAMUSCULAR | Status: DC | PRN
  Administered 2023-04-01: 50 ug via INTRAVENOUS

## 2023-04-01 MED ORDER — KETOROLAC TROMETHAMINE 15 MG/ML IJ SOLN: 15.0000 mg | INTRAMUSCULAR | Status: DC

## 2023-04-01 MED ORDER — OXYCODONE HCL 5 MG PO TABS: 5.0000 mg | ORAL_TABLET | Freq: Once | ORAL | Status: DC | PRN

## 2023-04-01 MED ORDER — ACETAMINOPHEN 500 MG PO TABS
ORAL_TABLET | ORAL | Status: AC
  Filled 2023-04-01: qty 2

## 2023-04-01 MED ORDER — CEFAZOLIN SODIUM 3 G IV SOLR: 3.0000 g | INTRAVENOUS | Status: DC

## 2023-04-01 MED ORDER — LACTATED RINGERS IV SOLN: INTRAVENOUS | Status: DC | PRN

## 2023-04-01 MED ORDER — SCOPOLAMINE 1 MG/3DAYS TD PT72SCOPOLAMINE 1 MG/3DAYS
1.0000 | MEDICATED_PATCH | TRANSDERMAL | Status: DC
Start: 2023-04-01 — End: 2023-04-01
  Administered 2023-04-01: 1.5 mg via TRANSDERMAL

## 2023-04-01 MED ORDER — PROPOFOL 10 MG/ML IV BOLUS
INTRAVENOUS | Status: AC
  Filled 2023-04-01: qty 20

## 2023-04-01 MED ORDER — ONDANSETRON HCL 4 MG/2ML IJ SOLN: 4.0000 mg | Freq: Once | INTRAMUSCULAR | Status: DC | PRN

## 2023-04-01 MED ORDER — ONDANSETRON HCL 4 MG/2ML IJ SOLN
INTRAMUSCULAR | Status: DC | PRN
  Administered 2023-04-01: 4 mg via INTRAVENOUS

## 2023-04-01 MED ORDER — OXYCODONE HCL 5 MG/5ML PO SOLN: 5.0000 mg | Freq: Once | ORAL | Status: DC | PRN

## 2023-04-01 MED ORDER — PHENYLEPHRINE HCL (PRESSORS) 10 MG/ML IV SOLN
INTRAVENOUS | Status: DC | PRN
  Administered 2023-04-01 (×3): 160 ug via INTRAVENOUS

## 2023-04-01 MED ORDER — BUPIVACAINE HCL (PF) 0.25 % IJ SOLN
INTRAMUSCULAR | Status: DC | PRN
  Administered 2023-04-01: 5 mL

## 2023-04-01 MED ORDER — OXYCODONE HCL 5 MG PO TABS: 5.0000 mg | ORAL_TABLET | Freq: Four times a day (QID) | ORAL | 0 refills | Status: DC | PRN

## 2023-04-01 MED ORDER — SODIUM CHLORIDE 0.9 % IV SOLN: INTRAVENOUS | Status: DC | PRN

## 2023-04-01 MED ORDER — HYDROMORPHONE HCL 1 MG/ML IJ SOLN
0.2500 mg | INTRAMUSCULAR | Status: DC | PRN
Start: 2023-04-01 — End: 2023-04-01
  Administered 2023-04-01: 0.5 mg via INTRAVENOUS

## 2023-04-01 MED ORDER — PROPOFOL 500 MG/50ML IV EMUL
INTRAVENOUS | Status: DC | PRN
  Administered 2023-04-01: 250 ug/kg/min via INTRAVENOUS

## 2023-04-01 MED ORDER — DEXAMETHASONE SODIUM PHOSPHATE 10 MG/ML IJ SOLN
INTRAMUSCULAR | Status: AC
  Filled 2023-04-01: qty 1

## 2023-04-01 MED ORDER — ROPIVACAINE HCL 5 MG/ML IJ SOLN
INTRAMUSCULAR | Status: DC | PRN
  Administered 2023-04-01: 20 mL via PERINEURAL

## 2023-04-01 MED ORDER — MAGTRACE LYMPHATIC TRACER
INTRAMUSCULAR | Status: DC | PRN
  Administered 2023-04-01: 2 mL via INTRAMUSCULAR

## 2023-04-01 MED ORDER — FENTANYL CITRATE (PF) 100 MCG/2ML IJ SOLN
INTRAMUSCULAR | Status: AC
  Filled 2023-04-01: qty 2

## 2023-04-01 MED ORDER — KETOROLAC TROMETHAMINE 15 MG/ML IJ SOLN
INTRAMUSCULAR | Status: AC
  Filled 2023-04-01: qty 1

## 2023-04-01 MED ORDER — LIDOCAINE 2% (20 MG/ML) 5 ML SYRINGE
INTRAMUSCULAR | Status: AC
  Filled 2023-04-01: qty 5

## 2023-04-01 MED ORDER — LIDOCAINE HCL (CARDIAC) PF 100 MG/5ML IV SOSY
PREFILLED_SYRINGE | INTRAVENOUS | Status: DC | PRN
  Administered 2023-04-01: 60 mg via INTRAVENOUS

## 2023-04-01 MED ORDER — HYDROMORPHONE HCL 1 MG/ML IJ SOLN
INTRAMUSCULAR | Status: AC
  Filled 2023-04-01: qty 0.5

## 2023-04-01 MED ORDER — ONDANSETRON HCL 4 MG/2ML IJ SOLN
INTRAMUSCULAR | Status: AC
  Filled 2023-04-01: qty 2

## 2023-04-01 MED ORDER — BUPIVACAINE LIPOSOME 1.3 % IJ SUSP
INTRAMUSCULAR | Status: DC | PRN
  Administered 2023-04-01: 10 mL via PERINEURAL

## 2023-04-01 MED ORDER — MIDAZOLAM HCL 2 MG/2ML IJ SOLN
2.0000 mg | Freq: Once | INTRAMUSCULAR | Status: AC
  Administered 2023-04-01: 2 mg via INTRAVENOUS

## 2023-04-01 MED ORDER — EPHEDRINE SULFATE (PRESSORS) 50 MG/ML IJ SOLN
INTRAMUSCULAR | Status: DC | PRN
  Administered 2023-04-01 (×2): 15 mg via INTRAVENOUS

## 2023-04-01 MED ORDER — BUPIVACAINE HCL (PF) 0.25 % IJ SOLN
INTRAMUSCULAR | Status: AC
  Filled 2023-04-01: qty 30

## 2023-04-01 MED ORDER — CEFAZOLIN SODIUM-DEXTROSE 2-3 GM-%(50ML) IV SOLR
INTRAVENOUS | Status: DC | PRN
  Administered 2023-04-01: 3 g via INTRAVENOUS

## 2023-04-01 MED ORDER — ACETAMINOPHEN 500 MG PO TABS
1000.0000 mg | ORAL_TABLET | ORAL | Status: AC
  Administered 2023-04-01: 1000 mg via ORAL

## 2023-04-01 MED ORDER — LACTATED RINGERS IV SOLN: INTRAVENOUS | Status: DC

## 2023-04-01 SURGICAL SUPPLY — 49 items
APPLIER CLIP 9.375 MED OPEN (MISCELLANEOUS) ×1 IMPLANT
BINDER BREAST LRG (GAUZE/BANDAGES/DRESSINGS) IMPLANT
BINDER BREAST MEDIUM (GAUZE/BANDAGES/DRESSINGS) IMPLANT
BINDER BREAST XLRG (GAUZE/BANDAGES/DRESSINGS) IMPLANT
BINDER BREAST XXLRG (GAUZE/BANDAGES/DRESSINGS) IMPLANT
BLADE SURG 15 STRL LF DISP TIS (BLADE) ×1 IMPLANT
CANISTER SUC SOCK COL 7IN (MISCELLANEOUS) IMPLANT
CANISTER SUCT 1200ML W/VALVE (MISCELLANEOUS) IMPLANT
CHLORAPREP W/TINT 26 (MISCELLANEOUS) ×1 IMPLANT
CLIP APPLIE 9.375 MED OPEN (MISCELLANEOUS) IMPLANT
CLIP TI WIDE RED SMALL 6 (CLIP) ×1 IMPLANT
COVER BACK TABLE 60X90IN (DRAPES) ×1 IMPLANT
COVER MAYO STAND STRL (DRAPES) ×1 IMPLANT
COVER PROBE CYLINDRICAL 5X96 (MISCELLANEOUS) ×1 IMPLANT
DERMABOND ADVANCED .7 DNX12 (GAUZE/BANDAGES/DRESSINGS) ×1 IMPLANT
DRAPE LAPAROSCOPIC ABDOMINAL (DRAPES) ×1 IMPLANT
DRAPE UTILITY XL STRL (DRAPES) ×1 IMPLANT
ELECT COATED BLADE 2.86 ST (ELECTRODE) ×1 IMPLANT
ELECT REM PT RETURN 9FT ADLT (ELECTROSURGICAL) ×1 IMPLANT
ELECTRODE REM PT RTRN 9FT ADLT (ELECTROSURGICAL) ×1 IMPLANT
GLOVE BIO SURGEON STRL SZ7 (GLOVE) ×2 IMPLANT
GLOVE BIOGEL PI IND STRL 7.5 (GLOVE) ×1 IMPLANT
GOWN STRL REUS W/ TWL LRG LVL3 (GOWN DISPOSABLE) ×2 IMPLANT
HEMOSTAT ARISTA ABSORB 3G PWDR (HEMOSTASIS) IMPLANT
KIT MARKER MARGIN INK (KITS) ×1 IMPLANT
NDL HYPO 25X1 1.5 SAFETY (NEEDLE) ×1 IMPLANT
NDL SAFETY ECLIPSE 18X1.5 (NEEDLE) IMPLANT
NEEDLE HYPO 25X1 1.5 SAFETY (NEEDLE) ×2 IMPLANT
NS IRRIG 1000ML POUR BTL (IV SOLUTION) IMPLANT
PACK BASIN DAY SURGERY FS (CUSTOM PROCEDURE TRAY) ×1 IMPLANT
PENCIL SMOKE EVACUATOR (MISCELLANEOUS) ×1 IMPLANT
RETRACTOR ONETRAX LX 90X20 (MISCELLANEOUS) IMPLANT
SLEEVE SCD COMPRESS KNEE MED (STOCKING) ×1 IMPLANT
SPIKE FLUID TRANSFER (MISCELLANEOUS) IMPLANT
SPONGE T-LAP 4X18 ~~LOC~~+RFID (SPONGE) ×1 IMPLANT
STRIP CLOSURE SKIN 1/2X4 (GAUZE/BANDAGES/DRESSINGS) ×1 IMPLANT
SUT ETHILON 2 0 FS 18 (SUTURE) IMPLANT
SUT MNCRL AB 4-0 PS2 18 (SUTURE) ×1 IMPLANT
SUT MON AB 5-0 PS2 18 (SUTURE) IMPLANT
SUT SILK 2 0 SH (SUTURE) IMPLANT
SUT VIC AB 2-0 SH 27XBRD (SUTURE) ×1 IMPLANT
SUT VIC AB 3-0 SH 27X BRD (SUTURE) ×1 IMPLANT
SUT VIC AB 5-0 PS2 18 (SUTURE) IMPLANT
SYR CONTROL 10ML LL (SYRINGE) ×1 IMPLANT
TOWEL GREEN STERILE FF (TOWEL DISPOSABLE) ×1 IMPLANT
TRACER MAGTRACE VIAL (MISCELLANEOUS) IMPLANT
TRAY FAXITRON CT DISP (TRAY / TRAY PROCEDURE) ×1 IMPLANT
TUBE CONNECTING 20X1/4 (TUBING) IMPLANT
YANKAUER SUCT BULB TIP NO VENT (SUCTIONS) IMPLANT

## 2023-04-01 NOTE — Transfer of Care (Signed)
Immediate Anesthesia Transfer of Care Note  Patient: Darlene Patterson  Procedure(s) Performed: RIGHT BREAST SEED GUIDED LUMPECTOMY AND RIGHT AXILLARY SENTINEL NODE BIOPSY (Right: Breast)  Patient Location: PACU  Anesthesia Type:General and Regional  Level of Consciousness: awake, alert , and patient cooperative  Airway & Oxygen Therapy: Patient Spontanous Breathing and Patient connected to face mask oxygen  Post-op Assessment: Report given to RN and Post -op Vital signs reviewed and stable  Post vital signs: Reviewed and stable  Last Vitals:  Vitals Value Taken Time  BP    Temp    Pulse    Resp    SpO2      Last Pain:  Vitals:   04/01/23 0632  TempSrc: Temporal  PainSc: 0-No pain      Patients Stated Pain Goal: 3 (04/01/23 8295)  Complications: No notable events documented.

## 2023-04-01 NOTE — H&P (Signed)
72 yof with screening detected right breast asymmetry. No prior breast history. No mass or dc. She has b density breasts. She has a 6x6x4 mm mass. Ax [place] is negative. Biopsy is a grade I-II IDC that is 99% er and pr positive, her 2 negative and Ki is 5%. She is here today to discuss options  Review of Systems: A complete review of systems was obtained from the patient. I have reviewed this information and discussed as appropriate with the patient. See HPI as well for other ROS.  Review of Systems  All other systems reviewed and are negative.   Medical History: Past Medical History:  Diagnosis Date  Anxiety  History of cancer  Hypertension   Patient Active Problem List  Diagnosis  History of laparoscopic adjustable gastric banding  Breast cancer (CMS/HHS-HCC)  Malignant neoplasm of upper-inner quadrant of right breast in female, estrogen receptor positive (CMS/HHS-HCC)   Past Surgical History:  Procedure Laterality Date  .Laparoscopic gastric banding N/A 10/07/2011  REPAIR HIATAL HERNIA N/A 10/07/2011  Cystoscopy with ureteroscopy and stent placement Left 05/31/2014  . Right Breast Biopsy Right 03/12/2023  CHOLECYSTECTOMY  HYSTERECTOMY    Allergies  Allergen Reactions  Carbon Dioxide Other (See Comments) and Nausea And Vomiting  Pt experiences vomiting when having CO2 with anesthesia during surgeries.   Current Outpatient Medications on File Prior to Visit  Medication Sig Dispense Refill  ZEPBOUND 5 mg/0.5 mL pen injector Inject 5 mg subcutaneously once a week  aspirin 81 MG EC tablet Take 81 mg by mouth every morning  cetirizine (ZYRTEC) 10 MG tablet Take 10 mg by mouth once daily  cyanocobalamin, vitamin B-12, 500 mcg Subl Place 1 tablet under the tongue once daily  venlafaxine (EFFEXOR-XR) 75 MG XR capsule Take 75 mg by mouth once daily    Family History  Problem Relation Age of Onset  Stroke Mother  High blood pressure (Hypertension) Sister    Social History    Tobacco Use  Smoking Status Never  Smokeless Tobacco Never  Marital status: Unknown  Tobacco Use  Smoking status: Never  Smokeless tobacco: Never  Vaping Use  Vaping status: Never Used  Substance and Sexual Activity  Alcohol use: Yes  Drug use: Never   Objective:   Physical Exam Vitals reviewed.  Constitutional:  Appearance: Normal appearance.  Chest:  Breasts: Right: No inverted nipple, mass or nipple discharge.  Left: No inverted nipple, mass or nipple discharge.  Lymphadenopathy:  Upper Body:  Right upper body: No supraclavicular or axillary adenopathy.  Left upper body: No supraclavicular or axillary adenopathy.  Neurological:  Mental Status: She is alert.   Assessment and Plan:   Malignant neoplasm of upper-inner quadrant of right breast in female, estrogen receptor positive (CMS/HHS-HCC)  Right breast seed guided lumpectomy, right ax sn biopsy  We discussed the staging and pathophysiology of breast cancer. We discussed all of the different options for treatment for breast cancer including surgery, chemotherapy, radiation therapy, and antiestrogen therapy.  We discussed a sentinel lymph node biopsy as she does not appear to having lymph node involvement right now. We discussed the performance of that with injection of radioactive tracer. We discussed that there is a chance of having a positive node with a sentinel lymph node biopsy and we will await the permanent pathology to make any other first further decisions in terms of her treatment. We discussed up to a 5% risk lifetime of chronic shoulder pain as well as lymphedema associated with a sentinel lymph node  biopsy.  We discussed the options for treatment of the breast cancer which included lumpectomy versus a mastectomy. We discussed the performance of the lumpectomy with radioactive seed placement. We discussed a 5-10% chance of a positive margin requiring reexcision in the operating room. We also discussed that  she will likely need radiation therapy if she undergoes lumpectomy. We discussed mastectomy and the postoperative care for that as well. Mastectomy can be followed by reconstruction. The decision for lumpectomy vs mastectomy has no impact on decision for chemotherapy. Most mastectomy patients will not need radiation therapy. We discussed that there is no difference in her survival whether she undergoes lumpectomy with radiation therapy or antiestrogen therapy versus a mastectomy. There is also no real difference between her recurrence in the breast.  We discussed the risks of operation including bleeding, infection, possible reoperation. She understands her further therapy will be based on what her stages at the time of her operation.

## 2023-04-01 NOTE — Op Note (Signed)
Preoperative diagnosis: clinical stage I right breast cancer Postoperative diagnosis: Same as above Procedure: 1. Right breast radioactive seed guided lumpectomy 2.  Injection of mag trace for sentinel lymph node identification 3.  Right deep axillary sentinel lymph node biopsy Surgeon: Dr. Harden Mo Anesthesia: General With a pectoral block Estimated blood loss: 50 cc Specimens: 1.  Right breast tissue marked with paint containing seed and clip 2.  Right deep axillary sentinel lymph node with highest count of 1989 Complications: None Drains: None Sponge needle count was correct completion Disposition recovery stable condition   Indications:52 yof with screening detected right breast asymmetry. No prior breast history. No mass or dc. She has b density breasts. She has a 6x6x4 mm mass. Ax [place] is negative. Biopsy is a grade I-II IDC that is 99% er and pr positive, her 2 negative and Ki is 5%. We discussed lumpectomy and sn biopsy.    Procedure: After informed consent was obtained she first underwent a pectoral block.  She was given antibiotics.  SCDs were in place.  She was placed under general anesthesia without complication.  She was prepped and draped in standard sterile surgical fashion.  A surgical timeout was then performed.   I injected 1.5 cc of mag trace in the subareolar position and massaged this for 5 minutes.   I then identified the radioactive seed in the central breast. I made periareolar incision in order to hide the scar later.    I used cautery to remove the seed and the surrounding tissue with an attempt to get a clear margin.  Mammogram confirmed removal of the seed and the clip.  3D imaging showed it appeared my margins were all clear.    There are clips in the cavity. I obtained hemostasis.  I then closed this with 2-0 Vicryl. I closed the skin with 3-0 vicryl and 5-0 monocryl  I made a curvinlinear incision and  I then entered into the axilla.  There was at least one  visible node with counts as above and another packet with low lying activity that I removed.    There were no other brown staining, palpable, or nodes with activity present.  I then obtained hemostasis.  I closed this with 2-0 Vicryl, 3-0 Vicryl, and 4-0 Monocryl.  Glue and Steri-Strips were applied.  She tolerated this well and was transferred to the recovery room in stable condition.

## 2023-04-01 NOTE — Anesthesia Postprocedure Evaluation (Signed)
Anesthesia Post Note  Patient: Darlene Patterson  Procedure(s) Performed: RIGHT BREAST SEED GUIDED LUMPECTOMY AND RIGHT AXILLARY SENTINEL NODE BIOPSY (Right: Breast)     Patient location during evaluation: PACU Anesthesia Type: Regional and General Level of consciousness: awake and alert, oriented and patient cooperative Pain management: pain level controlled Vital Signs Assessment: post-procedure vital signs reviewed and stable Respiratory status: spontaneous breathing, nonlabored ventilation and respiratory function stable Cardiovascular status: blood pressure returned to baseline and stable Postop Assessment: no apparent nausea or vomiting Anesthetic complications: no   No notable events documented.  Last Vitals:  Vitals:   04/01/23 0915 04/01/23 0926  BP: 114/88 (!) 146/75  Pulse: 82 80  Resp: 15 20  Temp:  36.5 C  SpO2: 93% 93%    Last Pain:  Vitals:   04/01/23 0926  TempSrc:   PainSc: 4                  Lannie Fields

## 2023-04-01 NOTE — Progress Notes (Signed)
Assisted Dr. Salvadore Farber with right, pectoralis, ultrasound guided block. Side rails up, monitors on throughout procedure. See vital signs in flow sheet. Tolerated Procedure well.

## 2023-04-01 NOTE — Interval H&P Note (Signed)
History and Physical Interval Note:  04/01/2023 6:32 AM  Darlene Patterson  has presented today for surgery, with the diagnosis of RIGHT BREAST CANCER.  The various methods of treatment have been discussed with the patient and family. After consideration of risks, benefits and other options for treatment, the patient has consented to  Procedure(s) with comments: RIGHT BREAST SEED GUIDED LUMPECTOMY AND RIGHT AXILLARY SENTINEL NODE BIOPSY (Right) - pec block as a surgical intervention.  The patient's history has been reviewed, patient examined, no change in status, stable for surgery.  I have reviewed the patient's chart and labs.  Questions were answered to the patient's satisfaction.     Emelia Loron

## 2023-04-01 NOTE — Anesthesia Procedure Notes (Signed)
Procedure Name: LMA Insertion Date/Time: 04/01/2023 7:47 AM  Performed by: Karen Kitchens, CRNAPre-anesthesia Checklist: Patient identified, Emergency Drugs available, Suction available and Patient being monitored Patient Re-evaluated:Patient Re-evaluated prior to induction Oxygen Delivery Method: Circle system utilized Preoxygenation: Pre-oxygenation with 100% oxygen Induction Type: IV induction Ventilation: Mask ventilation without difficulty LMA: LMA inserted LMA Size: 4.0 Number of attempts: 1 Airway Equipment and Method: Bite block Placement Confirmation: positive ETCO2, breath sounds checked- equal and bilateral and CO2 detector Tube secured with: Tape Dental Injury: Teeth and Oropharynx as per pre-operative assessment

## 2023-04-01 NOTE — Anesthesia Procedure Notes (Signed)
Anesthesia Regional Block: Pectoralis block   Pre-Anesthetic Checklist: , timeout performed,  Correct Patient, Correct Site, Correct Laterality,  Correct Procedure, Correct Position, site marked,  Risks and benefits discussed,  Surgical consent,  Pre-op evaluation,  At surgeon's request and post-op pain management  Laterality: Right  Prep: Maximum Sterile Barrier Precautions used, chloraprep       Needles:  Injection technique: Single-shot  Needle Type: Echogenic Stimulator Needle     Needle Length: 9cm  Needle Gauge: 22     Additional Needles:   Procedures:,,,, ultrasound used (permanent image in chart),,    Narrative:  Start time: 04/01/2023 7:15 AM End time: 04/01/2023 7:20 AM Injection made incrementally with aspirations every 5 mL.  Performed by: Personally  Anesthesiologist: Lannie Fields, DO  Additional Notes: Monitors applied. No increased pain on injection. No increased resistance to injection. Injection made in 5cc increments. Good needle visualization. Patient tolerated procedure well.

## 2023-04-01 NOTE — Discharge Instructions (Addendum)
[facility] Office Phone Number [phone]  POST OP INSTRUCTIONS Take 400 mg of ibuprofen every 8 hours or 650 mg tylenol every 6 hours for next 72 hours then as needed. Use ice several times daily also.  A prescription for pain medication may be given to you upon discharge.  Take your pain medication as prescribed, if needed.  If narcotic pain medicine is not needed, then you may take acetaminophen (Tylenol), naprosyn (Alleve) or ibuprofen (Advil) as needed. Take your usually prescribed medications unless otherwise directed If you need a refill on your pain medication, please contact your pharmacy.  They will contact our office to request authorization.  Prescriptions will not be filled after 5pm or on week-ends. You should eat very light the first 24 hours after surgery, such as soup, crackers, pudding, etc.  Resume your normal diet the day after surgery. Most patients will experience some swelling and bruising in the breast.  Ice packs and a good support bra will help.  Wear the breast binder provided or a sports bra for 72 hours day and night.  After that wear a sports bra during the day until you return to the office. Swelling and bruising can take several days to resolve.  It is common to experience some constipation if taking pain medication after surgery.  Increasing fluid intake and taking a stool softener will usually help or prevent this problem from occurring.  A mild laxative (Milk of Magnesia or Miralax) should be taken according to package directions if there are no bowel movements after 48 hours. I used skin glue on the incision, you may shower in 24 hours.  The glue will flake off over the next 2-3 weeks.  Any sutures or staples will be removed at the office during your follow-up visit. ACTIVITIES:  You may resume regular daily activities (gradually increasing) beginning the next day.  Wearing a good support bra or sports bra minimizes pain and swelling.  You may have  sexual intercourse when it is comfortable. You may drive when you no longer are taking prescription pain medication, you can comfortably wear a seatbelt, and you can safely maneuver your car and apply brakes. RETURN TO WORK:  ______________________________________________________________________________________ Darlene Patterson should see your doctor in the office for a follow-up appointment approximately two weeks after your surgery.  Your doctor's nurse will typically make your follow-up appointment when she calls you with your pathology report.  Expect your pathology report 3-4 business days after your surgery.  You may call to check if you do not hear from [place] after three days. OTHER INSTRUCTIONS: _______________________________________________________________________________________________ _____________________________________________________________________________________________________________________________________ _____________________________________________________________________________________________________________________________________ _____________________________________________________________________________________________________________________________________  WHEN TO CALL DR WAKEFIELD: Fever over 101.0 Nausea and/or vomiting. Extreme swelling or bruising. Continued bleeding from incision. Increased pain, redness, or drainage from the incision.  The clinic staff is available to answer your questions during regular business hours.  Please don't hesitate to call and ask to speak to one of the nurses for clinical concerns.  If you have a medical emergency, go to the nearest emergency room or call 911.  A surgeon from [facility] is always on call at the hospital.  For further questions, please visit [email or web address]  Post Anesthesia Home Care Instructions  Activity: Get plenty of rest for the remainder of the day. A responsible individual must stay with  you for 24 hours following the procedure.  For the next 24 hours, DO NOT: -Drive a car -Advertising copywriter -Drink alcoholic beverages -Take any medication unless instructed by your  physician -Make any legal decisions or sign important papers.  Meals: Start with liquid foods such as gelatin or soup. Progress to regular foods as tolerated. Avoid greasy, spicy, heavy foods. If nausea and/or vomiting occur, drink only clear liquids until the nausea and/or vomiting subsides. Call your physician if vomiting continues.  Special Instructions/Symptoms: Your throat may feel dry or sore from the anesthesia or the breathing tube placed in your throat during surgery. If this causes discomfort, gargle with warm salt water. The discomfort should disappear within 24 hours.  If you had a scopolamine patch placed behind your ear for the management of post- operative nausea and/or vomiting:  1. The medication in the patch is effective for 72 hours, after which it should be removed.  Wrap patch in a tissue and discard in the trash. Wash hands thoroughly with soap and water. 2. You may remove the patch earlier than 72 hours if you experience unpleasant side effects which may include dry mouth, dizziness or visual disturbances. 3. Avoid touching the patch. Wash your hands with soap and water after contact with the patch.      Next dose of tylenol may be taken at a1:30

## 2023-04-02 ENCOUNTER — Encounter (HOSPITAL_BASED_OUTPATIENT_CLINIC_OR_DEPARTMENT_OTHER): Payer: Self-pay | Admitting: General Surgery

## 2023-04-03 ENCOUNTER — Encounter: Payer: Self-pay | Admitting: General Practice

## 2023-04-03 LAB — SURGICAL PATHOLOGY

## 2023-04-03 NOTE — Progress Notes (Signed)
[facility] Spiritual Care Note  Followed up with Ms [facility] by phone. She reports good support from family, friends, and coworkers, which is a big source of encouragement for her. She also notes that she is experiencing minimal pain after surgery (an occasional "throb, but nothing [she needs] to take pain meds for"), which is relieving and contributes to a sense of gratitude. Per Ms Tooley, no other needs at this time.  She is aware of ongoing Spiritual Care availability and plans to reach out as needed/desired for follow-up support.   [address] Rush [place], [place], [facility] Pager [phone] Voicemail [phone]

## 2023-04-06 ENCOUNTER — Telehealth: Payer: Self-pay | Admitting: *Deleted

## 2023-04-06 ENCOUNTER — Encounter: Payer: Self-pay | Admitting: *Deleted

## 2023-04-06 NOTE — Telephone Encounter (Signed)
Ordered oncotype per Dr. Pamelia Hoit. Sent requisition to pathology and exact sciences.

## 2023-04-17 ENCOUNTER — Encounter (HOSPITAL_COMMUNITY): Payer: Self-pay

## 2023-04-21 ENCOUNTER — Encounter: Payer: Self-pay | Admitting: *Deleted

## 2023-04-21 ENCOUNTER — Telehealth: Payer: Self-pay | Admitting: *Deleted

## 2023-04-21 DIAGNOSIS — C50211 Malignant neoplasm of upper-inner quadrant of right female breast: Secondary | ICD-10-CM

## 2023-04-21 NOTE — Telephone Encounter (Signed)
Received oncotype results of 19/5%. Referral placed for Dr. Mitzi Hansen

## 2023-04-23 ENCOUNTER — Telehealth: Payer: Self-pay | Admitting: *Deleted

## 2023-04-23 NOTE — Telephone Encounter (Signed)
[employer] F[employer]A paperwork completed by this nurse.  Form to collaborative pick up bin for provider review, signature and return to this nurse.

## 2023-04-27 NOTE — Progress Notes (Signed)
Radiation Oncology         [phone] ________________________________  Outpatient follow-up- Conducted via telephone at patient request.  I spoke with the patient to conduct this visit visit via telephone. The patient was notified in advance and was offered an in person or telemedicine meeting to allow for face to face communication but instead preferred to proceed with a telephone visit  Name: Darlene Patterson        MRN: [id number]  Date of Service: 04/30/2023 DOB: 10/29/1970  ON:GEXBMW, Scherrie November, DO  Serena Croissant, MD     REFERRING PHYSICIAN: Serena Croissant, MD   DIAGNOSIS: The encounter diagnosis was Malignant neoplasm of upper-inner quadrant of right breast in female, estrogen receptor positive (HCC).   HISTORY OF PRESENT ILLNESS: Darlene Patterson is a 53 y.o. female originally seen in the multidisciplinary breast clinic for a new diagnosis of right breast cancer. The patient was noted to have screening detected architectural distortion in the right breast. By ultrasound the lesion was seen in the  1:00 position measuring 6 mm and the axilla was negative for adenopathy. There were tiny appearing benign appearing cysts in the 12:00 position. 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   Since her last visit, the patient underwent a right lumpectomy with sentinel lymph node biopsy on 04/01/2023.  Final pathology showed a grade 2 invasive ductal carcinoma measuring 8 mm in greatest dimension with associated intermediate grade DCIS.  Her margins were negative for invasive disease the closest being 8 mm to the inferior and medial margin, and less than 1 mm to the inferior margin for DCIS.  2 sentinel lymph nodes were sampled and one contained metastatic disease.  Oncotype Dx score was performed on her specimen as well and confirmed ER/PR positivity, HER2 negativity, and the risk recurrence score was 19.  Dr. Pamelia Hoit  does not recommend systemic chemotherapy but will offer her adjuvant antiestrogen.  She is seen today to discuss adjuvant radiation.She's seen to discuss treatment recommendations of her cancer.  PREVIOUS RADIATION THERAPY: No   PAST MEDICAL HISTORY:  Past Medical History:  Diagnosis Date   Anxiety    with anesthesia   Cancer (HCC) 03/2023   right breast IDC   Complication of anesthesia    Great toe pain    cyst left great toe- to be excised 05-24-14- [place]   H/O hiatal hernia    per upper GI 3/13   Heart murmur    early teens- resolved   History of kidney stones    Obesity    Peripheral vascular disease (HCC)    varicose veins- left   PONV (postoperative nausea and vomiting)    'SEVERE PER PT"   Status post gastric banding        PAST SURGICAL HISTORY: Past Surgical History:  Procedure Laterality Date   ABDOMINAL HYSTERECTOMY  3/13   ACHILLES TENDON REPAIR  08, 09   bilateral   BREAST BIOPSY Right 03/12/2023   [place] RT BREAST BX W LOC DEV 1ST LESION IMG BX SPEC US GUIDE 03/12/2023 [facility]   BREAST BIOPSY  03/31/2023   MM RT RADIOACTIVE SEED LOC MAMMO GUIDE 03/31/2023 [facility]   BREAST LUMPECTOMY WITH RADIOACTIVE SEED AND SENTINEL LYMPH NODE BIOPSY Right 04/01/2023   Procedure: RIGHT BREAST SEED GUIDED LUMPECTOMY AND RIGHT AXILLARY SENTINEL NODE BIOPSY;  Surgeon: Emelia Loron, MD;  Location: [facility];  Service: General;  Laterality: Right;  pec block   BREATH TEK H PYLORI  05/21/2011   Procedure: BREATH TEK H PYLORI;  Surgeon: Valarie Merino, MD;  Location: [facility];  Service: General;  Laterality: N/A;   CESAREAN SECTION  89, 93   CHOLECYSTECTOMY  2011   CYSTOSCOPY W/ URETERAL STENT PLACEMENT Left 05/16/2014   Procedure: CYSTOSCOPY WITH RETROGRADE PYELOGRAM/URETERAL STENT PLACEMENT;  Surgeon: Crist Fat, MD;  Location: [facility];  Service: Urology;  Laterality: Left;   CYSTOSCOPY WITH URETEROSCOPY AND STENT PLACEMENT Left  05/31/2014   Procedure: LEFT URETEROSCOPY AND STENT EXCHANGE, LEFT RETROGRADE;  Surgeon: Crist Fat, MD;  Location: [facility];  Service: Urology;  Laterality: Left;   EYE SURGERY  73,85   repair lazy eye   HIATAL HERNIA REPAIR  10/07/2011   Procedure: LAPAROSCOPIC REPAIR OF HIATAL HERNIA;  Surgeon: Valarie Merino, MD;  Location: [facility];  Service: General;  Laterality: N/A;   LAPAROSCOPIC GASTRIC BANDING  10/07/2011   Procedure: LAPAROSCOPIC GASTRIC BANDING;  Surgeon: Valarie Merino, MD;  Location: [facility];  Service: General;  Laterality: N/A;   VASCULAR SURGERY     left leg- s/p  injections of veins     FAMILY HISTORY:  Family History  Problem Relation Age of Onset   COPD Mother    Hypertension Sister    Breast cancer Neg Hx      SOCIAL HISTORY:  reports that she has never smoked. She has never used smokeless tobacco. She reports current alcohol use. She reports that she does not use drugs. The patient is married and lives in [place]. She works for the [employer]. She is accompanied by her husband and partner. She has adult children.    ALLERGIES: Nitrous oxide   MEDICATIONS:  Current Outpatient Medications  Medication Sig Dispense Refill   Calcium Carb-Cholecalciferol (CALCIUM CARBONATE+VITAMIN D PO) Take 2 tablets by mouth daily. Calcium 500 mg with 25 mcg Vitamin D3     cetirizine (ZYRTEC) 10 MG tablet Take 10 mg by mouth daily.     Cyanocobalamin (VITAMIN B-12) 500 MCG SUBL Place 1 tablet under the tongue every morning.     OVER THE COUNTER MEDICATION Take 3 tablets by mouth daily. Fiber Gummies     OVER THE COUNTER MEDICATION Take 1.5 mLs by mouth daily. Soursop     oxyCODONE (OXY IR/ROXICODONE) 5 MG immediate release tablet Take 1 tablet (5 mg total) by mouth every 6 (six) hours as needed. 10 tablet 0   venlafaxine XR (EFFEXOR-XR) 75 MG 24 hr capsule Take 75 mg by mouth daily.     ZEPBOUND 2.5 MG/0.5ML Pen SMARTSIG:1 unspecified SUB-Q Once a Week     No current  facility-administered medications for this visit.     REVIEW OF SYSTEMS: On review of systems, the patient reports she is doing well. She has been doing manual massage at home of her arm, and is concerned she may have some cording in her axilla. No other complaints are verbalized.   PHYSICAL EXAM:  Unable to assess due to encounter type    ECOG = 0  0 - Asymptomatic (Fully active, able to carry on all predisease activities without restriction)  1 - Symptomatic but completely ambulatory (Restricted in physically strenuous activity but ambulatory and able to carry out work of a light or sedentary nature. For example, light housework, office work)  2 - Symptomatic, <50% in bed during the day (Ambulatory and capable of all self care  but unable to carry out any work activities. Up and about more than 50% of waking hours)  3 - Symptomatic, >50% in bed, but not bedbound (Capable of only limited self-care, confined to bed or chair 50% or more of waking hours)  4 - Bedbound (Completely disabled. Cannot carry on any self-care. Totally confined to bed or chair)  5 - Death   Santiago Glad MM, Creech RH, Tormey DC, et al. [phone]). "Toxicity and response criteria of the [facility] Group". Am. Evlyn Clines. Oncol. 5 (6): 649-55    LABORATORY DATA:  Lab Results  Component Value Date   WBC 10.0 03/18/2023   HGB 14.4 03/18/2023   HCT 42.6 03/18/2023   MCV 84.0 03/18/2023   PLT 341 03/18/2023   Lab Results  Component Value Date   NA 138 03/18/2023   K 4.0 03/18/2023   CL 106 03/18/2023   CO2 24 03/18/2023   Lab Results  Component Value Date   ALT 13 03/18/2023   AST 13 (L) 03/18/2023   ALKPHOS 75 03/18/2023   BILITOT 0.6 03/18/2023      RADIOGRAPHY: MM Breast Surgical Specimen Result Date: 04/01/2023 CLINICAL DATA:  Evaluate surgical specimen following lumpectomy for RIGHT breast cancer. EXAM: SPECIMEN RADIOGRAPH OF THE RIGHT BREAST COMPARISON:  Previous exam(s). FINDINGS:  Status post excision of the RIGHT breast. The radioactive seed and RIBBON biopsy clip are present and intact. IMPRESSION: Specimen radiograph of the RIGHT breast. Electronically Signed   By: Harmon Pier M.D.   On: 04/01/2023 08:14   MM RT RADIOACTIVE SEED LOC MAMMO GUIDE Result Date: 03/31/2023 CLINICAL DATA:  Preoperative seed localization prior to right breast lumpectomy. EXAM: MAMMOGRAPHIC GUIDED RADIOACTIVE SEED LOCALIZATION OF THE RIGHT BREAST COMPARISON:  Previous exam(s). FINDINGS: Patient presents for radioactive seed localization prior to right breast lumpectomy. I met with the patient and we discussed the procedure of seed localization including benefits and alternatives. We discussed the high likelihood of a successful procedure. We discussed the risks of the procedure including infection, bleeding, tissue injury and further surgery. We discussed the low dose of radioactivity involved in the procedure. Informed, written consent was given. The usual time-out protocol was performed immediately prior to the procedure. Using mammographic guidance, sterile technique, 1% lidocaine and an I-125 radioactive seed, ribbon shaped marker was localized using a superior approach. The follow-up mammogram images confirm the seed in the expected location and were marked for Dr. Dwain Sarna. Follow-up survey of the patient confirms presence of the radioactive seed. Order number of I-125 seed:  [id number]. Total activity:  0.244 millicurie reference Date: November 12, 2022 The patient tolerated the procedure well and was released from the [facility]. She was given instructions regarding seed removal. IMPRESSION: Radioactive seed localization right breast. No apparent complications. Electronically Signed   By: Ted Mcalpine M.D.   On: 03/31/2023 13:26       IMPRESSION/PLAN: 1. Stage IA, pT1bN1M0, grade 2, ER/PR positive invasive ductal carcinoma of the right breast. Dr. Mitzi Hansen has reviewed the patient's final  pathology findings, and today I reviewed this as well with her.  We reviewed the findings include nodal disease which was not known prior to her surgery.  She has done well since her surgical procedure, and is not planning any chemotherapy based on Oncotype DX score.  Dr. Mitzi Hansen does recommend external radiotherapy to the breast and regional lymph nodes to reduce risks of local recurrence. Dr. Pamelia Hoit anticipates adjuvant antiestrogen therapy to follow. We discussed the risks, benefits, short,  and long term effects of radiotherapy, as well as the curative intent, and the patient is interested in proceeding.  We reviewed the delivery and logistics of radiotherapy and that Dr. Mitzi Hansen recommends 6 1/2 weeks of radiotherapy to the right breast and regional lymph nodes. Written consent will be obtained Monday and provided to the patient when she comes for simulation. 2. Possible cording. I will reach out to PT about coordinating an appointment to evaluate this.     This encounter was conducted via telephone.  The patient has provided two factor identification and has given verbal consent for this type of encounter and has been advised to only accept a meeting of this type in a secure network environment. The time spent during this encounter was 45 minutes including preparation, discussion, and coordination of the patient's care. The attendants for this meeting include Ronny Bacon  and Larina Bras.  During the encounter,   Ronny Bacon was located remotely at home. Anhelica A Rappa was located at home.      Osker Mason, [facility]    **Disclaimer: This note was dictated with voice recognition software. Similar sounding words can inadvertently be transcribed and this note may contain transcription errors which may not have been corrected upon publication of note.**

## 2023-04-27 NOTE — Assessment & Plan Note (Signed)
03/12/2023:Screening mammogram detected right breast asymmetry at 1 o'clock position measuring 0.6 cm.  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, Ki-67 5%   Recommendations: Right Lumpectomy: Grade 2 IDC 0.8 cm, 1/2 LN Positive, Margins clear 2. Oncotype DX testing to determine if chemotherapy would be of any benefit 3. Adjuvant radiation therapy followed by 4. Adjuvant antiestrogen therapy ------------------------------------------------------------------------------------------------------------- RTC based on Oncotype results

## 2023-04-28 ENCOUNTER — Encounter: Payer: Self-pay | Admitting: Radiation Oncology

## 2023-04-28 ENCOUNTER — Encounter: Payer: Self-pay | Admitting: *Deleted

## 2023-04-28 ENCOUNTER — Inpatient Hospital Stay: Payer: Commercial Managed Care - PPO | Attending: Hematology and Oncology | Admitting: Hematology and Oncology

## 2023-04-28 VITALS — BP 151/92 | HR 99 | Temp 98.3°F | Resp 20 | Ht 67.0 in | Wt 271.4 lb

## 2023-04-28 DIAGNOSIS — C50211 Malignant neoplasm of upper-inner quadrant of right female breast: Secondary | ICD-10-CM | POA: Insufficient documentation

## 2023-04-28 DIAGNOSIS — Z170 Estrogen receptor positive status [ER+]: Secondary | ICD-10-CM | POA: Diagnosis not present

## 2023-04-28 DIAGNOSIS — Z1721 Progesterone receptor positive status: Secondary | ICD-10-CM | POA: Insufficient documentation

## 2023-04-28 DIAGNOSIS — Z1732 Human epidermal growth factor receptor 2 negative status: Secondary | ICD-10-CM | POA: Diagnosis not present

## 2023-04-28 NOTE — Telephone Encounter (Signed)
Returned today after being "Out of Office" since close of business 04/23/2023. Today received FMLA form signed by provider upon this nurse return to office.  Successfully faxed to [facility] [phone]) and mailed as requested by Larina Bras.  Envelope to [facility] file for patient pick up today.  Copy to [facility] H.I.M bin designated for items to be scanned.  No further instructions received, actions performed or required by this nurse.

## 2023-04-28 NOTE — Progress Notes (Signed)
Patient Care Team: Annamaria Helling, DO as PCP - General (Family Medicine) Himmelrich, Loree Fee, RD (Inactive) as Dietitian Donnelly Angelica, RN as Oncology Nurse Navigator Pershing Proud, RN as Oncology Nurse Navigator Emelia Loron, MD as Consulting Physician (General Surgery) Serena Croissant, MD as Consulting Physician (Hematology and Oncology) [name], MD as Consulting Physician (Radiation Oncology)  DIAGNOSIS:  Encounter Diagnosis  Name Primary?   Malignant neoplasm of upper-inner quadrant of right breast in female, estrogen receptor positive (HCC) Yes    SUMMARY OF ONCOLOGIC HISTORY: Oncology History  Malignant neoplasm of upper-inner quadrant of right breast in female, estrogen receptor positive (HCC)  03/12/2023 Initial Diagnosis   Screening mammogram detected right breast asymmetry at 1 o'clock position measuring 0.6 cm.  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, Ki-67 5%   03/18/2023 Cancer Staging   Staging form: Breast, AJCC 8th Edition - Clinical stage from 03/18/2023: Stage IA (cT1b, cN0, cM0, G2, ER+, PR+, HER2-) - Signed by Ronny Bacon, PA-C on 03/18/2023 Stage prefix: Initial diagnosis Method of lymph node assessment: Clinical Histologic grading system: 3 grade system   03/27/2023 Genetic Testing   Negative [facility] CancerNext+RNAinsight Panel.  Report date is 03/27/2023.   The [facility] CancerNext+RNAinsight Panel includes sequencing, rearrangement analysis, and RNA analysis for the following 39 genes: APC, ATM, BAP1, BARD1, BMPR1A, BRCA1, BRCA2, BRIP1, CDH1, CDKN2A, CHEK2, FH, FLCN, MET, MLH1, MSH2, MSH6, MUTYH, NF1, NTHL1, PALB2, PMS2, PTEN, RAD51C, RAD51D, SMAD4, STK11, TP53, TSC1, TSC2, and VHL (sequencing and deletion/duplication); AXIN2, HOXB13, MBD4, MSH3, POLD1 and POLE (sequencing only); EPCAM and GREM1 (deletion/duplication only).   04/27/2023 Cancer Staging   Staging form: Breast, AJCC  8th Edition - Pathologic stage from 04/27/2023: Stage IA (pT1b, pN1(sn), cM0, G2, ER+, PR+, HER2-, Oncotype DX score: 19) - Signed by Ronny Bacon, PA-C on 04/27/2023 Stage prefix: Initial diagnosis Method of lymph node assessment: Sentinel lymph node biopsy Multigene prognostic tests performed: Oncotype DX Recurrence score range: Greater than or equal to 11 Histologic grading system: 3 grade system     CHIEF COMPLIANT: Follow-up after recent surgery and Oncotype test results  HISTORY OF PRESENT ILLNESS:   History of Present Illness   Darlene A Garciamartinez "Breezy" is a 53 year old female with breast cancer who presents for follow-up regarding her Oncotype DX test results and treatment planning.  She has a history of breast cancer initially staged as IA with negative lymph nodes on ultrasound. However, during surgery, one of the two sampled lymph nodes was found to have cancer, raising concerns about the aggressiveness of the disease.  She recently underwent an Oncotype DX test, which returned a score of 19. Her estrogen receptor, progesterone receptor, and HER2 receptor statuses remain unchanged, being positive, positive, and negative, respectively.  She has prepared FMLA paperwork to accommodate any days she might feel extremely tired or unable to work during her treatment.         ALLERGIES:  is allergic to nitrous oxide.  MEDICATIONS:  Current Outpatient Medications  Medication Sig Dispense Refill   Calcium Carb-Cholecalciferol (CALCIUM CARBONATE+VITAMIN D PO) Take 2 tablets by mouth daily. Calcium 500 mg with 25 mcg Vitamin D3     cetirizine (ZYRTEC) 10 MG tablet Take 10 mg by mouth daily.     Cyanocobalamin (VITAMIN B-12) 500 MCG SUBL Place 1 tablet under the tongue every morning.     OVER THE COUNTER MEDICATION Take 3 tablets by  mouth daily. Fiber Gummies     OVER THE COUNTER MEDICATION Take 1.5 mLs by mouth daily. Soursop     oxyCODONE (OXY IR/ROXICODONE) 5 MG immediate  release tablet Take 1 tablet (5 mg total) by mouth every 6 (six) hours as needed. 10 tablet 0   venlafaxine XR (EFFEXOR-XR) 75 MG 24 hr capsule Take 75 mg by mouth daily.     ZEPBOUND 2.5 MG/0.5ML Pen SMARTSIG:1 unspecified SUB-Q Once a Week     No current facility-administered medications for this visit.    PHYSICAL EXAMINATION: ECOG PERFORMANCE STATUS: 1 - Symptomatic but completely ambulatory  Vitals:   04/28/23 1451  BP: (!) 151/92  Pulse: 99  Resp: 20  Temp: 98.3 F (36.8 C)  SpO2: 97%   Filed Weights   04/28/23 1451  Weight: 271 lb 6.4 oz (123.1 kg)      LABORATORY DATA:  I have reviewed the data as listed    Latest Ref Rng & Units 03/18/2023   12:01 PM 03/12/2022    2:21 PM 05/16/2014    9:04 AM  CMP  Glucose 70 - 99 mg/dL 213  086  578   BUN 6 - 20 mg/dL 9  11  21    Creatinine 0.44 - 1.00 mg/dL 4.69  6.29  5.28   Sodium 135 - 145 mmol/L 138  138  140   Potassium 3.5 - 5.1 mmol/L 4.0  3.6  3.5   Chloride 98 - 111 mmol/L 106  102  109   CO2 22 - 32 mmol/L 24  24  23    Calcium 8.9 - 10.3 mg/dL 9.5  8.9  8.6   Total Protein 6.5 - 8.1 g/dL 7.6  7.6  6.8   Total Bilirubin 0.0 - 1.2 mg/dL 0.6  0.4  0.7   Alkaline Phos 38 - 126 U/L 75  69  55   AST 15 - 41 U/L 13  32  14   ALT 0 - 44 U/L 13  26  25      Lab Results  Component Value Date   WBC 10.0 03/18/2023   HGB 14.4 03/18/2023   HCT 42.6 03/18/2023   MCV 84.0 03/18/2023   PLT 341 03/18/2023   NEUTROABS 7.2 03/18/2023    ASSESSMENT & PLAN:  Malignant neoplasm of upper-inner quadrant of right breast in female, estrogen receptor positive (HCC) 03/12/2023:Screening mammogram detected right breast asymmetry at 1 o'clock position measuring 0.6 cm.  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, Ki-67 5%   Recommendations: 04/01/2023: Right Lumpectomy: Grade 2 IDC 0.8 cm, 1/2 LN Positive, Margins clear: Stage Ia 2. Oncotype DX testing 04/15/2023: Score 19  (distant recurrence at 5 years: 5%) 3. Adjuvant radiation therapy followed by 4. Adjuvant antiestrogen therapy ------------------------------------------------------------------------------------------------------------- I discussed with the patient the results of the surgery and the Oncotype DX testing. There is no indication for adjuvant chemotherapy. Return to clinic after radiation is complete to start antiestrogen therapy.    No orders of the defined types were placed in this encounter.  The patient has a good understanding of the overall plan. she agrees with it. she will call with any problems that may develop before the next visit here. Total time spent: 30 mins including face to face time and time spent for planning, charting and co-ordination of care   Tamsen Meek, MD 04/28/23

## 2023-04-28 NOTE — Progress Notes (Signed)
Nursing interview for Malignant neoplasm of upper-inner quadrant of right breast in female, estrogen receptor positive (HCC) Stage IA (pT1b, pN1(sn), cM0, G2, ER+, PR+, HER2-, Oncotype DX score: 19).   Patient identity verified x2.  Patient reports mild cording in the inferior aspect of the RT arm, worsened w/ exertion. Patient denies any other related issues at this time.  Meaningful use complete.  Vitals- limited via phone Ht 5\' 7"  (1.702 m)   Wt 271 lb (122.9 kg)   LMP 06/01/2011   BMI 42.44 kg/m   This concludes the interaction.  Ruel Favors, LPN

## 2023-04-30 ENCOUNTER — Ambulatory Visit
Admission: RE | Admit: 2023-04-30 | Discharge: 2023-04-30 | Disposition: A | Discharge status: Home / Self Care | Payer: 59 | Source: Ambulatory Visit | Attending: Radiation Oncology | Admitting: Radiation Oncology

## 2023-04-30 ENCOUNTER — Ambulatory Visit: Payer: 59

## 2023-04-30 VITALS — Ht 67.0 in | Wt 271.0 lb

## 2023-04-30 DIAGNOSIS — Z1732 Human epidermal growth factor receptor 2 negative status: Secondary | ICD-10-CM | POA: Insufficient documentation

## 2023-04-30 DIAGNOSIS — Z1721 Progesterone receptor positive status: Secondary | ICD-10-CM | POA: Insufficient documentation

## 2023-04-30 DIAGNOSIS — Z170 Estrogen receptor positive status [ER+]: Secondary | ICD-10-CM | POA: Insufficient documentation

## 2023-04-30 DIAGNOSIS — C50211 Malignant neoplasm of upper-inner quadrant of right female breast: Secondary | ICD-10-CM | POA: Insufficient documentation

## 2023-05-01 ENCOUNTER — Ambulatory Visit: Payer: 59 | Admitting: Radiation Oncology

## 2023-05-04 ENCOUNTER — Ambulatory Visit: Payer: Commercial Managed Care - PPO | Attending: General Surgery | Admitting: Physical Therapy

## 2023-05-04 ENCOUNTER — Ambulatory Visit
Admission: RE | Admit: 2023-05-04 | Discharge: 2023-05-04 | Disposition: A | Discharge status: Home / Self Care | Payer: 59 | Source: Ambulatory Visit | Attending: Radiation Oncology | Admitting: Radiation Oncology

## 2023-05-04 ENCOUNTER — Encounter: Payer: Self-pay | Admitting: *Deleted

## 2023-05-04 ENCOUNTER — Encounter: Payer: Self-pay | Admitting: Physical Therapy

## 2023-05-04 DIAGNOSIS — M25611 Stiffness of right shoulder, not elsewhere classified: Secondary | ICD-10-CM | POA: Diagnosis present

## 2023-05-04 DIAGNOSIS — Z170 Estrogen receptor positive status [ER+]: Secondary | ICD-10-CM

## 2023-05-04 DIAGNOSIS — C50211 Malignant neoplasm of upper-inner quadrant of right female breast: Secondary | ICD-10-CM | POA: Diagnosis present

## 2023-05-04 DIAGNOSIS — Z1732 Human epidermal growth factor receptor 2 negative status: Secondary | ICD-10-CM | POA: Diagnosis not present

## 2023-05-04 DIAGNOSIS — R293 Abnormal posture: Secondary | ICD-10-CM | POA: Insufficient documentation

## 2023-05-04 DIAGNOSIS — Z483 Aftercare following surgery for neoplasm: Secondary | ICD-10-CM | POA: Insufficient documentation

## 2023-05-04 DIAGNOSIS — Z1721 Progesterone receptor positive status: Secondary | ICD-10-CM | POA: Diagnosis not present

## 2023-05-04 NOTE — Therapy (Signed)
 OUTPATIENT PHYSICAL THERAPY BREAST CANCER POST OP FOLLOW UP   Patient Name: Darlene Patterson MRN: [id number] DOB:12/23/1970, 53 y.o., female Today's Date: 05/04/2023  END OF SESSION:  PT End of Session - 05/04/23 1105     Visit Number 2    Number of Visits 10    Date for PT Re-Evaluation 06/01/23    PT Start Time 1100    PT Stop Time 1158    PT Time Calculation (min) 58 min    Activity Tolerance Patient tolerated treatment well    Behavior During Therapy WFL for tasks assessed/performed             Past Medical History:  Diagnosis Date   Anxiety    with anesthesia   Cancer (HCC) 03/2023   right breast IDC   Complication of anesthesia    Great toe pain    cyst left great toe- to be excised 05-24-14- [place]   H/O hiatal hernia    per upper GI 3/13   Heart murmur    early teens- resolved   History of kidney stones    Obesity    Peripheral vascular disease (HCC)    varicose veins- left   PONV (postoperative nausea and vomiting)    'SEVERE PER PT"   Status post gastric banding    Past Surgical History:  Procedure Laterality Date   ABDOMINAL HYSTERECTOMY  3/13   ACHILLES TENDON REPAIR  08, 09   bilateral   BREAST BIOPSY Right 03/12/2023   [place] RT BREAST BX W LOC DEV 1ST LESION IMG BX SPEC US GUIDE 03/12/2023 [facility]   BREAST BIOPSY  03/31/2023   MM RT RADIOACTIVE SEED LOC MAMMO GUIDE 03/31/2023 [facility]   BREAST LUMPECTOMY WITH RADIOACTIVE SEED AND SENTINEL LYMPH NODE BIOPSY Right 04/01/2023   Procedure: RIGHT BREAST SEED GUIDED LUMPECTOMY AND RIGHT AXILLARY SENTINEL NODE BIOPSY;  Surgeon: Emelia Loron, MD;  Location: [facility];  Service: General;  Laterality: Right;  pec block   BREATH TEK H PYLORI  05/21/2011   Procedure: BREATH TEK H PYLORI;  Surgeon: Valarie Merino, MD;  Location: [facility];  Service: General;  Laterality: N/A;   CESAREAN SECTION  89, 93   CHOLECYSTECTOMY  2011   CYSTOSCOPY W/ URETERAL STENT PLACEMENT  Left 05/16/2014   Procedure: CYSTOSCOPY WITH RETROGRADE PYELOGRAM/URETERAL STENT PLACEMENT;  Surgeon: Crist Fat, MD;  Location: [facility];  Service: Urology;  Laterality: Left;   CYSTOSCOPY WITH URETEROSCOPY AND STENT PLACEMENT Left 05/31/2014   Procedure: LEFT URETEROSCOPY AND STENT EXCHANGE, LEFT RETROGRADE;  Surgeon: Crist Fat, MD;  Location: [facility];  Service: Urology;  Laterality: Left;   EYE SURGERY  75,85   repair lazy eye   HIATAL HERNIA REPAIR  10/07/2011   Procedure: LAPAROSCOPIC REPAIR OF HIATAL HERNIA;  Surgeon: Valarie Merino, MD;  Location: [facility];  Service: General;  Laterality: N/A;   LAPAROSCOPIC GASTRIC BANDING  10/07/2011   Procedure: LAPAROSCOPIC GASTRIC BANDING;  Surgeon: Valarie Merino, MD;  Location: [facility];  Service: General;  Laterality: N/A;   VASCULAR SURGERY     left leg- s/p  injections of veins   Patient Active Problem List   Diagnosis Date Noted   Genetic testing 03/30/2023   Malignant neoplasm of upper-inner quadrant of right breast in female, estrogen receptor positive (HCC) 03/17/2023   Lapband APS + [facility] repair July 2013 10/08/2011   Obesity BMI 41 05/01/2011    REFERRING PROVIDER: Dr. Emelia Loron  REFERRING DIAG: Right breast cancer  THERAPY DIAG:  Malignant neoplasm of upper-inner quadrant of right breast in female, estrogen receptor positive (HCC)  Abnormal posture  Aftercare following surgery for neoplasm  Stiffness of right shoulder, not elsewhere classified  Rationale for Evaluation and Treatment: Rehabilitation  ONSET DATE: 04/01/2023  SUBJECTIVE:                                                                                                                                                                                           SUBJECTIVE STATEMENT: Patient reports she underwent a right lumpectomy and sentinel node biopsy on 04/01/2023 with 1 of 2 lymph nodes positive for carcinoma.Her Oncotype score was low so she  will proceed with radiation on 05/11/2023 and anti-estrogen therapy.  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 She underwent a right lumpectomy and sentinel node biopsy on 04/01/2023 with 1 of 2 lymph nodes positive for carcinoma. It is ER/PR positive and HER2 negative with a Ki67 of 5%.   PATIENT GOALS:  Reassess how my recovery is going related to arm function, pain, and swelling.  PAIN:  Are you having pain? Yes: NPRS scale: 5/10 Pain location: right medial arm from wrist to axilla Pain description: tightness Aggravating factors: reaching overhead and internally rotating shoulder Relieving factors: resting  PRECAUTIONS: Recent Surgery, right UE Lymphedema risk  RED FLAGS: None   ACTIVITY LEVEL / LEISURE: She walks 3x/week for 20 min each time   OBJECTIVE:   PATIENT SURVEYS:  QUICK DASH:  Quick Dash - 05/04/23 0001     Open a tight or new jar Mild difficulty    Do heavy household chores (wash walls, wash floors) Mild difficulty    Carry a shopping bag or briefcase No difficulty    Wash your back Mild difficulty    Use a knife to cut food No difficulty    Recreational activities in which you take some force or impact through your arm, shoulder, or hand (golf, hammering, tennis) Mild difficulty    During the past week, to what extent has your arm, shoulder or hand problem interfered with your normal social activities with family, friends, neighbors, or groups? Not at all    During the past week, to what extent has your arm, shoulder or hand problem limited your work or other regular daily activities Slightly    Arm, shoulder, or hand pain. Mild    Tingling (pins and needles) in your arm, shoulder, or hand Mild    Difficulty Sleeping No difficulty    DASH Score 15.91 %  OBSERVATIONS: Very easily palpable axillary cording present from axilla to medial mid upper arm. Negative neural tension test;  incisions healed well with no signs of redness, swelling, or infection.  POSTURE:  Forward head and rounded shoulders posture  LYMPHEDEMA ASSESSMENT:   UPPER EXTREMITY AROM/PROM:   A/PROM RIGHT   eval   RIGHT 05/04/2023  Shoulder extension 45 54  Shoulder flexion 161 149  Shoulder abduction 166 160  Shoulder internal rotation 76 60  Shoulder external rotation 88 90                          (Blank rows = not tested)   A/PROM LEFT   eval  Shoulder extension 48  Shoulder flexion 152  Shoulder abduction 168  Shoulder internal rotation 79  Shoulder external rotation 88                          (Blank rows = not tested)   CERVICAL AROM: All within normal limits   UPPER EXTREMITY STRENGTH: WNL   LYMPHEDEMA ASSESSMENTS (in cm):    LANDMARK RIGHT   eval RIGHT 05/04/2023  10 cm proximal to olecranon process 36.6 35.8  Olecranon process 29.7 29.6  10 cm proximal to ulnar styloid process 25.1 24.6  Just proximal to ulnar styloid process 18.6 18.5  Across hand at thumb web space 20.3 20.7  At base of 2nd digit 7.1 6.9  (Blank rows = not tested)   LANDMARK LEFT   eval LEFT 05/04/2023  10 cm proximal to olecranon process 37 36.3  Olecranon process 29 29.5  10 cm proximal to ulnar styloid process 25.5 25.2  Just proximal to ulnar styloid process 19.2 18.7  Across hand at thumb web space 20.6 19.6  At base of 2nd digit 7.4 7.2  (Blank rows = not tested)  Surgery type/Date: 04/01/2023 right lumpectomy and sentinel node biopsy Number of lymph nodes removed: 2 Current/past treatment (chemo, radiation, hormone therapy): none Other symptoms:  Heaviness/tightness Yes Pain Yes Pitting edema No Infections No Decreased scar mobility Yes Stemmer sign No  PATIENT EDUCATION:  Education details: HEP; closed chain flexion and abduction to gain last degrees of flexion and abduction. Spent time educating her and her partner [name] on scar massage, the pathology and treatment of axillary  cording, and lymphedema risk reduction practices.  Person educated: Patient and Spouse Education method: Medical illustrator Education comprehension: verbalized understanding and returned demonstration  HOME EXERCISE PROGRAM: Reviewed previously given post op HEP.  ASSESSMENT:  CLINICAL IMPRESSION: Patient is doing well s/p right lumpectomy and sentinel node biopsy 04/01/2023. She had 2 axillary lymph nodes removed and 1 was positive for cancer, so her axilla will likely be included with radiation and she will be at moderate risk for lymphedema.She has good shoulder ROM which is nearly back to baseline and her incisions are well healed with no significant scar tissue present. There is palpable axillary cording present contributing to pain and limiting ROM in certain directions (reaching overhead while internally rotating). She will benefit from PT to help her reduce pain and cording and return to baseline functionally.  Pt will benefit from skilled therapeutic intervention to improve on the following deficits: Decreased knowledge of precautions, impaired UE functional use, pain, decreased ROM, postural dysfunction.   PT treatment/interventions: ADL/Self care home management, [phone]- PT Re-evaluation, 97110-Therapeutic exercises, 97530- Therapeutic activity, [id number]- Neuromuscular re-education, [phone]- Self Care, 09811- Manual therapy, Patient/Family education,  and Scar mobilization   GOALS: Goals reviewed with patient? Yes  LONG TERM GOALS:  (STG=LTG)  GOALS Name Target Date  Goal status  1 Pt will demonstrate she has regained full shoulder ROM and function post operatively compared to baselines.  Baseline: 06/01/2023 INITIAL  2 Patient will report >/= 50% less pain with daily tasks in her axilla and medial upper arm. 06/01/2023 INITIAL  3 Patient will improve her DASH score to be </= 8 for improved overall UE function. 06/01/2023 INITIAL  4 Patient will be able to verbalize good  understanding of lymphedema risk reduction practices. 06/01/2023 INITIAL     PLAN:  PT FREQUENCY/DURATION: 2x/week for up to 4 weeks  PLAN FOR NEXT SESSION: Myofascial release and PROM to improve axillary cording   [facility]  [address], [address]  [place] [address]  [phone]  After Breast Cancer Class Video It is recommended you view the ABC class video to be educated on lymphedema risk reduction. This video lasts for about 30 minutes. It can be viewed on our website here: [email or web address]  Scar massage You can begin gentle scar massage to you incision sites. Gently place one hand on the incision and move the skin (without sliding on the skin) in various directions. Do this for a few minutes and then you can gently massage either coconut oil or vitamin E cream into the scars.  Compression garment You should continue wearing your compression bra until you feel like you no longer have swelling.  Home exercise Program Continue doing the exercises you were given until you feel like you can do them without feeling any tightness at the end.   Walking Program Studies show that 30 minutes of walking per day (fast enough to elevate your heart rate) can significantly reduce the risk of a cancer recurrence. If you can't walk due to other medical reasons, we encourage you to find another activity you could do (like a stationary bike or water exercise).  Posture After breast cancer surgery, people frequently sit with rounded shoulders posture because it puts their incisions on slack and feels better. If you sit like this and scar tissue forms in that position, you can become very tight and have pain sitting or standing with good posture. Try to be aware of your posture and sit and stand up tall to heal properly.  Follow up PT: It is recommended you return every 3 months for the first 3 years  following surgery to be assessed on the SOZO machine for an L-Dex score. This helps prevent clinically significant lymphedema in 95% of patients. These follow up screens are 10 minute appointments that you are not billed for.  Bethann Punches, [place] 05/04/23 3:11 PM

## 2023-05-04 NOTE — Patient Instructions (Addendum)
 After Breast Cancer Class Video It is recommended you view the ABC class video to be educated on lymphedema risk reduction. This video lasts for about 30 minutes. It can be viewed on our website here: [email or web address]  Scar massage You can begin gentle scar massage to you incision sites. Gently place one hand on the incision and move the skin (without sliding on the skin) in various directions. Do this for a few minutes and then you can gently massage either coconut oil or vitamin E cream into the scars.  Compression garment You should continue wearing your compression bra until you feel like you no longer have swelling.  Home exercise Program Continue doing the exercises you were given until you feel like you can do them without feeling any tightness at the end.   Walking Program Studies show that 30 minutes of walking per day (fast enough to elevate your heart rate) can significantly reduce the risk of a cancer recurrence. If you can't walk due to other medical reasons, we encourage you to find another activity you could do (like a stationary bike or water exercise).  Posture After breast cancer surgery, people frequently sit with rounded shoulders posture because it puts their incisions on slack and feels better. If you sit like this and scar tissue forms in that position, you can become very tight and have pain sitting or standing with good posture. Try to be aware of your posture and sit and stand up tall to heal properly.  Follow up PT: It is recommended you return every 3 months for the first 3 years following surgery to be assessed on the SOZO machine for an L-Dex score. This helps prevent clinically significant lymphedema in 95% of patients. These follow up screens are 10 minute appointments that you are not billed for.   Axillary web syndrome (also called cording) can happen after having breast cancer surgery when  lymph nodes in the armpit are removed. It presents as if you have a thin cord in your arm and can run from the armpit all the way down into the forearm. If you've had a sentinel node biopsy, the risk is 1-20% and if you've had an axillary lymph node dissection (more than 7 nodes removed), the risk is 36-72%. The ranges vary depending on the research study.  It most often happens 3-4 weeks post-op but can happen sooner or later. There are several possibilities for what cording actually is. Although no one knows for sure as of yet, it may be related to lymphatics, veins, or other tissue. Sometimes cording resolves on its own but other times it requires physical therapy with a therapist who specializes in lymphedema and/or cancer rehab. Treatment typically involves stretching, manual techniques, and exercise. Sometimes cords get "released" while stretching or during manual treatment and the patient may experience the sensation of a "pop." This may feel strange but it is not dangerous and is a sign that the cord has released; range of motion may be improved in the process.  Closed Chain: Shoulder Flexion / Extension - on Wall    Hands on wall, step backward. Return. Stepping causes shoulder flexion and extension Do _5__ times, holding 5 seconds,  _2__ times per day.  http://ss.exer.us/265   Copyright  VHI. All rights reserved.   Closed Chain: Shoulder Abduction / Adduction - on Wall    One hand on wall, step to side and return. Stepping causes shoulder to abduct and adduct. Step __5_ times, holding 5 seconds, _2__ times  per day.  http://ss.exer.us/267   Copyright  VHI. All rights reserved.

## 2023-05-07 ENCOUNTER — Ambulatory Visit: Payer: Commercial Managed Care - PPO

## 2023-05-07 DIAGNOSIS — Z483 Aftercare following surgery for neoplasm: Secondary | ICD-10-CM

## 2023-05-07 DIAGNOSIS — R293 Abnormal posture: Secondary | ICD-10-CM

## 2023-05-07 DIAGNOSIS — C50211 Malignant neoplasm of upper-inner quadrant of right female breast: Secondary | ICD-10-CM

## 2023-05-07 DIAGNOSIS — M25611 Stiffness of right shoulder, not elsewhere classified: Secondary | ICD-10-CM

## 2023-05-07 NOTE — Therapy (Signed)
 OUTPATIENT PHYSICAL THERAPY BREAST CANCER POST OP TREATMENT   Patient Name: Darlene Patterson MRN: [id number] DOB:August 11, 1970, 53 y.o., female Today's Date: 05/07/2023  END OF SESSION:  PT End of Session - 05/07/23 1103     Visit Number 3    Number of Visits 10    Date for PT Re-Evaluation 06/01/23    PT Start Time 1103    PT Stop Time 1157    PT Time Calculation (min) 54 min    Activity Tolerance Patient tolerated treatment well    Behavior During Therapy [facility] for tasks assessed/performed             Past Medical History:  Diagnosis Date   Anxiety    with anesthesia   Cancer (HCC) 03/2023   right breast IDC   Complication of anesthesia    Great toe pain    cyst left great toe- to be excised 05-24-14- [place]   H/O hiatal hernia    per upper GI 3/13   Heart murmur    early teens- resolved   History of kidney stones    Obesity    Peripheral vascular disease (HCC)    varicose veins- left   PONV (postoperative nausea and vomiting)    'SEVERE PER PT"   Status post gastric banding    Past Surgical History:  Procedure Laterality Date   ABDOMINAL HYSTERECTOMY  3/13   ACHILLES TENDON REPAIR  08, 09   bilateral   BREAST BIOPSY Right 03/12/2023   [place] RT BREAST BX W LOC DEV 1ST LESION IMG BX SPEC US GUIDE 03/12/2023 [facility]   BREAST BIOPSY  03/31/2023   MM RT RADIOACTIVE SEED LOC MAMMO GUIDE 03/31/2023 [facility]   BREAST LUMPECTOMY WITH RADIOACTIVE SEED AND SENTINEL LYMPH NODE BIOPSY Right 04/01/2023   Procedure: RIGHT BREAST SEED GUIDED LUMPECTOMY AND RIGHT AXILLARY SENTINEL NODE BIOPSY;  Surgeon: Emelia Loron, MD;  Location: [facility];  Service: General;  Laterality: Right;  pec block   BREATH TEK H PYLORI  05/21/2011   Procedure: BREATH TEK H PYLORI;  Surgeon: Valarie Merino, MD;  Location: [facility];  Service: General;  Laterality: N/A;   CESAREAN SECTION  89, 93   CHOLECYSTECTOMY  2011   CYSTOSCOPY W/ URETERAL STENT PLACEMENT  Left 05/16/2014   Procedure: CYSTOSCOPY WITH RETROGRADE PYELOGRAM/URETERAL STENT PLACEMENT;  Surgeon: Crist Fat, MD;  Location: [facility];  Service: Urology;  Laterality: Left;   CYSTOSCOPY WITH URETEROSCOPY AND STENT PLACEMENT Left 05/31/2014   Procedure: LEFT URETEROSCOPY AND STENT EXCHANGE, LEFT RETROGRADE;  Surgeon: Crist Fat, MD;  Location: [facility];  Service: Urology;  Laterality: Left;   EYE SURGERY  75,85   repair lazy eye   HIATAL HERNIA REPAIR  10/07/2011   Procedure: LAPAROSCOPIC REPAIR OF HIATAL HERNIA;  Surgeon: Valarie Merino, MD;  Location: [facility];  Service: General;  Laterality: N/A;   LAPAROSCOPIC GASTRIC BANDING  10/07/2011   Procedure: LAPAROSCOPIC GASTRIC BANDING;  Surgeon: Valarie Merino, MD;  Location: [facility];  Service: General;  Laterality: N/A;   VASCULAR SURGERY     left leg- s/p  injections of veins   Patient Active Problem List   Diagnosis Date Noted   Genetic testing 03/30/2023   Malignant neoplasm of upper-inner quadrant of right breast in female, estrogen receptor positive (HCC) 03/17/2023   Lapband APS + [facility] repair July 2013 10/08/2011   Obesity BMI 41 05/01/2011    REFERRING PROVIDER: Dr. Emelia Loron  REFERRING  DIAG: Right breast cancer  THERAPY DIAG:  Malignant neoplasm of upper-inner quadrant of right breast in female, estrogen receptor positive (HCC)  Abnormal posture  Aftercare following surgery for neoplasm  Stiffness of right shoulder, not elsewhere classified  Rationale for Evaluation and Treatment: Rehabilitation  ONSET DATE: 04/01/2023  SUBJECTIVE:                                                                                                                                                                                           SUBJECTIVE STATEMENT Working on exercises at home.  EVAL: Patient reports she underwent a right lumpectomy and sentinel node biopsy on 04/01/2023 with 1 of 2 lymph nodes positive for  carcinoma.Her Oncotype score was low so she will proceed with radiation on 05/11/2023 and anti-estrogen therapy.  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 She underwent a right lumpectomy and sentinel node biopsy on 04/01/2023 with 1 of 2 lymph nodes positive for carcinoma. It is ER/PR positive and HER2 negative with a Ki67 of 5%.   PATIENT GOALS:  Reassess how my recovery is going related to arm function, pain, and swelling.  PAIN:  Are you having pain? Yes: NPRS scale: 0 -4/10 Pain location: right medial arm from wrist to axilla Pain description: tightness Aggravating factors: reaching overhead and internally rotating shoulder Relieving factors: resting  PRECAUTIONS: Recent Surgery, right UE Lymphedema risk  RED FLAGS: None   ACTIVITY LEVEL / LEISURE: She walks 3x/week for 20 min each time   OBJECTIVE:   PATIENT SURVEYS:  QUICK DASH:    OBSERVATIONS: Very easily palpable axillary cording present from axilla to medial mid upper arm. Negative neural tension test; incisions healed well with no signs of redness, swelling, or infection.  POSTURE:  Forward head and rounded shoulders posture  LYMPHEDEMA ASSESSMENT:   UPPER EXTREMITY AROM/PROM:   A/PROM RIGHT   eval   RIGHT 05/04/2023  Shoulder extension 45 54  Shoulder flexion 161 149  Shoulder abduction 166 160  Shoulder internal rotation 76 60  Shoulder external rotation 88 90                          (Blank rows = not tested)   A/PROM LEFT   eval  Shoulder extension 48  Shoulder flexion 152  Shoulder abduction 168  Shoulder internal rotation 79  Shoulder external rotation 88                          (Blank rows = not tested)  CERVICAL AROM: All within normal limits   UPPER EXTREMITY STRENGTH: WNL   LYMPHEDEMA ASSESSMENTS (in cm):    LANDMARK RIGHT   eval RIGHT 05/04/2023  10 cm proximal to olecranon process 36.6 35.8  Olecranon process 29.7  29.6  10 cm proximal to ulnar styloid process 25.1 24.6  Just proximal to ulnar styloid process 18.6 18.5  Across hand at thumb web space 20.3 20.7  At base of 2nd digit 7.1 6.9  (Blank rows = not tested)   LANDMARK LEFT   eval LEFT 05/04/2023  10 cm proximal to olecranon process 37 36.3  Olecranon process 29 29.5  10 cm proximal to ulnar styloid process 25.5 25.2  Just proximal to ulnar styloid process 19.2 18.7  Across hand at thumb web space 20.6 19.6  At base of 2nd digit 7.4 7.2  (Blank rows = not tested)  Surgery type/Date: 04/01/2023 right lumpectomy and sentinel node biopsy Number of lymph nodes removed: 2 Current/past treatment (chemo, radiation, hormone therapy): none Other symptoms:  Heaviness/tightness Yes Pain Yes Pitting edema No Infections No Decreased scar mobility Yes Stemmer sign No   TREATMENT TODAY 05/07/2023 Pulleys x 2 min flex, scaption, abduction with VC's to depress scapula Ball rolls on wall x 10 forward and ab x 5 Wall stretches in varying positions to stretch cording with UE ext overhead and wrist extension oscillations MFR to axilla, upper arm and forearm cording PROM right shoulder flex, scaption, abduction, ER    PATIENT EDUCATION:  Education details: HEP; closed chain flexion and abduction to gain last degrees of flexion and abduction. Spent time educating her and her partner [name] on scar massage, the pathology and treatment of axillary cording, and lymphedema risk reduction practices.  Person educated: Patient and Spouse Education method: Medical illustrator Education comprehension: verbalized understanding and returned demonstration  HOME EXERCISE PROGRAM: Reviewed previously given post op HEP.  ASSESSMENT:  CLINICAL IMPRESSION: Pt liked the pulleys and may consider purchasing. Cording palpable  throughout with arm in stretched position but pt with very good PROM.  Pt will benefit from skilled therapeutic intervention to  improve on the following deficits: Decreased knowledge of precautions, impaired UE functional use, pain, decreased ROM, postural dysfunction.   PT treatment/interventions: ADL/Self care home management, [phone]- PT Re-evaluation, 97110-Therapeutic exercises, 97530- Therapeutic activity, [id number]- Neuromuscular re-education, [phone]- Self Care, 91478- Manual therapy, Patient/Family education, and Scar mobilization   GOALS: Goals reviewed with patient? Yes  LONG TERM GOALS:  (STG=LTG)  GOALS Name Target Date  Goal status  1 Pt will demonstrate she has regained full shoulder ROM and function post operatively compared to baselines.  Baseline: 06/01/2023 INITIAL  2 Patient will report >/= 50% less pain with daily tasks in her axilla and medial upper arm. 06/01/2023 INITIAL  3 Patient will improve her DASH score to be </= 8 for improved overall UE function. 06/01/2023 INITIAL  4 Patient will be able to verbalize good understanding of lymphedema risk reduction practices. 06/01/2023 INITIAL     PLAN:  PT FREQUENCY/DURATION: 2x/week for up to 4 weeks  PLAN FOR NEXT SESSION: Myofascial release and PROM to improve axillary cording   [facility]  [address]  [place] [address]  [phone]  After Breast Cancer Class Video It is recommended you view the ABC class video to be educated on lymphedema risk reduction. This video lasts for about 30 minutes. It can be viewed on our website here: [email or web address]  Scar massage You can begin  gentle scar massage to you incision sites. Gently place one hand on the incision and move the skin (without sliding on the skin) in various directions. Do this for a few minutes and then you can gently massage either coconut oil or vitamin E cream into the scars.  Compression garment You should continue wearing your compression bra until you feel like you no longer  have swelling.  Home exercise Program Continue doing the exercises you were given until you feel like you can do them without feeling any tightness at the end.   Walking Program Studies show that 30 minutes of walking per day (fast enough to elevate your heart rate) can significantly reduce the risk of a cancer recurrence. If you can't walk due to other medical reasons, we encourage you to find another activity you could do (like a stationary bike or water exercise).  Posture After breast cancer surgery, people frequently sit with rounded shoulders posture because it puts their incisions on slack and feels better. If you sit like this and scar tissue forms in that position, you can become very tight and have pain sitting or standing with good posture. Try to be aware of your posture and sit and stand up tall to heal properly.  Follow up PT: It is recommended you return every 3 months for the first 3 years following surgery to be assessed on the SOZO machine for an L-Dex score. This helps prevent clinically significant lymphedema in 95% of patients. These follow up screens are 10 minute appointments that you are not billed for.  Bethann Punches, [place] 05/07/23 12:01 PM

## 2023-05-08 DIAGNOSIS — C50211 Malignant neoplasm of upper-inner quadrant of right female breast: Secondary | ICD-10-CM | POA: Diagnosis not present

## 2023-05-11 ENCOUNTER — Ambulatory Visit
Admission: RE | Admit: 2023-05-11 | Discharge: 2023-05-11 | Disposition: A | Discharge status: Home / Self Care | Payer: 59 | Source: Ambulatory Visit | Attending: Radiation Oncology | Admitting: Radiation Oncology

## 2023-05-11 ENCOUNTER — Other Ambulatory Visit: Payer: Self-pay

## 2023-05-11 DIAGNOSIS — C50211 Malignant neoplasm of upper-inner quadrant of right female breast: Secondary | ICD-10-CM | POA: Diagnosis present

## 2023-05-11 DIAGNOSIS — Z170 Estrogen receptor positive status [ER+]: Secondary | ICD-10-CM | POA: Diagnosis present

## 2023-05-11 LAB — RAD ONC ARIA SESSION SUMMARY

## 2023-05-12 ENCOUNTER — Other Ambulatory Visit: Payer: Self-pay

## 2023-05-12 ENCOUNTER — Ambulatory Visit
Admission: RE | Admit: 2023-05-12 | Discharge: 2023-05-12 | Disposition: A | Discharge status: Home / Self Care | Payer: 59 | Source: Ambulatory Visit | Attending: Radiation Oncology | Admitting: Radiation Oncology

## 2023-05-12 DIAGNOSIS — C50211 Malignant neoplasm of upper-inner quadrant of right female breast: Secondary | ICD-10-CM | POA: Diagnosis not present

## 2023-05-12 LAB — RAD ONC ARIA SESSION SUMMARY

## 2023-05-13 ENCOUNTER — Ambulatory Visit: Payer: Commercial Managed Care - PPO | Attending: General Surgery

## 2023-05-13 ENCOUNTER — Other Ambulatory Visit: Payer: Self-pay

## 2023-05-13 ENCOUNTER — Ambulatory Visit
Admission: RE | Admit: 2023-05-13 | Discharge: 2023-05-13 | Disposition: A | Discharge status: Home / Self Care | Payer: 59 | Source: Ambulatory Visit | Attending: Radiation Oncology | Admitting: Radiation Oncology

## 2023-05-13 ENCOUNTER — Telehealth: Payer: Self-pay | Admitting: Hematology and Oncology

## 2023-05-13 DIAGNOSIS — R293 Abnormal posture: Secondary | ICD-10-CM | POA: Diagnosis present

## 2023-05-13 DIAGNOSIS — C50211 Malignant neoplasm of upper-inner quadrant of right female breast: Secondary | ICD-10-CM | POA: Insufficient documentation

## 2023-05-13 DIAGNOSIS — Z483 Aftercare following surgery for neoplasm: Secondary | ICD-10-CM | POA: Diagnosis present

## 2023-05-13 DIAGNOSIS — M25611 Stiffness of right shoulder, not elsewhere classified: Secondary | ICD-10-CM | POA: Insufficient documentation

## 2023-05-13 DIAGNOSIS — Z170 Estrogen receptor positive status [ER+]: Secondary | ICD-10-CM | POA: Insufficient documentation

## 2023-05-13 LAB — RAD ONC ARIA SESSION SUMMARY

## 2023-05-13 NOTE — Telephone Encounter (Signed)
 Patient is aware of scheduled appointment times/dates

## 2023-05-13 NOTE — Therapy (Signed)
 OUTPATIENT PHYSICAL THERAPY BREAST CANCER TREATMENT   Patient Name: Darlene Patterson MRN: [id number] DOB:Mar 31, 1970, 53 y.o., female Today's Date: 05/13/2023  END OF SESSION:  PT End of Session - 05/13/23 1510     Visit Number 4    Number of Visits 10    Date for PT Re-Evaluation 06/01/23    PT Start Time 1507    PT Stop Time 1603    PT Time Calculation (min) 56 min    Activity Tolerance Patient tolerated treatment well    Behavior During Therapy [facility] for tasks assessed/performed             Past Medical History:  Diagnosis Date   Anxiety    with anesthesia   Cancer (HCC) 03/2023   right breast IDC   Complication of anesthesia    Great toe pain    cyst left great toe- to be excised 05-24-14- [place]   H/O hiatal hernia    per upper GI 3/13   Heart murmur    early teens- resolved   History of kidney stones    Obesity    Peripheral vascular disease (HCC)    varicose veins- left   PONV (postoperative nausea and vomiting)    'SEVERE PER PT"   Status post gastric banding    Past Surgical History:  Procedure Laterality Date   ABDOMINAL HYSTERECTOMY  3/13   ACHILLES TENDON REPAIR  08, 09   bilateral   BREAST BIOPSY Right 03/12/2023   [place] RT BREAST BX W LOC DEV 1ST LESION IMG BX SPEC US GUIDE 03/12/2023 [facility]   BREAST BIOPSY  03/31/2023   MM RT RADIOACTIVE SEED LOC MAMMO GUIDE 03/31/2023 [facility]   BREAST LUMPECTOMY WITH RADIOACTIVE SEED AND SENTINEL LYMPH NODE BIOPSY Right 04/01/2023   Procedure: RIGHT BREAST SEED GUIDED LUMPECTOMY AND RIGHT AXILLARY SENTINEL NODE BIOPSY;  Surgeon: Emelia Loron, MD;  Location: [facility];  Service: General;  Laterality: Right;  pec block   BREATH TEK H PYLORI  05/21/2011   Procedure: BREATH TEK H PYLORI;  Surgeon: Valarie Merino, MD;  Location: [facility];  Service: General;  Laterality: N/A;   CESAREAN SECTION  89, 93   CHOLECYSTECTOMY  2011   CYSTOSCOPY W/ URETERAL STENT PLACEMENT Left  05/16/2014   Procedure: CYSTOSCOPY WITH RETROGRADE PYELOGRAM/URETERAL STENT PLACEMENT;  Surgeon: Crist Fat, MD;  Location: [facility];  Service: Urology;  Laterality: Left;   CYSTOSCOPY WITH URETEROSCOPY AND STENT PLACEMENT Left 05/31/2014   Procedure: LEFT URETEROSCOPY AND STENT EXCHANGE, LEFT RETROGRADE;  Surgeon: Crist Fat, MD;  Location: [facility];  Service: Urology;  Laterality: Left;   EYE SURGERY  75,85   repair lazy eye   HIATAL HERNIA REPAIR  10/07/2011   Procedure: LAPAROSCOPIC REPAIR OF HIATAL HERNIA;  Surgeon: Valarie Merino, MD;  Location: [facility];  Service: General;  Laterality: N/A;   LAPAROSCOPIC GASTRIC BANDING  10/07/2011   Procedure: LAPAROSCOPIC GASTRIC BANDING;  Surgeon: Valarie Merino, MD;  Location: [facility];  Service: General;  Laterality: N/A;   VASCULAR SURGERY     left leg- s/p  injections of veins   Patient Active Problem List   Diagnosis Date Noted   Genetic testing 03/30/2023   Malignant neoplasm of upper-inner quadrant of right breast in female, estrogen receptor positive (HCC) 03/17/2023   Lapband APS + [facility] repair July 2013 10/08/2011   Obesity BMI 41 05/01/2011    REFERRING PROVIDER: Dr. Emelia Loron  REFERRING DIAG: Right  breast cancer  THERAPY DIAG:  Malignant neoplasm of upper-inner quadrant of right breast in female, estrogen receptor positive (HCC)  Abnormal posture  Aftercare following surgery for neoplasm  Stiffness of right shoulder, not elsewhere classified  Rationale for Evaluation and Treatment: Rehabilitation  ONSET DATE: 04/01/2023  SUBJECTIVE:                                                                                                                                                                                           SUBJECTIVE STATEMENT I'm working on stretching at home and I watched the ABC video. I fell walking out of [employer] today. I just stepped off the curb not paying attention. I think I'm ok.      EVAL: Patient reports she underwent a right lumpectomy and sentinel node biopsy on 04/01/2023 with 1 of 2 lymph nodes positive for carcinoma.Her Oncotype score was low so she will proceed with radiation on 05/11/2023 and anti-estrogen therapy.  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 She underwent a right lumpectomy and sentinel node biopsy on 04/01/2023 with 1 of 2 lymph nodes positive for carcinoma. It is ER/PR positive and HER2 negative with a Ki67 of 5%.   PATIENT GOALS:  Reassess how my recovery is going related to arm function, pain, and swelling.  PAIN:  Are you having pain? Yes: NPRS scale: 0 -4/10 Pain location: right medial arm from wrist to axilla Pain description: tightness Aggravating factors: reaching overhead and internally rotating shoulder Relieving factors: resting  PRECAUTIONS: Recent Surgery, right UE Lymphedema risk  RED FLAGS: None   ACTIVITY LEVEL / LEISURE: She walks 3x/week for 20 min each time   OBJECTIVE:   PATIENT SURVEYS:  QUICK DASH:    OBSERVATIONS: Very easily palpable axillary cording present from axilla to medial mid upper arm. Negative neural tension test; incisions healed well with no signs of redness, swelling, or infection.  POSTURE:  Forward head and rounded shoulders posture  LYMPHEDEMA ASSESSMENT:   UPPER EXTREMITY AROM/PROM:   A/PROM RIGHT   eval   RIGHT 05/04/2023  Shoulder extension 45 54  Shoulder flexion 161 149  Shoulder abduction 166 160  Shoulder internal rotation 76 60  Shoulder external rotation 88 90                          (Blank rows = not tested)   A/PROM LEFT   eval  Shoulder extension 48  Shoulder flexion 152  Shoulder abduction 168  Shoulder internal rotation 79  Shoulder external rotation 88                          (  Blank rows = not tested)   CERVICAL AROM: All within normal limits   UPPER EXTREMITY STRENGTH: WNL   LYMPHEDEMA  ASSESSMENTS (in cm):    LANDMARK RIGHT   eval RIGHT 05/04/2023  10 cm proximal to olecranon process 36.6 35.8  Olecranon process 29.7 29.6  10 cm proximal to ulnar styloid process 25.1 24.6  Just proximal to ulnar styloid process 18.6 18.5  Across hand at thumb web space 20.3 20.7  At base of 2nd digit 7.1 6.9  (Blank rows = not tested)   LANDMARK LEFT   eval LEFT 05/04/2023  10 cm proximal to olecranon process 37 36.3  Olecranon process 29 29.5  10 cm proximal to ulnar styloid process 25.5 25.2  Just proximal to ulnar styloid process 19.2 18.7  Across hand at thumb web space 20.6 19.6  At base of 2nd digit 7.4 7.2  (Blank rows = not tested)  Surgery type/Date: 04/01/2023 right lumpectomy and sentinel node biopsy Number of lymph nodes removed: 2 Current/past treatment (chemo, radiation, hormone therapy): none Other symptoms:  Heaviness/tightness Yes Pain Yes Pitting edema No Infections No Decreased scar mobility Yes Stemmer sign No   TREATMENT TODAY 05/13/23: Self Care Noted increased edema at medial aspect of breast with mild fibrosis and peau d'orange so included MLD to medial breast today with stretching. Instructed pt in basics of anatomy of lymphatic system and principles of MLD.  Manual Therapy MLD to Rt breast as follows: Short neck, superficial and deep abdominals, Lt axilla and Rt inguinal nodes, anterior inter-axillary anastomosis, Rt axillo-inguinal anastomosis, then Rt breast, mostly focusing on medial breast where peau d'orange present and mild fibrosis palpated, then briefly into Lt S/L for focus to lateral breast where some fullness palpated, then finished retracing steps in supine.  Made a small chip pack for pt to wear at medial breast in her compression bra P/ROM of Rt shoulder into flex and abd  05/07/2023 Pulleys x 2 min flex, scaption, abduction with VC's to depress scapula Ball rolls on wall x 10 forward and ab x 5 Wall stretches in varying positions to  stretch cording with UE ext overhead and wrist extension oscillations MFR to axilla, upper arm and forearm cording PROM right shoulder flex, scaption, abduction, ER    PATIENT EDUCATION:  Education details: HEP; closed chain flexion and abduction to gain last degrees of flexion and abduction. Spent time educating her and her partner [name] on scar massage, the pathology and treatment of axillary cording, and lymphedema risk reduction practices.  Person educated: Patient and Spouse Education method: Medical illustrator Education comprehension: verbalized understanding and returned demonstration  HOME EXERCISE PROGRAM: Reviewed previously given post op HEP.  ASSESSMENT:  CLINICAL IMPRESSION: Pt is working on stretching at home. Today noted some mild fibrosis with peau d'orange at medial breast so included education about this with MLD. Also issued chip pack she can wear at medial breast with her compression bras. Also continued with manual therapy working to decrease Rt upper quadrant tightness.   Pt will benefit from skilled therapeutic intervention to improve on the following deficits: Decreased knowledge of precautions, impaired UE functional use, pain, decreased ROM, postural dysfunction.   PT treatment/interventions: ADL/Self care home management, [phone]- PT Re-evaluation, 97110-Therapeutic exercises, 97530- Therapeutic activity, [id number]- Neuromuscular re-education, [phone]- Self Care, 32440- Manual therapy, Patient/Family education, and Scar mobilization   GOALS: Goals reviewed with patient? Yes  LONG TERM GOALS:  (STG=LTG)  GOALS Name Target Date  Goal status  1 Pt will  demonstrate she has regained full shoulder ROM and function post operatively compared to baselines.  Baseline: 06/01/2023 INITIAL  2 Patient will report >/= 50% less pain with daily tasks in her axilla and medial upper arm. 06/01/2023 INITIAL  3 Patient will improve her DASH score to be </= 8 for improved  overall UE function. 06/01/2023 INITIAL  4 Patient will be able to verbalize good understanding of lymphedema risk reduction practices. 06/01/2023 INITIAL     PLAN:  PT FREQUENCY/DURATION: 2x/week for up to 4 weeks  PLAN FOR NEXT SESSION: Myofascial release and PROM to improve axillary cording; how is chip pack?    [facility]  [address]  [place] [address]  [phone]

## 2023-05-14 ENCOUNTER — Ambulatory Visit
Admission: RE | Admit: 2023-05-14 | Discharge: 2023-05-14 | Disposition: A | Discharge status: Home / Self Care | Payer: 59 | Source: Ambulatory Visit | Attending: Radiation Oncology | Admitting: Radiation Oncology

## 2023-05-14 ENCOUNTER — Other Ambulatory Visit: Payer: Self-pay

## 2023-05-14 DIAGNOSIS — C50211 Malignant neoplasm of upper-inner quadrant of right female breast: Secondary | ICD-10-CM | POA: Diagnosis not present

## 2023-05-14 LAB — RAD ONC ARIA SESSION SUMMARY

## 2023-05-15 ENCOUNTER — Ambulatory Visit: Payer: Commercial Managed Care - PPO

## 2023-05-15 ENCOUNTER — Ambulatory Visit: Payer: 59

## 2023-05-15 ENCOUNTER — Ambulatory Visit

## 2023-05-15 DIAGNOSIS — R293 Abnormal posture: Secondary | ICD-10-CM

## 2023-05-15 DIAGNOSIS — Z483 Aftercare following surgery for neoplasm: Secondary | ICD-10-CM

## 2023-05-15 DIAGNOSIS — C50211 Malignant neoplasm of upper-inner quadrant of right female breast: Secondary | ICD-10-CM

## 2023-05-15 DIAGNOSIS — M25611 Stiffness of right shoulder, not elsewhere classified: Secondary | ICD-10-CM

## 2023-05-15 NOTE — Therapy (Signed)
 OUTPATIENT PHYSICAL THERAPY BREAST CANCER TREATMENT   Patient Name: Darlene Patterson MRN: [id number] DOB:1971-01-27, 53 y.o., female Today's Date: 05/15/2023  END OF SESSION:  PT End of Session - 05/15/23 0906     Visit Number 5    Number of Visits 10    Date for PT Re-Evaluation 06/01/23    PT Start Time 0903    PT Stop Time 1000    PT Time Calculation (min) 57 min    Activity Tolerance Patient tolerated treatment well    Behavior During Therapy [facility] for tasks assessed/performed             Past Medical History:  Diagnosis Date   Anxiety    with anesthesia   Cancer (HCC) 03/2023   right breast IDC   Complication of anesthesia    Great toe pain    cyst left great toe- to be excised 3-16-16Arnetha Massy   H/O hiatal hernia    per upper GI 3/13   Heart murmur    early teens- resolved   History of kidney stones    Obesity    Peripheral vascular disease (HCC)    varicose veins- left   PONV (postoperative nausea and vomiting)    'SEVERE PER PT"   Status post gastric banding    Past Surgical History:  Procedure Laterality Date   ABDOMINAL HYSTERECTOMY  3/13   ACHILLES TENDON REPAIR  08, 09   bilateral   BREAST BIOPSY Right 03/12/2023   [place] RT BREAST BX W LOC DEV 1ST LESION IMG BX SPEC US GUIDE 03/12/2023 [facility]   BREAST BIOPSY  03/31/2023   MM RT RADIOACTIVE SEED LOC MAMMO GUIDE 03/31/2023 [facility]   BREAST LUMPECTOMY WITH RADIOACTIVE SEED AND SENTINEL LYMPH NODE BIOPSY Right 04/01/2023   Procedure: RIGHT BREAST SEED GUIDED LUMPECTOMY AND RIGHT AXILLARY SENTINEL NODE BIOPSY;  Surgeon: Emelia Loron, MD;  Location: [facility];  Service: General;  Laterality: Right;  pec block   BREATH TEK H PYLORI  05/21/2011   Procedure: BREATH TEK H PYLORI;  Surgeon: Valarie Merino, MD;  Location: [facility];  Service: General;  Laterality: N/A;   CESAREAN SECTION  89, 93   CHOLECYSTECTOMY  2011   CYSTOSCOPY W/ URETERAL STENT PLACEMENT Left  05/16/2014   Procedure: CYSTOSCOPY WITH RETROGRADE PYELOGRAM/URETERAL STENT PLACEMENT;  Surgeon: Crist Fat, MD;  Location: [facility];  Service: Urology;  Laterality: Left;   CYSTOSCOPY WITH URETEROSCOPY AND STENT PLACEMENT Left 05/31/2014   Procedure: LEFT URETEROSCOPY AND STENT EXCHANGE, LEFT RETROGRADE;  Surgeon: Crist Fat, MD;  Location: [facility];  Service: Urology;  Laterality: Left;   EYE SURGERY  75,85   repair lazy eye   HIATAL HERNIA REPAIR  10/07/2011   Procedure: LAPAROSCOPIC REPAIR OF HIATAL HERNIA;  Surgeon: Valarie Merino, MD;  Location: [facility];  Service: General;  Laterality: N/A;   LAPAROSCOPIC GASTRIC BANDING  10/07/2011   Procedure: LAPAROSCOPIC GASTRIC BANDING;  Surgeon: Valarie Merino, MD;  Location: [facility];  Service: General;  Laterality: N/A;   VASCULAR SURGERY     left leg- s/p  injections of veins   Patient Active Problem List   Diagnosis Date Noted   Genetic testing 03/30/2023   Malignant neoplasm of upper-inner quadrant of right breast in female, estrogen receptor positive (HCC) 03/17/2023   Lapband APS + [facility] repair July 2013 10/08/2011   Obesity BMI 41 05/01/2011    REFERRING PROVIDER: Dr. Emelia Loron  REFERRING DIAG: Right  breast cancer  THERAPY DIAG:  Malignant neoplasm of upper-inner quadrant of right breast in female, estrogen receptor positive (HCC)  Abnormal posture  Aftercare following surgery for neoplasm  Stiffness of right shoulder, not elsewhere classified  Rationale for Evaluation and Treatment: Rehabilitation  ONSET DATE: 04/01/2023  SUBJECTIVE:                                                                                                                                                                                           SUBJECTIVE STATEMENT The cording is a lot better today. When I was stretching last night I couldn't feel it pulling as much.   EVAL: Patient reports she underwent a right lumpectomy and  sentinel node biopsy on 04/01/2023 with 1 of 2 lymph nodes positive for carcinoma.Her Oncotype score was low so she will proceed with radiation on 05/11/2023 and anti-estrogen therapy.  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 She underwent a right lumpectomy and sentinel node biopsy on 04/01/2023 with 1 of 2 lymph nodes positive for carcinoma. It is ER/PR positive and HER2 negative with a Ki67 of 5%.   PATIENT GOALS:  Reassess how my recovery is going related to arm function, pain, and swelling.  PAIN:  Are you having pain? No  PRECAUTIONS: Recent Surgery, right UE Lymphedema risk  RED FLAGS: None   ACTIVITY LEVEL / LEISURE: She walks 3x/week for 20 min each time   OBJECTIVE:   PATIENT SURVEYS:  QUICK DASH:    OBSERVATIONS: Very easily palpable axillary cording present from axilla to medial mid upper arm. Negative neural tension test; incisions healed well with no signs of redness, swelling, or infection.  POSTURE:  Forward head and rounded shoulders posture  LYMPHEDEMA ASSESSMENT:   UPPER EXTREMITY AROM/PROM:   A/PROM RIGHT   eval   RIGHT 05/04/2023  Shoulder extension 45 54  Shoulder flexion 161 149  Shoulder abduction 166 160  Shoulder internal rotation 76 60  Shoulder external rotation 88 90                          (Blank rows = not tested)   A/PROM LEFT   eval  Shoulder extension 48  Shoulder flexion 152  Shoulder abduction 168  Shoulder internal rotation 79  Shoulder external rotation 88                          (Blank rows = not tested)   CERVICAL AROM: All within normal limits   UPPER EXTREMITY STRENGTH: WNL  LYMPHEDEMA ASSESSMENTS (in cm):    LANDMARK RIGHT   eval RIGHT 05/04/2023  10 cm proximal to olecranon process 36.6 35.8  Olecranon process 29.7 29.6  10 cm proximal to ulnar styloid process 25.1 24.6  Just proximal to ulnar styloid process 18.6 18.5  Across hand at thumb web  space 20.3 20.7  At base of 2nd digit 7.1 6.9  (Blank rows = not tested)   LANDMARK LEFT   eval LEFT 05/04/2023  10 cm proximal to olecranon process 37 36.3  Olecranon process 29 29.5  10 cm proximal to ulnar styloid process 25.5 25.2  Just proximal to ulnar styloid process 19.2 18.7  Across hand at thumb web space 20.6 19.6  At base of 2nd digit 7.4 7.2  (Blank rows = not tested)  Surgery type/Date: 04/01/2023 right lumpectomy and sentinel node biopsy Number of lymph nodes removed: 2 Current/past treatment (chemo, radiation, hormone therapy): none Other symptoms:  Heaviness/tightness Yes Pain Yes Pitting edema No Infections No Decreased scar mobility Yes Stemmer sign No   TREATMENT TODAY 05/15/23: Therapeutic Exercises Pulleys into flex and abd x 2 mins each with demo and VC's to decrease Rt scapular compensation Roll yellow ball up wall into flex, and Rt abd x 10 each Supine over half foam roll for following: Bil UE horz abd and then bil UE scaption into a "V" x 10 each, then bil UE abd into a "snow angel" with 5 sec holds, x 10 returning therapist demo Manual Therapy MLD to Rt breast as follows: Short neck, 5 diaphragmatic breaths, Lt axilla and Rt inguinal nodes, anterior inter-axillary anastomosis, Rt axillo-inguinal anastomosis, then Rt breast, mostly focusing on medial breast where peau d'orange present and mild fibrosis palpated, then finished retracing steps in supine. Reviewed with pt and issued handout instructing her that she could see increased swelling after radiation. MFR from Rt axilla to medial upper arm over area of cording that was mildly palpated today. Pt reports this is improving greatly P/ROM in supine to Rt shoulder for MFR into flex, abd and D2 with scapular depression throughout  05/13/23: Self Care Noted increased edema at medial aspect of breast with mild fibrosis and peau d'orange so included MLD to medial breast today with stretching. Instructed pt in  basics of anatomy of lymphatic system and principles of MLD.  Manual Therapy MLD to Rt breast as follows: Short neck, superficial and deep abdominals, Lt axilla and Rt inguinal nodes, anterior inter-axillary anastomosis, Rt axillo-inguinal anastomosis, then Rt breast, mostly focusing on medial breast where peau d'orange present and mild fibrosis palpated, then briefly into Lt S/L for focus to lateral breast where some fullness palpated, then finished retracing steps in supine.  Made a small chip pack for pt to wear at medial breast in her compression bra P/ROM of Rt shoulder into flex and abd  05/07/2023 Pulleys x 2 min flex, scaption, abduction with VC's to depress scapula Ball rolls on wall x 10 forward and ab x 5 Wall stretches in varying positions to stretch cording with UE ext overhead and wrist extension oscillations MFR to axilla, upper arm and forearm cording PROM right shoulder flex, scaption, abduction, ER    PATIENT EDUCATION:  Education details: HEP; closed chain flexion and abduction to gain last degrees of flexion and abduction. Spent time educating her and her partner [name] on scar massage, the pathology and treatment of axillary cording, and lymphedema risk reduction practices.  Person educated: Patient and Spouse Education method: Psychiatrist  comprehension: verbalized understanding and returned demonstration  HOME EXERCISE PROGRAM: Reviewed previously given post op HEP. Rt breast MLD  ASSESSMENT:  CLINICAL IMPRESSION: Progressed pt to include A/AA/ROM stretching and then also continued manual therapy working to further decrease cording. Pts cording has reduced well since start of care. She may want to be on hold after next week due to improvements, until radiation ends and return for assess after. Reviewed Rt breast MLD.  Pt will benefit from skilled therapeutic intervention to improve on the following deficits: Decreased knowledge of  precautions, impaired UE functional use, pain, decreased ROM, postural dysfunction.   PT treatment/interventions: ADL/Self care home management, [phone]- PT Re-evaluation, 97110-Therapeutic exercises, 97530- Therapeutic activity, [id number]- Neuromuscular re-education, [phone]- Self Care, 09811- Manual therapy, Patient/Family education, and Scar mobilization   GOALS: Goals reviewed with patient? Yes  LONG TERM GOALS:  (STG=LTG)  GOALS Name Target Date  Goal status  1 Pt will demonstrate she has regained full shoulder ROM and function post operatively compared to baselines.  Baseline: 06/01/2023 INITIAL  2 Patient will report >/= 50% less pain with daily tasks in her axilla and medial upper arm. 06/01/2023 INITIAL  3 Patient will improve her DASH score to be </= 8 for improved overall UE function. 06/01/2023 INITIAL  4 Patient will be able to verbalize good understanding of lymphedema risk reduction practices. 06/01/2023 INITIAL     PLAN:  PT FREQUENCY/DURATION: 2x/week for up to 4 weeks  PLAN FOR NEXT SESSION: Myofascial release and PROM to improve axillary cording; review Rt breast MLD prn   [facility]  [address], [address]  [place] [address]  [phone]

## 2023-05-15 NOTE — Patient Instructions (Signed)
 Self manual lymph drainage:      Cancer Rehab [phone] Perform this sequence once a day.  Only give enough pressure to your skin to make the skin move.  Hug yourself.  Do circles at your neck just above your collarbones.  Repeat this 10 times.  Diaphragmatic - Supine   Inhale through nose making navel move out toward hands. Exhale through puckered lips, hands follow navel in. Repeat _5__ times. Rest _10__ seconds between repeats.   Copyright  VHI. All rights reserved.   Axilla - One at a Time   Using full weight of flat hand and fingers at center of uninvolved armpit, make _10__ in-place circles.   Copyright  VHI. All rights reserved.  LEG: Inguinal Nodes Stimulation   With small finger side of hand against hip crease on involved side, gently perform circles at the crease. Repeat __10_ times.   Copyright  VHI. All rights reserved.  Axilla to Inguinal Nodes - Sweep   On involved side, sweep _4__ times from armpit along side of trunk to hip crease.  Now gently stretch skin from the involved side to the uninvolved side across the chest at the shoulder line.  Repeat that 4 times.  Draw an imaginary diagonal line from upper outer breast through the nipple area toward lower inner breast.  Direct fluid upward and inward from this line toward the pathway across your upper chest .  Do this in three rows to treat all of the upper inner breast tissue, and do each row 3-4x.      Direct fluid to treat all of lower outer breast tissue downward and outward toward pathway that is aimed at the left groin.This one best done in Lt sidelying.  Finish by doing the pathways as described above going from your involved armpit to the same side groin and going across your upper chest from the involved shoulder to the uninvolved shoulder.  Repeat the steps above where you do circles in your right groin and left armpit. Copyright  VHI. All rights reserved.

## 2023-05-18 ENCOUNTER — Ambulatory Visit
Admission: RE | Admit: 2023-05-18 | Discharge: 2023-05-18 | Disposition: A | Discharge status: Home / Self Care | Source: Ambulatory Visit | Attending: Radiation Oncology | Admitting: Radiation Oncology

## 2023-05-18 ENCOUNTER — Ambulatory Visit
Admission: RE | Admit: 2023-05-18 | Discharge: 2023-05-18 | Disposition: A | Discharge status: Home / Self Care | Payer: 59 | Source: Ambulatory Visit | Attending: Radiation Oncology | Admitting: Radiation Oncology

## 2023-05-18 ENCOUNTER — Other Ambulatory Visit: Payer: Self-pay

## 2023-05-18 DIAGNOSIS — C50211 Malignant neoplasm of upper-inner quadrant of right female breast: Secondary | ICD-10-CM

## 2023-05-18 LAB — RAD ONC ARIA SESSION SUMMARY
Course Elapsed Days: 7
Plan Fractions Treated to Date: 5
Plan Fractions Treated to Date: 5
Plan Prescribed Dose Per Fraction: 1.8 Gy
Plan Prescribed Dose Per Fraction: 1.8 Gy
Plan Total Fractions Prescribed: 28
Plan Total Fractions Prescribed: 28
Plan Total Prescribed Dose: 50.4 Gy
Plan Total Prescribed Dose: 50.4 Gy
Reference Point Dosage Given to Date: 9 Gy
Reference Point Dosage Given to Date: 9 Gy
Reference Point Session Dosage Given: 1.8 Gy
Reference Point Session Dosage Given: 1.8 Gy
Session Number: 5

## 2023-05-18 MED ORDER — RADIAPLEXRX EX GEL: Freq: Once | CUTANEOUS | Status: AC

## 2023-05-18 MED ORDER — ALRA NON-METALLIC DEODORANT (RAD-ONC)
1.0000 | Freq: Once | TOPICAL | Status: AC
  Administered 2023-05-18: 1 via TOPICAL

## 2023-05-19 ENCOUNTER — Ambulatory Visit
Admission: RE | Admit: 2023-05-19 | Discharge: 2023-05-19 | Disposition: A | Discharge status: Home / Self Care | Payer: 59 | Source: Ambulatory Visit | Attending: Radiation Oncology | Admitting: Radiation Oncology

## 2023-05-19 ENCOUNTER — Encounter: Payer: Self-pay | Admitting: Physical Therapy

## 2023-05-19 ENCOUNTER — Ambulatory Visit: Payer: Commercial Managed Care - PPO | Admitting: Physical Therapy

## 2023-05-19 ENCOUNTER — Other Ambulatory Visit: Payer: Self-pay

## 2023-05-19 DIAGNOSIS — Z483 Aftercare following surgery for neoplasm: Secondary | ICD-10-CM

## 2023-05-19 DIAGNOSIS — Z170 Estrogen receptor positive status [ER+]: Secondary | ICD-10-CM

## 2023-05-19 DIAGNOSIS — C50211 Malignant neoplasm of upper-inner quadrant of right female breast: Secondary | ICD-10-CM | POA: Diagnosis not present

## 2023-05-19 DIAGNOSIS — M25611 Stiffness of right shoulder, not elsewhere classified: Secondary | ICD-10-CM

## 2023-05-19 DIAGNOSIS — R293 Abnormal posture: Secondary | ICD-10-CM

## 2023-05-19 LAB — RAD ONC ARIA SESSION SUMMARY

## 2023-05-19 MED ORDER — RADIAPLEXRX EX GEL
Freq: Once | CUTANEOUS | Status: AC
  Administered 2023-05-19: 1 via TOPICAL

## 2023-05-19 MED ORDER — ALRA NON-METALLIC DEODORANT (RAD-ONC)
1.0000 | Freq: Once | TOPICAL | Status: AC
  Administered 2023-05-19: 1 via TOPICAL

## 2023-05-19 NOTE — Patient Instructions (Signed)
 Over Head Pull: Narrow and Wide Grip   Cancer Rehab [phone]   On back, knees bent, feet flat, band across thighs, elbows straight but relaxed. Pull hands apart (start). Keeping elbows straight, bring arms up and over head, hands toward floor. Keep pull steady on band. Hold momentarily. Return slowly, keeping pull steady, back to start. Then do same with a wider grip on the band (past shoulder width) Repeat _10__ times. Band color __yellow____   Side Pull: Double Arm   On back, knees bent, feet flat. Arms perpendicular to body, shoulder level, elbows straight but relaxed. Pull arms out to sides, elbows straight. Resistance band comes across collarbones, hands toward floor. Hold momentarily. Slowly return to starting position. Repeat _10__ times. Band color _yellow____   Sword   On back, knees bent, feet flat, left hand on left hip, right hand above left. Pull right arm DIAGONALLY (hip to shoulder) across chest. Bring right arm along head toward floor. Hold momentarily. Thumb is pointed down when by hip and rotates upwards whne by head. Slowly return to starting position. Repeat _10__ times. Do with left arm. Band color _yellow_____   Shoulder Rotation: Double Arm   On back, knees bent, feet flat, elbows tucked at sides, bent 90, hands palms up. Pull hands apart and down toward floor, keeping elbows near sides. Hold momentarily. Slowly return to starting position. Repeat _10__ times. Band color __yellow____

## 2023-05-19 NOTE — Therapy (Signed)
 OUTPATIENT PHYSICAL THERAPY BREAST CANCER TREATMENT   Patient Name: Darlene Patterson MRN: [id number] DOB:[birth year], 53 y.o., female Today's Date: 05/19/2023  END OF SESSION:  PT End of Session - 05/19/23 1509     Visit Number 6    Number of Visits 10    Date for PT Re-Evaluation 06/01/23    PT Start Time 1400    PT Stop Time 1500    PT Time Calculation (min) 60 min    Activity Tolerance Patient tolerated treatment well    Behavior During Therapy [facility] for tasks assessed/performed              Past Medical History:  Diagnosis Date   Anxiety    with anesthesia   Cancer (HCC) 03/2023   right breast IDC   Complication of anesthesia    Great toe pain    cyst left great toe- to be excised 05-24-14- [place]   H/O hiatal hernia    per upper GI 3/13   Heart murmur    early teens- resolved   History of kidney stones    Obesity    Peripheral vascular disease (HCC)    varicose veins- left   PONV (postoperative nausea and vomiting)    'SEVERE PER PT"   Status post gastric banding    Past Surgical History:  Procedure Laterality Date   ABDOMINAL HYSTERECTOMY  3/13   ACHILLES TENDON REPAIR  08, 09   bilateral   BREAST BIOPSY Right 03/12/2023   [place] RT BREAST BX W LOC DEV 1ST LESION IMG BX SPEC US GUIDE 03/12/2023 [facility]   BREAST BIOPSY  03/31/2023   MM RT RADIOACTIVE SEED LOC MAMMO GUIDE 03/31/2023 [facility]   BREAST LUMPECTOMY WITH RADIOACTIVE SEED AND SENTINEL LYMPH NODE BIOPSY Right 04/01/2023   Procedure: RIGHT BREAST SEED GUIDED LUMPECTOMY AND RIGHT AXILLARY SENTINEL NODE BIOPSY;  Surgeon: Emelia Loron, MD;  Location: [facility];  Service: General;  Laterality: Right;  pec block   BREATH TEK H PYLORI  05/21/2011   Procedure: BREATH TEK H PYLORI;  Surgeon: Valarie Merino, MD;  Location: [facility];  Service: General;  Laterality: N/A;   CESAREAN SECTION  89, 93   CHOLECYSTECTOMY  2011   CYSTOSCOPY W/ URETERAL STENT PLACEMENT Left  05/16/2014   Procedure: CYSTOSCOPY WITH RETROGRADE PYELOGRAM/URETERAL STENT PLACEMENT;  Surgeon: Crist Fat, MD;  Location: [facility];  Service: Urology;  Laterality: Left;   CYSTOSCOPY WITH URETEROSCOPY AND STENT PLACEMENT Left 05/31/2014   Procedure: LEFT URETEROSCOPY AND STENT EXCHANGE, LEFT RETROGRADE;  Surgeon: Crist Fat, MD;  Location: [facility];  Service: Urology;  Laterality: Left;   EYE SURGERY  75,85   repair lazy eye   HIATAL HERNIA REPAIR  10/07/2011   Procedure: LAPAROSCOPIC REPAIR OF HIATAL HERNIA;  Surgeon: Valarie Merino, MD;  Location: [facility];  Service: General;  Laterality: N/A;   LAPAROSCOPIC GASTRIC BANDING  10/07/2011   Procedure: LAPAROSCOPIC GASTRIC BANDING;  Surgeon: Valarie Merino, MD;  Location: [facility];  Service: General;  Laterality: N/A;   VASCULAR SURGERY     left leg- s/p  injections of veins   Patient Active Problem List   Diagnosis Date Noted   Genetic testing 03/30/2023   Malignant neoplasm of upper-inner quadrant of right breast in female, estrogen receptor positive (HCC) 03/17/2023   Lapband APS + [facility] repair July 2013 10/08/2011   Obesity BMI 41 05/01/2011    REFERRING PROVIDER: Dr. Emelia Loron  REFERRING DIAG:  Right breast cancer  THERAPY DIAG:  No diagnosis found.  Rationale for Evaluation and Treatment: Rehabilitation  ONSET DATE: 04/01/2023  SUBJECTIVE:                                                                                                                                                                                           SUBJECTIVE STATEMENT I am doing better. I feel like the cording is better.    EVAL: Patient reports she underwent a right lumpectomy and sentinel node biopsy on 04/01/2023 with 1 of 2 lymph nodes positive for carcinoma.Her Oncotype score was low so she will proceed with radiation on 05/11/2023 and anti-estrogen therapy.  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 She underwent a right lumpectomy and sentinel node biopsy on 04/01/2023 with 1 of 2 lymph nodes positive for carcinoma. It is ER/PR positive and HER2 negative with a Ki67 of 5%.   PATIENT GOALS:  Reassess how my recovery is going related to arm function, pain, and swelling.  PAIN:  Are you having pain? No  PRECAUTIONS: Recent Surgery, right UE Lymphedema risk  RED FLAGS: None   ACTIVITY LEVEL / LEISURE: She walks 3x/week for 20 min each time   OBJECTIVE:   PATIENT SURVEYS:  QUICK DASH:    OBSERVATIONS: Very easily palpable axillary cording present from axilla to medial mid upper arm. Negative neural tension test; incisions healed well with no signs of redness, swelling, or infection.  POSTURE:  Forward head and rounded shoulders posture  LYMPHEDEMA ASSESSMENT:   UPPER EXTREMITY AROM/PROM:   A/PROM RIGHT   eval   RIGHT 05/04/2023  Shoulder extension 45 54  Shoulder flexion 161 149  Shoulder abduction 166 160  Shoulder internal rotation 76 60  Shoulder external rotation 88 90                          (Blank rows = not tested)   A/PROM LEFT   eval  Shoulder extension 48  Shoulder flexion 152  Shoulder abduction 168  Shoulder internal rotation 79  Shoulder external rotation 88                          (Blank rows = not tested)   CERVICAL AROM: All within normal limits   UPPER EXTREMITY STRENGTH: WNL   LYMPHEDEMA ASSESSMENTS (in cm):    LANDMARK RIGHT   eval RIGHT 05/04/2023  10 cm proximal to olecranon process 36.6 35.8  Olecranon process 29.7 29.6  10 cm proximal to ulnar  styloid process 25.1 24.6  Just proximal to ulnar styloid process 18.6 18.5  Across hand at thumb web space 20.3 20.7  At base of 2nd digit 7.1 6.9  (Blank rows = not tested)   LANDMARK LEFT   eval LEFT 05/04/2023  10 cm proximal to olecranon process 37 36.3  Olecranon process 29 29.5  10 cm proximal to ulnar styloid process 25.5 25.2  Just proximal  to ulnar styloid process 19.2 18.7  Across hand at thumb web space 20.6 19.6  At base of 2nd digit 7.4 7.2  (Blank rows = not tested)  Surgery type/Date: 04/01/2023 right lumpectomy and sentinel node biopsy Number of lymph nodes removed: 2 Current/past treatment (chemo, radiation, hormone therapy): none Other symptoms:  Heaviness/tightness Yes Pain Yes Pitting edema No Infections No Decreased scar mobility Yes Stemmer sign No   TREATMENT TODAY 05/19/23: Therapeutic Exercises Pulleys into flex and abd x 2 mins each with demo and VC's to decrease Rt scapular compensation Supine over half foam roll for following: Bil UE horz abd with yellow band x 10 reps, bilateral ER with yellow band x 10 reps, bilateral diagonals x 10 reps, bilateral flexion with narrow and wide grip x 10 reps each all with yellow band- pt returned therapist demo Manual Therapy MLD to Rt breast as follows: Short neck, 5 diaphragmatic breaths, Lt axilla and Rt inguinal nodes, anterior inter-axillary anastomosis, Rt axillo-inguinal anastomosis, then Rt breast, mostly focusing on medial breast where peau d'orange present and mild fibrosis palpated, then finished retracing steps in supine. Educated pt and spouse in proper technique throughout.  MFR at R forearm which was the only place cording could be palpated today  05/15/23: Therapeutic Exercises Pulleys into flex and abd x 2 mins each with demo and VC's to decrease Rt scapular compensation Roll yellow ball up wall into flex, and Rt abd x 10 each Supine over half foam roll for following: Bil UE horz abd and then bil UE scaption into a "V" x 10 each, then bil UE abd into a "snow angel" with 5 sec holds, x 10 returning therapist demo Manual Therapy MLD to Rt breast as follows: Short neck, 5 diaphragmatic breaths, Lt axilla and Rt inguinal nodes, anterior inter-axillary anastomosis, Rt axillo-inguinal anastomosis, then Rt breast, mostly focusing on medial breast where peau  d'orange present and mild fibrosis palpated, then finished retracing steps in supine. Reviewed with pt and issued handout instructing her that she could see increased swelling after radiation. MFR from Rt axilla to medial upper arm over area of cording that was mildly palpated today. Pt reports this is improving greatly P/ROM in supine to Rt shoulder for MFR into flex, abd and D2 with scapular depression throughout  05/13/23: Self Care Noted increased edema at medial aspect of breast with mild fibrosis and peau d'orange so included MLD to medial breast today with stretching. Instructed pt in basics of anatomy of lymphatic system and principles of MLD.  Manual Therapy MLD to Rt breast as follows: Short neck, superficial and deep abdominals, Lt axilla and Rt inguinal nodes, anterior inter-axillary anastomosis, Rt axillo-inguinal anastomosis, then Rt breast, mostly focusing on medial breast where peau d'orange present and mild fibrosis palpated, then briefly into Lt S/L for focus to lateral breast where some fullness palpated, then finished retracing steps in supine.  Made a small chip pack for pt to wear at medial breast in her compression bra P/ROM of Rt shoulder into flex and abd  05/07/2023 Pulleys x 2 min  flex, scaption, abduction with VC's to depress scapula Ball rolls on wall x 10 forward and ab x 5 Wall stretches in varying positions to stretch cording with UE ext overhead and wrist extension oscillations MFR to axilla, upper arm and forearm cording PROM right shoulder flex, scaption, abduction, ER    PATIENT EDUCATION:  Education details: HEP; closed chain flexion and abduction to gain last degrees of flexion and abduction. Spent time educating her and her partner [name] on scar massage, the pathology and treatment of axillary cording, and lymphedema risk reduction practices.  Person educated: Patient and Spouse Education method: Medical illustrator Education comprehension:  verbalized understanding and returned demonstration  HOME EXERCISE PROGRAM: Reviewed previously given post op HEP. Rt breast MLD Supine scap series  ASSESSMENT:  CLINICAL IMPRESSION: Instructed pt in supine scapular strengthening exercises over 1/2 foam roll today and issued these as part of her HEP. Continued with R breast MLD and instructed pt and spouse in correct technique. Pt's cording is doing much better. Pt may want to be placed on hold after next session until the end of radiation.   Pt will benefit from skilled therapeutic intervention to improve on the following deficits: Decreased knowledge of precautions, impaired UE functional use, pain, decreased ROM, postural dysfunction.   PT treatment/interventions: ADL/Self care home management, [phone]- PT Re-evaluation, 97110-Therapeutic exercises, 97530- Therapeutic activity, [id number]- Neuromuscular re-education, [phone]- Self Care, [id number]- Manual therapy, Patient/Family education, and Scar mobilization   GOALS: Goals reviewed with patient? Yes  LONG TERM GOALS:  (STG=LTG)  GOALS Name Target Date  Goal status  1 Pt will demonstrate she has regained full shoulder ROM and function post operatively compared to baselines.  Baseline: 06/01/2023 INITIAL  2 Patient will report >/= 50% less pain with daily tasks in her axilla and medial upper arm. 06/01/2023 INITIAL  3 Patient will improve her DASH score to be </= 8 for improved overall UE function. 06/01/2023 INITIAL  4 Patient will be able to verbalize good understanding of lymphedema risk reduction practices. 06/01/2023 INITIAL     PLAN:  PT FREQUENCY/DURATION: 2x/week for up to 4 weeks  PLAN FOR NEXT SESSION: assess indep with supine scap, Myofascial release and PROM to improve axillary cording; review Rt breast MLD prn   [facility]  [address]  [place] [address]  [phone]

## 2023-05-20 ENCOUNTER — Other Ambulatory Visit: Payer: Self-pay

## 2023-05-20 ENCOUNTER — Ambulatory Visit
Admission: RE | Admit: 2023-05-20 | Discharge: 2023-05-20 | Disposition: A | Discharge status: Home / Self Care | Payer: 59 | Source: Ambulatory Visit | Attending: Radiation Oncology | Admitting: Radiation Oncology

## 2023-05-20 DIAGNOSIS — C50211 Malignant neoplasm of upper-inner quadrant of right female breast: Secondary | ICD-10-CM | POA: Diagnosis not present

## 2023-05-20 LAB — RAD ONC ARIA SESSION SUMMARY
Course Elapsed Days: 9
Plan Fractions Treated to Date: 7
Plan Fractions Treated to Date: 7
Plan Prescribed Dose Per Fraction: 1.8 Gy
Plan Prescribed Dose Per Fraction: 1.8 Gy
Plan Total Fractions Prescribed: 28
Plan Total Fractions Prescribed: 28
Plan Total Prescribed Dose: 50.4 Gy
Plan Total Prescribed Dose: 50.4 Gy
Reference Point Dosage Given to Date: 12.6 Gy
Reference Point Dosage Given to Date: 12.6 Gy
Reference Point Session Dosage Given: 1.8 Gy
Reference Point Session Dosage Given: 1.8 Gy
Session Number: 7

## 2023-05-21 ENCOUNTER — Other Ambulatory Visit: Payer: Self-pay

## 2023-05-21 ENCOUNTER — Ambulatory Visit
Admission: RE | Admit: 2023-05-21 | Discharge: 2023-05-21 | Disposition: A | Discharge status: Home / Self Care | Payer: 59 | Source: Ambulatory Visit | Attending: Radiation Oncology | Admitting: Radiation Oncology

## 2023-05-21 ENCOUNTER — Ambulatory Visit: Payer: Commercial Managed Care - PPO

## 2023-05-21 DIAGNOSIS — C50211 Malignant neoplasm of upper-inner quadrant of right female breast: Secondary | ICD-10-CM

## 2023-05-21 DIAGNOSIS — R293 Abnormal posture: Secondary | ICD-10-CM

## 2023-05-21 DIAGNOSIS — M25611 Stiffness of right shoulder, not elsewhere classified: Secondary | ICD-10-CM

## 2023-05-21 DIAGNOSIS — Z483 Aftercare following surgery for neoplasm: Secondary | ICD-10-CM

## 2023-05-21 LAB — RAD ONC ARIA SESSION SUMMARY
Course Elapsed Days: 10
Plan Fractions Treated to Date: 8
Plan Fractions Treated to Date: 8
Plan Prescribed Dose Per Fraction: 1.8 Gy
Plan Prescribed Dose Per Fraction: 1.8 Gy
Plan Total Fractions Prescribed: 28
Plan Total Fractions Prescribed: 28
Plan Total Prescribed Dose: 50.4 Gy
Plan Total Prescribed Dose: 50.4 Gy
Reference Point Dosage Given to Date: 14.4 Gy
Reference Point Dosage Given to Date: 14.4 Gy
Reference Point Session Dosage Given: 1.8 Gy
Reference Point Session Dosage Given: 1.8 Gy
Session Number: 8

## 2023-05-21 NOTE — Therapy (Signed)
 OUTPATIENT PHYSICAL THERAPY BREAST CANCER TREATMENT   Patient Name: Darlene Patterson MRN: [id number] DOB:Nov 20, 1970, 53 y.o., female Today's Date: 05/21/2023  END OF SESSION:  PT End of Session - 05/21/23 1103     Visit Number 7    Number of Visits 10    Date for PT Re-Evaluation 06/01/23    PT Start Time 1104    PT Stop Time 1157    PT Time Calculation (min) 53 min    Activity Tolerance Patient tolerated treatment well    Behavior During Therapy [facility] for tasks assessed/performed              Past Medical History:  Diagnosis Date   Anxiety    with anesthesia   Cancer (HCC) 03/2023   right breast IDC   Complication of anesthesia    Great toe pain    cyst left great toe- to be excised 05-24-14- [place]   H/O hiatal hernia    per upper GI 3/13   Heart murmur    early teens- resolved   History of kidney stones    Obesity    Peripheral vascular disease (HCC)    varicose veins- left   PONV (postoperative nausea and vomiting)    'SEVERE PER PT"   Status post gastric banding    Past Surgical History:  Procedure Laterality Date   ABDOMINAL HYSTERECTOMY  3/13   ACHILLES TENDON REPAIR  08, 09   bilateral   BREAST BIOPSY Right 03/12/2023   [place] RT BREAST BX W LOC DEV 1ST LESION IMG BX SPEC US GUIDE 03/12/2023 [facility]   BREAST BIOPSY  03/31/2023   MM RT RADIOACTIVE SEED LOC MAMMO GUIDE 03/31/2023 [facility]AMMO[facility]   BREAST LUMPECTOMY WITH RADIOACTIVE SEED AND SENTINEL LYMPH NODE BIOPSY Right 04/01/2023   Procedure: RIGHT BREAST SEED GUIDED LUMPECTOMY AND RIGHT AXILLARY SENTINEL NODE BIOPSY;  Surgeon: Emelia Loron, MD;  Location: [facility];  Service: General;  Laterality: Right;  pec block   BREATH TEK H PYLORI  05/21/2011   Procedure: BREATH TEK H PYLORI;  Surgeon: Valarie Merino, MD;  Location: [facility];  Service: General;  Laterality: N/A;   CESAREAN SECTION  89, 93   CHOLECYSTECTOMY  2011   CYSTOSCOPY W/ URETERAL STENT PLACEMENT Left  05/16/2014   Procedure: CYSTOSCOPY WITH RETROGRADE PYELOGRAM/URETERAL STENT PLACEMENT;  Surgeon: Crist Fat, MD;  Location: [facility];  Service: Urology;  Laterality: Left;   CYSTOSCOPY WITH URETEROSCOPY AND STENT PLACEMENT Left 05/31/2014   Procedure: LEFT URETEROSCOPY AND STENT EXCHANGE, LEFT RETROGRADE;  Surgeon: Crist Fat, MD;  Location: [facility];  Service: Urology;  Laterality: Left;   EYE SURGERY  75,85   repair lazy eye   HIATAL HERNIA REPAIR  10/07/2011   Procedure: LAPAROSCOPIC REPAIR OF HIATAL HERNIA;  Surgeon: Valarie Merino, MD;  Location: [facility];  Service: General;  Laterality: N/A;   LAPAROSCOPIC GASTRIC BANDING  10/07/2011   Procedure: LAPAROSCOPIC GASTRIC BANDING;  Surgeon: Valarie Merino, MD;  Location: [facility];  Service: General;  Laterality: N/A;   VASCULAR SURGERY     left leg- s/p  injections of veins   Patient Active Problem List   Diagnosis Date Noted   Genetic testing 03/30/2023   Malignant neoplasm of upper-inner quadrant of right breast in female, estrogen receptor positive (HCC) 03/17/2023   Lapband APS + [facility] repair July 2013 10/08/2011   Obesity BMI 41 05/01/2011    REFERRING PROVIDER: Dr. Emelia Loron  REFERRING DIAG:  Right breast cancer  THERAPY DIAG:  Stiffness of right shoulder, not elsewhere classified  Aftercare following surgery for neoplasm  Abnormal posture  Malignant neoplasm of upper-inner quadrant of right breast in female, estrogen receptor positive (HCC)  Rationale for Evaluation and Treatment: Rehabilitation  ONSET DATE: 04/01/2023  SUBJECTIVE:                                                                                                                                                                                           SUBJECTIVE STATEMENT I am doing better. I don't feel the cording in my arm at all. Jorja Loa has been doing the MLD and I can tell it gets softer. My skin is doing well with  radiation.   EVAL: Patient reports she underwent a right lumpectomy and sentinel node biopsy on 04/01/2023 with 1 of 2 lymph nodes positive for carcinoma.Her Oncotype score was low so she will proceed with radiation on 05/11/2023 and anti-estrogen therapy.  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 She underwent a right lumpectomy and sentinel node biopsy on 04/01/2023 with 1 of 2 lymph nodes positive for carcinoma. It is ER/PR positive and HER2 negative with a Ki67 of 5%.   PATIENT GOALS:  Reassess how my recovery is going related to arm function, pain, and swelling.  PAIN:  Are you having pain? No  PRECAUTIONS: Recent Surgery, right UE Lymphedema risk  RED FLAGS: None   ACTIVITY LEVEL / LEISURE: She walks 3x/week for 20 min each time   OBJECTIVE:   PATIENT SURVEYS:  QUICK DASH:    OBSERVATIONS: Very easily palpable axillary cording present from axilla to medial mid upper arm. Negative neural tension test; incisions healed well with no signs of redness, swelling, or infection.  POSTURE:  Forward head and rounded shoulders posture  LYMPHEDEMA ASSESSMENT:   UPPER EXTREMITY AROM/PROM:   A/PROM RIGHT   eval   RIGHT 05/04/2023  Shoulder extension 45 54  Shoulder flexion 161 149  Shoulder abduction 166 160  Shoulder internal rotation 76 60  Shoulder external rotation 88 90                          (Blank rows = not tested)   A/PROM LEFT   eval  Shoulder extension 48  Shoulder flexion 152  Shoulder abduction 168  Shoulder internal rotation 79  Shoulder external rotation 88                          (Blank rows = not tested)  CERVICAL AROM: All within normal limits   UPPER EXTREMITY STRENGTH: WNL   LYMPHEDEMA ASSESSMENTS (in cm):    LANDMARK RIGHT   eval RIGHT 05/04/2023  10 cm proximal to olecranon process 36.6 35.8  Olecranon process 29.7 29.6  10 cm proximal to ulnar styloid process 25.1 24.6   Just proximal to ulnar styloid process 18.6 18.5  Across hand at thumb web space 20.3 20.7  At base of 2nd digit 7.1 6.9  (Blank rows = not tested)   LANDMARK LEFT   eval LEFT 05/04/2023  10 cm proximal to olecranon process 37 36.3  Olecranon process 29 29.5  10 cm proximal to ulnar styloid process 25.5 25.2  Just proximal to ulnar styloid process 19.2 18.7  Across hand at thumb web space 20.6 19.6  At base of 2nd digit 7.4 7.2  (Blank rows = not tested)  Surgery type/Date: 04/01/2023 right lumpectomy and sentinel node biopsy Number of lymph nodes removed: 2 Current/past treatment (chemo, radiation, hormone therapy): none Other symptoms:  Heaviness/tightness Yes Pain Yes Pitting edema No Infections No Decreased scar mobility Yes Stemmer sign No   TREATMENT TODAY 05/21/2023 Therapeutic Exercise Ball rolls on wall x 10 flexion and abduction Supine over half foam roll for following: Bil UE horz abd with yellow band x 10 reps, bilateral ER with yellow band x 10 reps, bilateral diagonals x 10 reps, bilateral flexion with narrow and wide grip x 10 reps each all with yellow band- pt performed all without difficulty MANUAL Checked axilla, upper arm and forearm for cording. MFR to upper arm and forearm cords very palpable but not very visible. 1 pop noted at upper arm MLD to Rt breast as follows: Short neck, 5 diaphragmatic breaths, Lt axilla , anterior inter-axillary anastamosis, then Rt breast, focusing on medial breast where peau d'orange present and mild fibrosis palpated, then finished retracing steps in supine.  05/19/23: Therapeutic Exercises Pulleys into flex and abd x 2 mins each with demo and VC's to decrease Rt scapular compensation Supine over half foam roll for following: Bil UE horz abd with yellow band x 10 reps, bilateral ER with yellow band x 10 reps, bilateral diagonals x 10 reps, bilateral flexion with narrow and wide grip x 10 reps each all with yellow band- pt  returned therapist demo Manual Therapy MLD to Rt breast as follows: Short neck, 5 diaphragmatic breaths, Lt axilla and Rt inguinal nodes, anterior inter-axillary anastomosis, Rt axillo-inguinal anastomosis, then Rt breast, mostly focusing on medial breast where peau d'orange present and mild fibrosis palpated, then finished retracing steps in supine. Educated pt and spouse in proper technique throughout.  MFR at R forearm which was the only place cording could be palpated today  05/15/23: Therapeutic Exercises Pulleys into flex and abd x 2 mins each with demo and VC's to decrease Rt scapular compensation Roll yellow ball up wall into flex, and Rt abd x 10 each Supine over half foam roll for following: Bil UE horz abd and then bil UE scaption into a "V" x 10 each, then bil UE abd into a "snow angel" with 5 sec holds, x 10 returning therapist demo Manual Therapy MLD to Rt breast as follows: Short neck, 5 diaphragmatic breaths, Lt axilla and Rt inguinal nodes, anterior inter-axillary anastomosis, Rt axillo-inguinal anastomosis, then Rt breast, mostly focusing on medial breast where peau d'orange present and mild fibrosis palpated, then finished retracing steps in supine. Reviewed with pt and issued handout instructing her that she could see increased  swelling after radiation. MFR from Rt axilla to medial upper arm over area of cording that was mildly palpated today. Pt reports this is improving greatly P/ROM in supine to Rt shoulder for MFR into flex, abd and D2 with scapular depression throughout  05/13/23: Self Care Noted increased edema at medial aspect of breast with mild fibrosis and peau d'orange so included MLD to medial breast today with stretching. Instructed pt in basics of anatomy of lymphatic system and principles of MLD.  Manual Therapy MLD to Rt breast as follows: Short neck, superficial and deep abdominals, Lt axilla and Rt inguinal nodes, anterior inter-axillary anastomosis, Rt  axillo-inguinal anastomosis, then Rt breast, mostly focusing on medial breast where peau d'orange present and mild fibrosis palpated, then briefly into Lt S/L for focus to lateral breast where some fullness palpated, then finished retracing steps in supine.  Made a small chip pack for pt to wear at medial breast in her compression bra P/ROM of Rt shoulder into flex and abd  05/07/2023 Pulleys x 2 min flex, scaption, abduction with VC's to depress scapula Ball rolls on wall x 10 forward and ab x 5 Wall stretches in varying positions to stretch cording with UE ext overhead and wrist extension oscillations MFR to axilla, upper arm and forearm cording PROM right shoulder flex, scaption, abduction, ER    PATIENT EDUCATION:  Education details: HEP; closed chain flexion and abduction to gain last degrees of flexion and abduction. Spent time educating her and her partner [name] on scar massage, the pathology and treatment of axillary cording, and lymphedema risk reduction practices.  Person educated: Patient and Spouse Education method: Medical illustrator Education comprehension: verbalized understanding and returned demonstration  HOME EXERCISE PROGRAM: Reviewed previously given post op HEP. Rt breast MLD Supine scap series  ASSESSMENT:  CLINICAL IMPRESSION: Pt reports no tightness from cording although cording is still noted in axilla and forearm and 1 pop occurred at upper arm with MFR techniques.  She demonstrates full AROM.She did well with review of supine scapular series and is in agreement to put therapy on hold until after radiation.  Pt will benefit from skilled therapeutic intervention to improve on the following deficits: Decreased knowledge of precautions, impaired UE functional use, pain, decreased ROM, postural dysfunction.   PT treatment/interventions: ADL/Self care home management, [phone]- PT Re-evaluation, 97110-Therapeutic exercises, 97530- Therapeutic activity, [id number]  Neuromuscular re-education, [phone]- Self Care, [id number]- Manual therapy, Patient/Family education, and Scar mobilization   GOALS: Goals reviewed with patient? Yes  LONG TERM GOALS:  (STG=LTG)  GOALS Name Target Date  Goal status  1 Pt will demonstrate she has regained full shoulder ROM and function post operatively compared to baselines.  Baseline: 06/01/2023 INITIAL  2 Patient will report >/= 50% less pain with daily tasks in her axilla and medial upper arm. 06/01/2023 MET 05/21/2023  3 Patient will improve her DASH score to be </= 8 for improved overall UE function. 06/01/2023 INITIAL  4 Patient will be able to verbalize good understanding of lymphedema risk reduction practices. 06/01/2023 INITIAL     PLAN:  PT FREQUENCY/DURATION: 2x/week for up to 4 weeks  PLAN FOR NEXT SESSION: reassess post radiation or sooner if pt requires. On hold, Myofascial release and PROM to improve axillary cording; review Rt breast MLD prn   [facility]  [address]  [place] [address]  [phone]

## 2023-05-22 ENCOUNTER — Ambulatory Visit
Admission: RE | Admit: 2023-05-22 | Discharge: 2023-05-22 | Disposition: A | Discharge status: Home / Self Care | Payer: 59 | Source: Ambulatory Visit | Attending: Radiation Oncology | Admitting: Radiation Oncology

## 2023-05-22 ENCOUNTER — Other Ambulatory Visit: Payer: Self-pay

## 2023-05-22 ENCOUNTER — Ambulatory Visit
Admission: RE | Admit: 2023-05-22 | Discharge: 2023-05-22 | Disposition: A | Discharge status: Home / Self Care | Source: Ambulatory Visit | Attending: Radiation Oncology | Admitting: Radiation Oncology

## 2023-05-22 DIAGNOSIS — C50211 Malignant neoplasm of upper-inner quadrant of right female breast: Secondary | ICD-10-CM | POA: Diagnosis not present

## 2023-05-22 LAB — RAD ONC ARIA SESSION SUMMARY
Course Elapsed Days: 11
Plan Fractions Treated to Date: 9
Plan Fractions Treated to Date: 9
Plan Prescribed Dose Per Fraction: 1.8 Gy
Plan Prescribed Dose Per Fraction: 1.8 Gy
Plan Total Fractions Prescribed: 28
Plan Total Fractions Prescribed: 28
Plan Total Prescribed Dose: 50.4 Gy
Plan Total Prescribed Dose: 50.4 Gy
Reference Point Dosage Given to Date: 16.2 Gy
Reference Point Dosage Given to Date: 16.2 Gy
Reference Point Session Dosage Given: 1.8 Gy
Reference Point Session Dosage Given: 1.8 Gy
Session Number: 9

## 2023-05-25 ENCOUNTER — Ambulatory Visit
Admission: RE | Admit: 2023-05-25 | Discharge: 2023-05-25 | Disposition: A | Discharge status: Home / Self Care | Payer: 59 | Source: Ambulatory Visit | Attending: Radiation Oncology | Admitting: Radiation Oncology

## 2023-05-25 ENCOUNTER — Other Ambulatory Visit: Payer: Self-pay

## 2023-05-25 DIAGNOSIS — C50211 Malignant neoplasm of upper-inner quadrant of right female breast: Secondary | ICD-10-CM | POA: Diagnosis not present

## 2023-05-25 LAB — RAD ONC ARIA SESSION SUMMARY
Course Elapsed Days: 14
Plan Fractions Treated to Date: 10
Plan Fractions Treated to Date: 10
Plan Prescribed Dose Per Fraction: 1.8 Gy
Plan Prescribed Dose Per Fraction: 1.8 Gy
Plan Total Fractions Prescribed: 28
Plan Total Fractions Prescribed: 28
Plan Total Prescribed Dose: 50.4 Gy
Plan Total Prescribed Dose: 50.4 Gy
Reference Point Dosage Given to Date: 18 Gy
Reference Point Dosage Given to Date: 18 Gy
Reference Point Session Dosage Given: 1.8 Gy
Reference Point Session Dosage Given: 1.8 Gy
Session Number: 10

## 2023-05-26 ENCOUNTER — Ambulatory Visit
Admission: RE | Admit: 2023-05-26 | Discharge: 2023-05-26 | Disposition: A | Discharge status: Home / Self Care | Payer: 59 | Source: Ambulatory Visit | Attending: Radiation Oncology | Admitting: Radiation Oncology

## 2023-05-26 ENCOUNTER — Other Ambulatory Visit: Payer: Self-pay

## 2023-05-26 ENCOUNTER — Ambulatory Visit: Payer: Commercial Managed Care - PPO

## 2023-05-26 DIAGNOSIS — C50211 Malignant neoplasm of upper-inner quadrant of right female breast: Secondary | ICD-10-CM | POA: Diagnosis not present

## 2023-05-26 LAB — RAD ONC ARIA SESSION SUMMARY

## 2023-05-27 ENCOUNTER — Other Ambulatory Visit: Payer: Self-pay

## 2023-05-27 ENCOUNTER — Ambulatory Visit
Admission: RE | Admit: 2023-05-27 | Discharge: 2023-05-27 | Disposition: A | Discharge status: Home / Self Care | Payer: 59 | Source: Ambulatory Visit | Attending: Radiation Oncology | Admitting: Radiation Oncology

## 2023-05-27 DIAGNOSIS — C50211 Malignant neoplasm of upper-inner quadrant of right female breast: Secondary | ICD-10-CM | POA: Diagnosis not present

## 2023-05-27 LAB — RAD ONC ARIA SESSION SUMMARY
Course Elapsed Days: 16
Plan Fractions Treated to Date: 12
Plan Fractions Treated to Date: 12
Plan Prescribed Dose Per Fraction: 1.8 Gy
Plan Prescribed Dose Per Fraction: 1.8 Gy
Plan Total Fractions Prescribed: 28
Plan Total Fractions Prescribed: 28
Plan Total Prescribed Dose: 50.4 Gy
Plan Total Prescribed Dose: 50.4 Gy
Reference Point Dosage Given to Date: 21.6 Gy
Reference Point Dosage Given to Date: 21.6 Gy
Reference Point Session Dosage Given: 1.8 Gy
Reference Point Session Dosage Given: 1.8 Gy
Session Number: 12

## 2023-05-28 ENCOUNTER — Ambulatory Visit
Admission: RE | Admit: 2023-05-28 | Discharge: 2023-05-28 | Disposition: A | Discharge status: Home / Self Care | Payer: 59 | Source: Ambulatory Visit | Attending: Radiation Oncology | Admitting: Radiation Oncology

## 2023-05-28 ENCOUNTER — Other Ambulatory Visit: Payer: Self-pay

## 2023-05-28 DIAGNOSIS — C50211 Malignant neoplasm of upper-inner quadrant of right female breast: Secondary | ICD-10-CM | POA: Diagnosis not present

## 2023-05-28 LAB — RAD ONC ARIA SESSION SUMMARY
Course Elapsed Days: 17
Plan Fractions Treated to Date: 13
Plan Fractions Treated to Date: 13
Plan Prescribed Dose Per Fraction: 1.8 Gy
Plan Prescribed Dose Per Fraction: 1.8 Gy
Plan Total Fractions Prescribed: 28
Plan Total Fractions Prescribed: 28
Plan Total Prescribed Dose: 50.4 Gy
Plan Total Prescribed Dose: 50.4 Gy
Reference Point Dosage Given to Date: 23.4 Gy
Reference Point Dosage Given to Date: 23.4 Gy
Reference Point Session Dosage Given: 1.8 Gy
Reference Point Session Dosage Given: 1.8 Gy
Session Number: 13

## 2023-05-29 ENCOUNTER — Other Ambulatory Visit: Payer: Self-pay

## 2023-05-29 ENCOUNTER — Ambulatory Visit: Payer: 59 | Admitting: Radiation Oncology

## 2023-05-29 ENCOUNTER — Ambulatory Visit
Admission: RE | Admit: 2023-05-29 | Discharge: 2023-05-29 | Disposition: A | Discharge status: Home / Self Care | Payer: 59 | Source: Ambulatory Visit | Attending: Radiation Oncology | Admitting: Radiation Oncology

## 2023-05-29 ENCOUNTER — Ambulatory Visit
Admission: RE | Admit: 2023-05-29 | Discharge: 2023-05-29 | Disposition: A | Discharge status: Home / Self Care | Source: Ambulatory Visit | Attending: Radiation Oncology | Admitting: Radiation Oncology

## 2023-05-29 DIAGNOSIS — C50211 Malignant neoplasm of upper-inner quadrant of right female breast: Secondary | ICD-10-CM | POA: Diagnosis not present

## 2023-05-29 LAB — RAD ONC ARIA SESSION SUMMARY
Course Elapsed Days: 18
Plan Fractions Treated to Date: 14
Plan Fractions Treated to Date: 14
Plan Prescribed Dose Per Fraction: 1.8 Gy
Plan Prescribed Dose Per Fraction: 1.8 Gy
Plan Total Fractions Prescribed: 28
Plan Total Fractions Prescribed: 28
Plan Total Prescribed Dose: 50.4 Gy
Plan Total Prescribed Dose: 50.4 Gy
Reference Point Dosage Given to Date: 25.2 Gy
Reference Point Dosage Given to Date: 25.2 Gy
Reference Point Session Dosage Given: 1.8 Gy
Reference Point Session Dosage Given: 1.8 Gy
Session Number: 14

## 2023-06-01 ENCOUNTER — Other Ambulatory Visit: Payer: Self-pay

## 2023-06-01 ENCOUNTER — Ambulatory Visit
Admission: RE | Admit: 2023-06-01 | Discharge: 2023-06-01 | Disposition: A | Discharge status: Home / Self Care | Payer: 59 | Source: Ambulatory Visit | Attending: Radiation Oncology | Admitting: Radiation Oncology

## 2023-06-01 DIAGNOSIS — C50211 Malignant neoplasm of upper-inner quadrant of right female breast: Secondary | ICD-10-CM | POA: Diagnosis not present

## 2023-06-01 LAB — RAD ONC ARIA SESSION SUMMARY

## 2023-06-02 ENCOUNTER — Ambulatory Visit
Admission: RE | Admit: 2023-06-02 | Discharge: 2023-06-02 | Disposition: A | Discharge status: Home / Self Care | Payer: 59 | Source: Ambulatory Visit | Attending: Radiation Oncology | Admitting: Radiation Oncology

## 2023-06-02 ENCOUNTER — Other Ambulatory Visit: Payer: Self-pay

## 2023-06-02 DIAGNOSIS — C50211 Malignant neoplasm of upper-inner quadrant of right female breast: Secondary | ICD-10-CM | POA: Diagnosis not present

## 2023-06-02 LAB — RAD ONC ARIA SESSION SUMMARY
Course Elapsed Days: 22
Plan Fractions Treated to Date: 16
Plan Fractions Treated to Date: 16
Plan Prescribed Dose Per Fraction: 1.8 Gy
Plan Prescribed Dose Per Fraction: 1.8 Gy
Plan Total Fractions Prescribed: 28
Plan Total Fractions Prescribed: 28
Plan Total Prescribed Dose: 50.4 Gy
Plan Total Prescribed Dose: 50.4 Gy
Reference Point Dosage Given to Date: 28.8 Gy
Reference Point Dosage Given to Date: 28.8 Gy
Reference Point Session Dosage Given: 1.8 Gy
Reference Point Session Dosage Given: 1.8 Gy
Session Number: 16

## 2023-06-03 ENCOUNTER — Ambulatory Visit
Admission: RE | Admit: 2023-06-03 | Discharge: 2023-06-03 | Disposition: A | Discharge status: Home / Self Care | Payer: 59 | Source: Ambulatory Visit | Attending: Radiation Oncology | Admitting: Radiation Oncology

## 2023-06-03 ENCOUNTER — Other Ambulatory Visit: Payer: Self-pay

## 2023-06-03 DIAGNOSIS — C50211 Malignant neoplasm of upper-inner quadrant of right female breast: Secondary | ICD-10-CM | POA: Diagnosis not present

## 2023-06-03 LAB — RAD ONC ARIA SESSION SUMMARY
Course Elapsed Days: 23
Plan Fractions Treated to Date: 17
Plan Fractions Treated to Date: 17
Plan Prescribed Dose Per Fraction: 1.8 Gy
Plan Prescribed Dose Per Fraction: 1.8 Gy
Plan Total Fractions Prescribed: 28
Plan Total Fractions Prescribed: 28
Plan Total Prescribed Dose: 50.4 Gy
Plan Total Prescribed Dose: 50.4 Gy
Reference Point Dosage Given to Date: 30.6 Gy
Reference Point Dosage Given to Date: 30.6 Gy
Reference Point Session Dosage Given: 1.8 Gy
Reference Point Session Dosage Given: 1.8 Gy
Session Number: 17

## 2023-06-04 ENCOUNTER — Ambulatory Visit
Admission: RE | Admit: 2023-06-04 | Discharge: 2023-06-04 | Disposition: A | Discharge status: Home / Self Care | Payer: 59 | Source: Ambulatory Visit | Attending: Radiation Oncology | Admitting: Radiation Oncology

## 2023-06-04 ENCOUNTER — Other Ambulatory Visit: Payer: Self-pay

## 2023-06-04 DIAGNOSIS — C50211 Malignant neoplasm of upper-inner quadrant of right female breast: Secondary | ICD-10-CM | POA: Diagnosis not present

## 2023-06-04 LAB — RAD ONC ARIA SESSION SUMMARY
Course Elapsed Days: 24
Plan Fractions Treated to Date: 18
Plan Fractions Treated to Date: 18
Plan Prescribed Dose Per Fraction: 1.8 Gy
Plan Prescribed Dose Per Fraction: 1.8 Gy
Plan Total Fractions Prescribed: 28
Plan Total Fractions Prescribed: 28
Plan Total Prescribed Dose: 50.4 Gy
Plan Total Prescribed Dose: 50.4 Gy
Reference Point Dosage Given to Date: 32.4 Gy
Reference Point Dosage Given to Date: 32.4 Gy
Reference Point Session Dosage Given: 1.8 Gy
Reference Point Session Dosage Given: 1.8 Gy
Session Number: 18

## 2023-06-05 ENCOUNTER — Other Ambulatory Visit: Payer: Self-pay

## 2023-06-05 ENCOUNTER — Ambulatory Visit
Admission: RE | Admit: 2023-06-05 | Discharge: 2023-06-05 | Disposition: A | Discharge status: Home / Self Care | Source: Ambulatory Visit | Attending: Radiation Oncology | Admitting: Radiation Oncology

## 2023-06-05 ENCOUNTER — Ambulatory Visit
Admission: RE | Admit: 2023-06-05 | Discharge: 2023-06-05 | Disposition: A | Discharge status: Home / Self Care | Payer: 59 | Source: Ambulatory Visit | Attending: Radiation Oncology | Admitting: Radiation Oncology

## 2023-06-05 DIAGNOSIS — C50211 Malignant neoplasm of upper-inner quadrant of right female breast: Secondary | ICD-10-CM | POA: Diagnosis not present

## 2023-06-05 LAB — RAD ONC ARIA SESSION SUMMARY
Course Elapsed Days: 25
Plan Fractions Treated to Date: 19
Plan Fractions Treated to Date: 19
Plan Prescribed Dose Per Fraction: 1.8 Gy
Plan Prescribed Dose Per Fraction: 1.8 Gy
Plan Total Fractions Prescribed: 28
Plan Total Fractions Prescribed: 28
Plan Total Prescribed Dose: 50.4 Gy
Plan Total Prescribed Dose: 50.4 Gy
Reference Point Dosage Given to Date: 34.2 Gy
Reference Point Dosage Given to Date: 34.2 Gy
Reference Point Session Dosage Given: 1.8 Gy
Reference Point Session Dosage Given: 1.8 Gy
Session Number: 19

## 2023-06-06 ENCOUNTER — Telehealth: Payer: Self-pay | Admitting: Hematology and Oncology

## 2023-06-06 NOTE — Telephone Encounter (Signed)
 Spoke with patient confirming upcoming appointment

## 2023-06-07 DIAGNOSIS — C50211 Malignant neoplasm of upper-inner quadrant of right female breast: Secondary | ICD-10-CM | POA: Diagnosis not present

## 2023-06-08 ENCOUNTER — Other Ambulatory Visit: Payer: Self-pay

## 2023-06-08 ENCOUNTER — Ambulatory Visit
Admission: RE | Admit: 2023-06-08 | Discharge: 2023-06-08 | Disposition: A | Discharge status: Home / Self Care | Payer: 59 | Source: Ambulatory Visit | Attending: Radiation Oncology | Admitting: Radiation Oncology

## 2023-06-08 DIAGNOSIS — C50211 Malignant neoplasm of upper-inner quadrant of right female breast: Secondary | ICD-10-CM | POA: Diagnosis not present

## 2023-06-08 LAB — RAD ONC ARIA SESSION SUMMARY
Course Elapsed Days: 28
Plan Fractions Treated to Date: 20
Plan Fractions Treated to Date: 20
Plan Prescribed Dose Per Fraction: 1.8 Gy
Plan Prescribed Dose Per Fraction: 1.8 Gy
Plan Total Fractions Prescribed: 28
Plan Total Fractions Prescribed: 28
Plan Total Prescribed Dose: 50.4 Gy
Plan Total Prescribed Dose: 50.4 Gy
Reference Point Dosage Given to Date: 36 Gy
Reference Point Dosage Given to Date: 36 Gy
Reference Point Session Dosage Given: 1.8 Gy
Reference Point Session Dosage Given: 1.8 Gy
Session Number: 20

## 2023-06-09 ENCOUNTER — Other Ambulatory Visit: Payer: Self-pay

## 2023-06-09 ENCOUNTER — Ambulatory Visit
Admission: RE | Admit: 2023-06-09 | Discharge: 2023-06-09 | Disposition: A | Discharge status: Home / Self Care | Payer: Commercial Managed Care - PPO | Source: Ambulatory Visit | Attending: Radiation Oncology

## 2023-06-09 DIAGNOSIS — C50211 Malignant neoplasm of upper-inner quadrant of right female breast: Secondary | ICD-10-CM | POA: Insufficient documentation

## 2023-06-09 DIAGNOSIS — Z1721 Progesterone receptor positive status: Secondary | ICD-10-CM | POA: Diagnosis not present

## 2023-06-09 DIAGNOSIS — Z923 Personal history of irradiation: Secondary | ICD-10-CM | POA: Diagnosis not present

## 2023-06-09 DIAGNOSIS — Z170 Estrogen receptor positive status [ER+]: Secondary | ICD-10-CM | POA: Insufficient documentation

## 2023-06-09 DIAGNOSIS — Z1732 Human epidermal growth factor receptor 2 negative status: Secondary | ICD-10-CM | POA: Diagnosis not present

## 2023-06-09 LAB — RAD ONC ARIA SESSION SUMMARY
Course Elapsed Days: 29
Plan Fractions Treated to Date: 21
Plan Fractions Treated to Date: 21
Plan Prescribed Dose Per Fraction: 1.8 Gy
Plan Prescribed Dose Per Fraction: 1.8 Gy
Plan Total Fractions Prescribed: 28
Plan Total Fractions Prescribed: 28
Plan Total Prescribed Dose: 50.4 Gy
Plan Total Prescribed Dose: 50.4 Gy
Reference Point Dosage Given to Date: 37.8 Gy
Reference Point Dosage Given to Date: 37.8 Gy
Reference Point Session Dosage Given: 1.8 Gy
Reference Point Session Dosage Given: 1.8 Gy
Session Number: 21

## 2023-06-10 ENCOUNTER — Other Ambulatory Visit: Payer: Self-pay

## 2023-06-10 ENCOUNTER — Ambulatory Visit
Admission: RE | Admit: 2023-06-10 | Discharge: 2023-06-10 | Disposition: A | Discharge status: Home / Self Care | Payer: Commercial Managed Care - PPO | Source: Ambulatory Visit | Attending: Radiation Oncology | Admitting: Radiation Oncology

## 2023-06-10 DIAGNOSIS — C50211 Malignant neoplasm of upper-inner quadrant of right female breast: Secondary | ICD-10-CM | POA: Diagnosis not present

## 2023-06-10 LAB — RAD ONC ARIA SESSION SUMMARY
Course Elapsed Days: 30
Plan Fractions Treated to Date: 22
Plan Fractions Treated to Date: 22
Plan Prescribed Dose Per Fraction: 1.8 Gy
Plan Prescribed Dose Per Fraction: 1.8 Gy
Plan Total Fractions Prescribed: 28
Plan Total Fractions Prescribed: 28
Plan Total Prescribed Dose: 50.4 Gy
Plan Total Prescribed Dose: 50.4 Gy
Reference Point Dosage Given to Date: 39.6 Gy
Reference Point Dosage Given to Date: 39.6 Gy
Reference Point Session Dosage Given: 1.8 Gy
Reference Point Session Dosage Given: 1.8 Gy
Session Number: 22

## 2023-06-11 ENCOUNTER — Other Ambulatory Visit: Payer: Self-pay

## 2023-06-11 ENCOUNTER — Ambulatory Visit
Admission: RE | Admit: 2023-06-11 | Discharge: 2023-06-11 | Disposition: A | Discharge status: Home / Self Care | Payer: Commercial Managed Care - PPO | Source: Ambulatory Visit | Attending: Radiation Oncology | Admitting: Radiation Oncology

## 2023-06-11 DIAGNOSIS — C50211 Malignant neoplasm of upper-inner quadrant of right female breast: Secondary | ICD-10-CM | POA: Diagnosis not present

## 2023-06-11 LAB — RAD ONC ARIA SESSION SUMMARY
Course Elapsed Days: 31
Plan Fractions Treated to Date: 23
Plan Fractions Treated to Date: 23
Plan Prescribed Dose Per Fraction: 1.8 Gy
Plan Prescribed Dose Per Fraction: 1.8 Gy
Plan Total Fractions Prescribed: 28
Plan Total Fractions Prescribed: 28
Plan Total Prescribed Dose: 50.4 Gy
Plan Total Prescribed Dose: 50.4 Gy
Reference Point Dosage Given to Date: 41.4 Gy
Reference Point Dosage Given to Date: 41.4 Gy
Reference Point Session Dosage Given: 1.8 Gy
Reference Point Session Dosage Given: 1.8 Gy
Session Number: 23

## 2023-06-12 ENCOUNTER — Ambulatory Visit
Admission: RE | Admit: 2023-06-12 | Discharge: 2023-06-12 | Disposition: A | Discharge status: Home / Self Care | Source: Ambulatory Visit | Attending: Radiation Oncology | Admitting: Radiation Oncology

## 2023-06-12 ENCOUNTER — Ambulatory Visit
Admission: RE | Admit: 2023-06-12 | Discharge: 2023-06-12 | Disposition: A | Discharge status: Home / Self Care | Payer: Commercial Managed Care - PPO | Source: Ambulatory Visit | Attending: Radiation Oncology | Admitting: Radiation Oncology

## 2023-06-12 ENCOUNTER — Other Ambulatory Visit: Payer: Self-pay

## 2023-06-12 DIAGNOSIS — C50211 Malignant neoplasm of upper-inner quadrant of right female breast: Secondary | ICD-10-CM | POA: Diagnosis not present

## 2023-06-12 LAB — RAD ONC ARIA SESSION SUMMARY
Course Elapsed Days: 32
Plan Fractions Treated to Date: 24
Plan Fractions Treated to Date: 24
Plan Prescribed Dose Per Fraction: 1.8 Gy
Plan Prescribed Dose Per Fraction: 1.8 Gy
Plan Total Fractions Prescribed: 28
Plan Total Fractions Prescribed: 28
Plan Total Prescribed Dose: 50.4 Gy
Plan Total Prescribed Dose: 50.4 Gy
Reference Point Dosage Given to Date: 43.2 Gy
Reference Point Dosage Given to Date: 43.2 Gy
Reference Point Session Dosage Given: 1.8 Gy
Reference Point Session Dosage Given: 1.8 Gy
Session Number: 24

## 2023-06-15 ENCOUNTER — Ambulatory Visit
Admission: RE | Admit: 2023-06-15 | Discharge: 2023-06-15 | Disposition: A | Discharge status: Home / Self Care | Payer: Commercial Managed Care - PPO | Source: Ambulatory Visit | Attending: Radiation Oncology

## 2023-06-15 ENCOUNTER — Other Ambulatory Visit: Payer: Self-pay

## 2023-06-15 DIAGNOSIS — C50211 Malignant neoplasm of upper-inner quadrant of right female breast: Secondary | ICD-10-CM | POA: Diagnosis not present

## 2023-06-15 LAB — RAD ONC ARIA SESSION SUMMARY
Course Elapsed Days: 35
Plan Fractions Treated to Date: 25
Plan Fractions Treated to Date: 25
Plan Prescribed Dose Per Fraction: 1.8 Gy
Plan Prescribed Dose Per Fraction: 1.8 Gy
Plan Total Fractions Prescribed: 28
Plan Total Fractions Prescribed: 28
Plan Total Prescribed Dose: 50.4 Gy
Plan Total Prescribed Dose: 50.4 Gy
Reference Point Dosage Given to Date: 45 Gy
Reference Point Dosage Given to Date: 45 Gy
Reference Point Session Dosage Given: 1.8 Gy
Reference Point Session Dosage Given: 1.8 Gy
Session Number: 25

## 2023-06-16 ENCOUNTER — Other Ambulatory Visit: Payer: Self-pay

## 2023-06-16 ENCOUNTER — Ambulatory Visit
Admission: RE | Admit: 2023-06-16 | Discharge: 2023-06-16 | Disposition: A | Discharge status: Home / Self Care | Payer: Commercial Managed Care - PPO | Source: Ambulatory Visit | Attending: Radiation Oncology

## 2023-06-16 DIAGNOSIS — C50211 Malignant neoplasm of upper-inner quadrant of right female breast: Secondary | ICD-10-CM | POA: Diagnosis not present

## 2023-06-16 LAB — RAD ONC ARIA SESSION SUMMARY
Course Elapsed Days: 36
Plan Fractions Treated to Date: 26
Plan Fractions Treated to Date: 26
Plan Prescribed Dose Per Fraction: 1.8 Gy
Plan Prescribed Dose Per Fraction: 1.8 Gy
Plan Total Fractions Prescribed: 28
Plan Total Fractions Prescribed: 28
Plan Total Prescribed Dose: 50.4 Gy
Plan Total Prescribed Dose: 50.4 Gy
Reference Point Dosage Given to Date: 46.8 Gy
Reference Point Dosage Given to Date: 46.8 Gy
Reference Point Session Dosage Given: 1.8 Gy
Reference Point Session Dosage Given: 1.8 Gy
Session Number: 26

## 2023-06-17 ENCOUNTER — Ambulatory Visit
Admission: RE | Admit: 2023-06-17 | Discharge: 2023-06-17 | Disposition: A | Discharge status: Home / Self Care | Payer: Commercial Managed Care - PPO | Source: Ambulatory Visit | Attending: Radiation Oncology

## 2023-06-17 ENCOUNTER — Other Ambulatory Visit: Payer: Self-pay

## 2023-06-17 DIAGNOSIS — C50211 Malignant neoplasm of upper-inner quadrant of right female breast: Secondary | ICD-10-CM | POA: Diagnosis not present

## 2023-06-17 LAB — RAD ONC ARIA SESSION SUMMARY
Course Elapsed Days: 37
Plan Fractions Treated to Date: 27
Plan Fractions Treated to Date: 27
Plan Prescribed Dose Per Fraction: 1.8 Gy
Plan Prescribed Dose Per Fraction: 1.8 Gy
Plan Total Fractions Prescribed: 28
Plan Total Fractions Prescribed: 28
Plan Total Prescribed Dose: 50.4 Gy
Plan Total Prescribed Dose: 50.4 Gy
Reference Point Dosage Given to Date: 48.6 Gy
Reference Point Dosage Given to Date: 48.6 Gy
Reference Point Session Dosage Given: 1.8 Gy
Reference Point Session Dosage Given: 1.8 Gy
Session Number: 27

## 2023-06-18 ENCOUNTER — Other Ambulatory Visit: Payer: Self-pay

## 2023-06-18 ENCOUNTER — Ambulatory Visit: Payer: Commercial Managed Care - PPO

## 2023-06-18 DIAGNOSIS — C50211 Malignant neoplasm of upper-inner quadrant of right female breast: Secondary | ICD-10-CM | POA: Diagnosis not present

## 2023-06-18 LAB — RAD ONC ARIA SESSION SUMMARY
Course Elapsed Days: 38
Plan Fractions Treated to Date: 28
Plan Fractions Treated to Date: 28
Plan Prescribed Dose Per Fraction: 1.8 Gy
Plan Prescribed Dose Per Fraction: 1.8 Gy
Plan Total Fractions Prescribed: 28
Plan Total Fractions Prescribed: 28
Plan Total Prescribed Dose: 50.4 Gy
Plan Total Prescribed Dose: 50.4 Gy
Reference Point Dosage Given to Date: 50.4 Gy
Reference Point Dosage Given to Date: 50.4 Gy
Reference Point Session Dosage Given: 1.8 Gy
Reference Point Session Dosage Given: 1.8 Gy
Session Number: 28

## 2023-06-19 ENCOUNTER — Ambulatory Visit
Admission: RE | Admit: 2023-06-19 | Discharge: 2023-06-19 | Disposition: A | Discharge status: Home / Self Care | Source: Ambulatory Visit | Attending: Radiation Oncology | Admitting: Radiation Oncology

## 2023-06-19 ENCOUNTER — Ambulatory Visit: Payer: Commercial Managed Care - PPO

## 2023-06-19 ENCOUNTER — Other Ambulatory Visit: Payer: Self-pay

## 2023-06-19 DIAGNOSIS — C50211 Malignant neoplasm of upper-inner quadrant of right female breast: Secondary | ICD-10-CM | POA: Diagnosis not present

## 2023-06-19 LAB — RAD ONC ARIA SESSION SUMMARY
Course Elapsed Days: 39
Plan Fractions Treated to Date: 1
Plan Prescribed Dose Per Fraction: 2 Gy
Plan Total Fractions Prescribed: 5
Plan Total Prescribed Dose: 10 Gy
Reference Point Dosage Given to Date: 2 Gy
Reference Point Session Dosage Given: 2 Gy
Session Number: 29

## 2023-06-22 ENCOUNTER — Ambulatory Visit
Admission: RE | Admit: 2023-06-22 | Discharge: 2023-06-22 | Disposition: A | Discharge status: Home / Self Care | Payer: Commercial Managed Care - PPO | Source: Ambulatory Visit | Attending: Radiation Oncology | Admitting: Radiation Oncology

## 2023-06-22 ENCOUNTER — Other Ambulatory Visit: Payer: Self-pay

## 2023-06-22 DIAGNOSIS — C50211 Malignant neoplasm of upper-inner quadrant of right female breast: Secondary | ICD-10-CM | POA: Diagnosis not present

## 2023-06-22 LAB — RAD ONC ARIA SESSION SUMMARY
Course Elapsed Days: 42
Plan Fractions Treated to Date: 2
Plan Prescribed Dose Per Fraction: 2 Gy
Plan Total Fractions Prescribed: 5
Plan Total Prescribed Dose: 10 Gy
Reference Point Dosage Given to Date: 4 Gy
Reference Point Session Dosage Given: 2 Gy
Session Number: 30

## 2023-06-23 ENCOUNTER — Other Ambulatory Visit: Payer: Self-pay

## 2023-06-23 ENCOUNTER — Ambulatory Visit
Admission: RE | Admit: 2023-06-23 | Discharge: 2023-06-23 | Disposition: A | Discharge status: Home / Self Care | Payer: Commercial Managed Care - PPO | Source: Ambulatory Visit | Attending: Radiation Oncology | Admitting: Radiation Oncology

## 2023-06-23 DIAGNOSIS — C50211 Malignant neoplasm of upper-inner quadrant of right female breast: Secondary | ICD-10-CM | POA: Diagnosis not present

## 2023-06-23 LAB — RAD ONC ARIA SESSION SUMMARY
Course Elapsed Days: 43
Plan Fractions Treated to Date: 3
Plan Prescribed Dose Per Fraction: 2 Gy
Plan Total Fractions Prescribed: 5
Plan Total Prescribed Dose: 10 Gy
Reference Point Dosage Given to Date: 6 Gy
Reference Point Session Dosage Given: 2 Gy
Session Number: 31

## 2023-06-24 ENCOUNTER — Ambulatory Visit
Admission: RE | Admit: 2023-06-24 | Discharge: 2023-06-24 | Disposition: A | Discharge status: Home / Self Care | Source: Ambulatory Visit | Attending: Radiation Oncology | Admitting: Radiation Oncology

## 2023-06-24 ENCOUNTER — Ambulatory Visit: Payer: Commercial Managed Care - PPO

## 2023-06-24 ENCOUNTER — Other Ambulatory Visit: Payer: Self-pay

## 2023-06-24 DIAGNOSIS — C50211 Malignant neoplasm of upper-inner quadrant of right female breast: Secondary | ICD-10-CM | POA: Diagnosis not present

## 2023-06-24 LAB — RAD ONC ARIA SESSION SUMMARY
Course Elapsed Days: 44
Plan Fractions Treated to Date: 4
Plan Prescribed Dose Per Fraction: 2 Gy
Plan Total Fractions Prescribed: 5
Plan Total Prescribed Dose: 10 Gy
Reference Point Dosage Given to Date: 8 Gy
Reference Point Session Dosage Given: 2 Gy
Session Number: 32

## 2023-06-25 ENCOUNTER — Inpatient Hospital Stay

## 2023-06-25 ENCOUNTER — Inpatient Hospital Stay: Attending: Hematology and Oncology | Admitting: Hematology and Oncology

## 2023-06-25 ENCOUNTER — Ambulatory Visit
Admission: RE | Admit: 2023-06-25 | Discharge: 2023-06-25 | Disposition: A | Discharge status: Home / Self Care | Source: Ambulatory Visit | Attending: Radiation Oncology | Admitting: Radiation Oncology

## 2023-06-25 ENCOUNTER — Other Ambulatory Visit: Payer: Self-pay

## 2023-06-25 ENCOUNTER — Ambulatory Visit

## 2023-06-25 VITALS — BP 146/79 | HR 89 | Temp 98.2°F | Resp 18 | Ht 67.0 in | Wt 264.0 lb

## 2023-06-25 DIAGNOSIS — Z170 Estrogen receptor positive status [ER+]: Secondary | ICD-10-CM | POA: Diagnosis not present

## 2023-06-25 DIAGNOSIS — Z923 Personal history of irradiation: Secondary | ICD-10-CM | POA: Insufficient documentation

## 2023-06-25 DIAGNOSIS — Z1732 Human epidermal growth factor receptor 2 negative status: Secondary | ICD-10-CM | POA: Insufficient documentation

## 2023-06-25 DIAGNOSIS — Z1721 Progesterone receptor positive status: Secondary | ICD-10-CM | POA: Insufficient documentation

## 2023-06-25 DIAGNOSIS — C50211 Malignant neoplasm of upper-inner quadrant of right female breast: Secondary | ICD-10-CM | POA: Insufficient documentation

## 2023-06-25 LAB — RAD ONC ARIA SESSION SUMMARY
Course Elapsed Days: 45
Plan Fractions Treated to Date: 5
Plan Prescribed Dose Per Fraction: 2 Gy
Plan Total Fractions Prescribed: 5
Plan Total Prescribed Dose: 10 Gy
Reference Point Dosage Given to Date: 10 Gy
Reference Point Session Dosage Given: 2 Gy
Session Number: 33

## 2023-06-25 NOTE — Progress Notes (Signed)
 Patient Care Team: Annamaria Helling, DO as PCP - General (Family Medicine) Himmelrich, Loree Fee, RD (Inactive) as Dietitian Donnelly Angelica, RN as Oncology Nurse Navigator Pershing Proud, RN as Oncology Nurse Navigator Emelia Loron, MD as Consulting Physician (General Surgery) Serena Croissant, MD as Consulting Physician (Hematology and Oncology) [name], MD as Consulting Physician (Radiation Oncology)  DIAGNOSIS:  Encounter Diagnosis  Name Primary?   Malignant neoplasm of upper-inner quadrant of right breast in female, estrogen receptor positive (HCC) Yes    SUMMARY OF ONCOLOGIC HISTORY: Oncology History  Malignant neoplasm of upper-inner quadrant of right breast in female, estrogen receptor positive (HCC)  03/12/2023 Initial Diagnosis   Screening mammogram detected right breast asymmetry at 1 o'clock position measuring 0.6 cm.  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, Ki-67 5%   03/18/2023 Cancer Staging   Staging form: Breast, AJCC 8th Edition - Clinical stage from 03/18/2023: Stage IA (cT1b, cN0, cM0, G2, ER+, PR+, HER2-) - Signed by Ronny Bacon, PA-C on 03/18/2023 Stage prefix: Initial diagnosis Method of lymph node assessment: Clinical Histologic grading system: 3 grade system   03/27/2023 Genetic Testing   Negative [facility] CancerNext+RNAinsight Panel.  Report date is 03/27/2023.   The [employer] CancerNext+RNAinsight Panel includes sequencing, rearrangement analysis, and RNA analysis for the following 39 genes: APC, ATM, BAP1, BARD1, BMPR1A, BRCA1, BRCA2, BRIP1, CDH1, CDKN2A, CHEK2, FH, FLCN, MET, MLH1, MSH2, MSH6, MUTYH, NF1, NTHL1, PALB2, PMS2, PTEN, RAD51C, RAD51D, SMAD4, STK11, TP53, TSC1, TSC2, and VHL (sequencing and deletion/duplication); AXIN2, HOXB13, MBD4, MSH3, POLD1 and POLE (sequencing only); EPCAM and GREM1 (deletion/duplication only).   04/27/2023 Cancer Staging   Staging form: Breast, AJCC  8th Edition - Pathologic stage from 04/27/2023: Stage IA (pT1b, pN1(sn), cM0, G2, ER+, PR+, HER2-, Oncotype DX score: 19) - Signed by Ronny Bacon, PA-C on 04/27/2023 Stage prefix: Initial diagnosis Method of lymph node assessment: Sentinel lymph node biopsy Multigene prognostic tests performed: Oncotype DX Recurrence score range: Greater than or equal to 11 Histologic grading system: 3 grade system   05/12/2023 - 06/25/2023 Radiation Therapy   Adjuvant radiation   07/09/2023 -  Anti-estrogen oral therapy   Antiestrogen therapy with anastrozole     CHIEF COMPLIANT:   HISTORY OF PRESENT ILLNESS: Discussed the use of AI scribe software for clinical note transcription with the patient, who gave verbal consent to proceed.  History of Present Illness The patient, with a history of cancer, recently completed radiation therapy. She reports tolerating the treatment well, with skin changes including 'heat bumps' and peeling only appearing during the last week of treatment. The skin is now starting to heal. The patient has a history of hysterectomy but retained her ovaries. Her menopausal status is unclear, necessitating further investigation.     ALLERGIES:  is allergic to nitrous oxide.  MEDICATIONS:  Current Outpatient Medications  Medication Sig Dispense Refill   Calcium Carb-Cholecalciferol (CALCIUM CARBONATE+VITAMIN D PO) Take 2 tablets by mouth daily. Calcium 500 mg with 25 mcg Vitamin D3     cetirizine (ZYRTEC) 10 MG tablet Take 10 mg by mouth daily.     Cyanocobalamin (VITAMIN B-12) 500 MCG SUBL Place 1 tablet under the tongue every morning.     OVER THE COUNTER MEDICATION Take 3 tablets by mouth daily. Fiber Gummies     OVER THE COUNTER MEDICATION Take 1.5 mLs by mouth daily. Soursop     venlafaxine XR (EFFEXOR-XR) 75 MG 24 hr  capsule Take 75 mg by mouth daily.     ZEPBOUND 2.5 MG/0.5ML Pen SMARTSIG:1 unspecified SUB-Q Once a Week     No current facility-administered  medications for this visit.    PHYSICAL EXAMINATION: ECOG PERFORMANCE STATUS: 1 - Symptomatic but completely ambulatory  Vitals:   06/25/23 1140  BP: (!) 146/79  Pulse: 89  Resp: 18  Temp: 98.2 F (36.8 C)  SpO2: 98%   Filed Weights   06/25/23 1140  Weight: 264 lb (119.7 kg)    Physical Exam   (exam performed in the presence of a chaperone)  LABORATORY DATA:  I have reviewed the data as listed    Latest Ref Rng & Units 03/18/2023   12:01 PM 03/12/2022    2:21 PM 05/16/2014    9:04 AM  CMP  Glucose 70 - 99 mg/dL 409  811  914   BUN 6 - 20 mg/dL 9  11  21    Creatinine 0.44 - 1.00 mg/dL 7.82  9.56  2.13   Sodium 135 - 145 mmol/L 138  138  140   Potassium 3.5 - 5.1 mmol/L 4.0  3.6  3.5   Chloride 98 - 111 mmol/L 106  102  109   CO2 22 - 32 mmol/L 24  24  23    Calcium 8.9 - 10.3 mg/dL 9.5  8.9  8.6   Total Protein 6.5 - 8.1 g/dL 7.6  7.6  6.8   Total Bilirubin 0.0 - 1.2 mg/dL 0.6  0.4  0.7   Alkaline Phos 38 - 126 U/L 75  69  55   AST 15 - 41 U/L 13  32  14   ALT 0 - 44 U/L 13  26  25      Lab Results  Component Value Date   WBC 10.0 03/18/2023   HGB 14.4 03/18/2023   HCT 42.6 03/18/2023   MCV 84.0 03/18/2023   PLT 341 03/18/2023   NEUTROABS 7.2 03/18/2023    ASSESSMENT & PLAN:  Malignant neoplasm of upper-inner quadrant of right breast in female, estrogen receptor positive (HCC) 03/12/2023:Screening mammogram detected right breast asymmetry at 1 o'clock position measuring 0.6 cm.  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, Ki-67 5%    Recommendations: 04/01/2023: Right Lumpectomy: Grade 2 IDC 0.8 cm, 1/2 LN Positive, Margins clear: Stage Ia 2. Oncotype DX testing 04/15/2023: Score 19 (distant recurrence at 5 years: 5%) 3. Adjuvant radiation therapy 05/12/2023-06/25/2023 4. Adjuvant antiestrogen therapy 07/09/2023 anastrozole versus tamoxifen based upon menopausal  status ------------------------------------------------------------------------------------------------------------- I discussed with her about risks and benefits of anastrozole as well as tamoxifen. Patient had a partial hysterectomy a while ago and has not had menstrual cycles. Telephone with her in 1 week to discuss the results of FSH and estradiol and determine which medication she needs to be on.    Assessment & Plan Malignant neoplasm of upper-inner quadrant of right breast, estrogen receptor positive Completed radiation therapy. Post-radiation skin peeling expected to resolve in two weeks. Antiestrogen therapy choice depends on menopausal status. Tamoxifen for premenopausal, anastrozole for postmenopausal. Both may cause vasomotor symptoms and mood disturbances. Long-term management includes bone health monitoring. - Order blood test for estradiol and FSH to assess ovarian function. - Discuss results in a week to determine antiestrogen therapy. - Educated on side effects of tamoxifen (vasomotor symptoms, mood disturbances, thromboembolism) and anastrozole (vasomotor symptoms, mood disturbances). - Continue vitamin D supplementation, consider calcium for bone health. -  Schedule biennial bone density test.      No orders of the defined types were placed in this encounter.  The patient has a good understanding of the overall plan. she agrees with it. she will call with any problems that may develop before the next visit here. Total time spent: 30 mins including face to face time and time spent for planning, charting and co-ordination of care   Viinay K Mellisa Arshad, MD 06/25/23

## 2023-06-25 NOTE — Assessment & Plan Note (Signed)
 03/12/2023:Screening mammogram detected right breast asymmetry at 1 o'clock position measuring 0.6 cm.  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, Ki-67 5%    Recommendations: 04/01/2023: Right Lumpectomy: Grade 2 IDC 0.8 cm, 1/2 LN Positive, Margins clear: Stage Ia 2. Oncotype DX testing 04/15/2023: Score 19 (distant recurrence at 5 years: 5%) 3. Adjuvant radiation therapy 05/12/2023-06/25/2023 4. Adjuvant antiestrogen therapy 07/09/2023 ------------------------------------------------------------------------------------------------------------- Anastrozole counseling: We discussed the risks and benefits of anti-estrogen therapy with aromatase inhibitors. These include but not limited to insomnia, hot flashes, mood changes, vaginal dryness, bone density loss, and weight gain. We strongly believe that the benefits far outweigh the risks. Patient understands these risks and consented to starting treatment. Planned treatment duration is 5-7 years.  Return to clinic in 3 months for survivorship care plan was

## 2023-06-26 ENCOUNTER — Telehealth: Payer: Self-pay | Admitting: Hematology and Oncology

## 2023-06-26 LAB — FOLLICLE STIMULATING HORMONE: FSH: 30 m[IU]/mL

## 2023-06-26 NOTE — Telephone Encounter (Signed)
 Confirmed with pt scheduled appt date and time

## 2023-06-26 NOTE — Radiation Completion Notes (Signed)
  Radiation Oncology         [phone] ________________________________  Name: Darlene Patterson MRN: [id number]  Date of Service: 06/25/2023  DOB: 1970-06-25  End of Treatment Note   Diagnosis: Stage IA, pT1bN1M0, grade 2, ER/PR positive invasive ductal carcinoma of the right breast   Intent: Curative     ==========DELIVERED PLANS==========  First Treatment Date: 2023-05-11 Last Treatment Date: 2023-06-25   Plan Name: Breast_R Site: Breast, Right Technique: 3D Mode: Photon Dose Per Fraction: 1.8 Gy Prescribed Dose (Delivered / Prescribed): 50.4 Gy / 50.4 Gy Prescribed Fxs (Delivered / Prescribed): 28 / 28   Plan Name: SCV_PAB_R Site: Breast, Right Technique: 3D Mode: Photon Dose Per Fraction: 1.8 Gy Prescribed Dose (Delivered / Prescribed): 50.4 Gy / 50.4 Gy Prescribed Fxs (Delivered / Prescribed): 28 / 28   Plan Name: Breast_R_Bst Site: Breast, Right Technique: 3D Mode: Photon Dose Per Fraction: 2 Gy Prescribed Dose (Delivered / Prescribed): 10 Gy / 10 Gy Prescribed Fxs (Delivered / Prescribed): 5 / 5     ==========ON TREATMENT VISIT DATES========== 2023-05-18, 2023-05-22, 2023-05-29, 2023-06-05, 2023-06-12, 2023-06-19, 2023-06-25    See weekly On Treatment Notes in Epic for details in the Media tab (listed as Progress notes on the On Treatment Visit Dates listed above). The patient tolerated radiation. She developed fatigue and anticipated skin changes in the treatment field.   The patient will receive a call in about one month from the radiation oncology department. She will continue follow up with Dr. Gudena as well.      Shelvia Dick, PAC

## 2023-07-01 ENCOUNTER — Ambulatory Visit: Payer: Self-pay

## 2023-07-02 ENCOUNTER — Ambulatory Visit: Payer: Commercial Managed Care - PPO | Attending: General Surgery

## 2023-07-02 VITALS — Wt 265.0 lb

## 2023-07-02 DIAGNOSIS — C50211 Malignant neoplasm of upper-inner quadrant of right female breast: Secondary | ICD-10-CM | POA: Insufficient documentation

## 2023-07-02 DIAGNOSIS — Z483 Aftercare following surgery for neoplasm: Secondary | ICD-10-CM | POA: Insufficient documentation

## 2023-07-02 DIAGNOSIS — Z170 Estrogen receptor positive status [ER+]: Secondary | ICD-10-CM | POA: Insufficient documentation

## 2023-07-02 DIAGNOSIS — R293 Abnormal posture: Secondary | ICD-10-CM | POA: Insufficient documentation

## 2023-07-02 DIAGNOSIS — M25611 Stiffness of right shoulder, not elsewhere classified: Secondary | ICD-10-CM | POA: Insufficient documentation

## 2023-07-02 LAB — ESTRADIOL, ULTRA SENS: Estradiol, Sensitive: 47.6 pg/mL

## 2023-07-02 NOTE — Therapy (Signed)
 OUTPATIENT PHYSICAL THERAPY SOZO SCREENING NOTE   Patient Name: Darlene Patterson MRN: [id number] DOB:[birth year], 53 y.o., female Today's Date: 07/02/2023  PCP: Russell Court, DO REFERRING PROVIDER: Enid Harry, MD   PT End of Session - 07/02/23 1216     Visit Number 7   # unchanged due to screen only   PT Start Time 1058    PT Stop Time 1103    PT Time Calculation (min) 5 min    Activity Tolerance Patient tolerated treatment well    Behavior During Therapy [facility] for tasks assessed/performed             Past Medical History:  Diagnosis Date   Anxiety    with anesthesia   Cancer (HCC) 03/2023   right breast IDC   Complication of anesthesia    Great toe pain    cyst left great toe- to be excised 05-24-14- [place]   H/O hiatal hernia    per upper GI 3/13   Heart murmur    early teens- resolved   History of kidney stones    Obesity    Peripheral vascular disease (HCC)    varicose veins- left   PONV (postoperative nausea and vomiting)    'SEVERE PER PT"   Status post gastric banding    Past Surgical History:  Procedure Laterality Date   ABDOMINAL HYSTERECTOMY  3/13   ACHILLES TENDON REPAIR  08, 09   bilateral   BREAST BIOPSY Right 03/12/2023   US  RT BREAST BX W LOC DEV 1ST LESION IMG BX SPEC US  GUIDE 03/12/2023 [facility]   BREAST BIOPSY  03/31/2023   MM RT RADIOACTIVE SEED LOC MAMMO GUIDE 03/31/2023 [facility]   BREAST LUMPECTOMY WITH RADIOACTIVE SEED AND SENTINEL LYMPH NODE BIOPSY Right 04/01/2023   Procedure: RIGHT BREAST SEED GUIDED LUMPECTOMY AND RIGHT AXILLARY SENTINEL NODE BIOPSY;  Surgeon: Enid Harry, MD;  Location: [facility];  Service: General;  Laterality: Right;  pec block   BREATH TEK H PYLORI  05/21/2011   Procedure: BREATH TEK H PYLORI;  Surgeon: Azucena Bollard, MD;  Location: [facility];  Service: General;  Laterality: N/A;   CESAREAN SECTION  89, 93   CHOLECYSTECTOMY  2011   CYSTOSCOPY W/ URETERAL  STENT PLACEMENT Left 05/16/2014   Procedure: CYSTOSCOPY WITH RETROGRADE PYELOGRAM/URETERAL STENT PLACEMENT;  Surgeon: Andrez Banker, MD;  Location: [facility];  Service: Urology;  Laterality: Left;   CYSTOSCOPY WITH URETEROSCOPY AND STENT PLACEMENT Left 05/31/2014   Procedure: LEFT URETEROSCOPY AND STENT EXCHANGE, LEFT RETROGRADE;  Surgeon: Andrez Banker, MD;  Location: [facility];  Service: Urology;  Laterality: Left;   EYE SURGERY  75,85   repair lazy eye   HIATAL HERNIA REPAIR  10/07/2011   Procedure: LAPAROSCOPIC REPAIR OF HIATAL HERNIA;  Surgeon: Azucena Bollard, MD;  Location: [facility];  Service: General;  Laterality: N/A;   LAPAROSCOPIC GASTRIC BANDING  10/07/2011   Procedure: LAPAROSCOPIC GASTRIC BANDING;  Surgeon: Azucena Bollard, MD;  Location: [facility];  Service: General;  Laterality: N/A;   VASCULAR SURGERY     left leg- s/p  injections of veins   Patient Active Problem List   Diagnosis Date Noted   Genetic testing 03/30/2023   Malignant neoplasm of upper-inner quadrant of right breast in female, estrogen receptor positive (HCC) 03/17/2023   Lapband APS + [facility] repair July 2013 10/08/2011   Obesity BMI 41 05/01/2011    REFERRING DIAG: right breast cancer at risk for lymphedema  THERAPY DIAG: Aftercare following surgery for neoplasm  PERTINENT HISTORY: Patient was diagnosed on 02/13/2023 with 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 She underwent a right lumpectomy and sentinel node biopsy on 04/01/2023 with 1 of 2 lymph nodes positive for carcinoma. It is ER/PR positive and HER2 negative with a Ki67 of 5%  PRECAUTIONS: right UE Lymphedema risk, None  SUBJECTIVE: Pt returns for her first 3 month L-Dex screen.   PAIN:  Are you having pain? No  SOZO SCREENING: Patient was assessed today using the SOZO machine to determine the lymphedema index score. This was compared to her baseline score. It was determined that she is within the recommended range when compared to her baseline and  no further action is needed at this time. She will continue SOZO screenings. These are done every 3 months for 2 years post operatively followed by every 6 months for 2 years, and then annually.   L-DEX FLOWSHEETS - 07/02/23 1200       L-DEX LYMPHEDEMA SCREENING   Measurement Type Unilateral    L-DEX MEASUREMENT EXTREMITY Upper Extremity    POSITION  Standing    DOMINANT SIDE Right    At Risk Side Right    BASELINE SCORE (UNILATERAL) -1.5    L-DEX SCORE (UNILATERAL) -0.4    VALUE CHANGE (UNILAT) 1.1              Denyce Flank, PTA 07/02/2023, 12:20 PM

## 2023-07-06 ENCOUNTER — Ambulatory Visit: Admitting: Rehabilitation

## 2023-07-06 ENCOUNTER — Encounter: Payer: Self-pay | Admitting: Rehabilitation

## 2023-07-06 DIAGNOSIS — M25611 Stiffness of right shoulder, not elsewhere classified: Secondary | ICD-10-CM

## 2023-07-06 DIAGNOSIS — R293 Abnormal posture: Secondary | ICD-10-CM | POA: Diagnosis present

## 2023-07-06 DIAGNOSIS — Z483 Aftercare following surgery for neoplasm: Secondary | ICD-10-CM | POA: Diagnosis present

## 2023-07-06 DIAGNOSIS — C50211 Malignant neoplasm of upper-inner quadrant of right female breast: Secondary | ICD-10-CM

## 2023-07-06 DIAGNOSIS — Z170 Estrogen receptor positive status [ER+]: Secondary | ICD-10-CM | POA: Diagnosis present

## 2023-07-06 NOTE — Therapy (Signed)
 OUTPATIENT PHYSICAL THERAPY BREAST CANCER TREATMENT   Patient Name: Darlene Patterson MRN: [id number] DOB:May 15, 1970, 53 y.o., female Today's Date: 07/06/2023  END OF SESSION:  PT End of Session - 07/06/23 1130     Visit Number 8    Number of Visits 10    PT Start Time 1100    PT Stop Time 1125    PT Time Calculation (min) 25 min    Activity Tolerance Patient tolerated treatment well    Behavior During Therapy WFL for tasks assessed/performed               Past Medical History:  Diagnosis Date   Anxiety    with anesthesia   Cancer (HCC) 03/2023   right breast IDC   Complication of anesthesia    Great toe pain    cyst left great toe- to be excised 05-24-14- [place]   H/O hiatal hernia    per upper GI 3/13   Heart murmur    early teens- resolved   History of kidney stones    Obesity    Peripheral vascular disease (HCC)    varicose veins- left   PONV (postoperative nausea and vomiting)    'SEVERE PER PT"   Status post gastric banding    Past Surgical History:  Procedure Laterality Date   ABDOMINAL HYSTERECTOMY  3/13   ACHILLES TENDON REPAIR  08, 09   bilateral   BREAST BIOPSY Right 03/12/2023   US  RT BREAST BX W LOC DEV 1ST LESION IMG BX SPEC US  GUIDE 03/12/2023 [facility]   BREAST BIOPSY  03/31/2023   MM RT RADIOACTIVE SEED LOC MAMMO GUIDE 03/31/2023 [facility]   BREAST LUMPECTOMY WITH RADIOACTIVE SEED AND SENTINEL LYMPH NODE BIOPSY Right 04/01/2023   Procedure: RIGHT BREAST SEED GUIDED LUMPECTOMY AND RIGHT AXILLARY SENTINEL NODE BIOPSY;  Surgeon: Enid Harry, MD;  Location: [facility];  Service: General;  Laterality: Right;  pec block   BREATH TEK H PYLORI  05/21/2011   Procedure: BREATH TEK H PYLORI;  Surgeon: Azucena Bollard, MD;  Location: [facility];  Service: General;  Laterality: N/A;   CESAREAN SECTION  89, 93   CHOLECYSTECTOMY  2011   CYSTOSCOPY W/ URETERAL STENT PLACEMENT Left 05/16/2014   Procedure: CYSTOSCOPY WITH  RETROGRADE PYELOGRAM/URETERAL STENT PLACEMENT;  Surgeon: Andrez Banker, MD;  Location: [facility];  Service: Urology;  Laterality: Left;   CYSTOSCOPY WITH URETEROSCOPY AND STENT PLACEMENT Left 05/31/2014   Procedure: LEFT URETEROSCOPY AND STENT EXCHANGE, LEFT RETROGRADE;  Surgeon: Andrez Banker, MD;  Location: [facility];  Service: Urology;  Laterality: Left;   EYE SURGERY  75,85   repair lazy eye   HIATAL HERNIA REPAIR  10/07/2011   Procedure: LAPAROSCOPIC REPAIR OF HIATAL HERNIA;  Surgeon: Azucena Bollard, MD;  Location: [facility];  Service: General;  Laterality: N/A;   LAPAROSCOPIC GASTRIC BANDING  10/07/2011   Procedure: LAPAROSCOPIC GASTRIC BANDING;  Surgeon: Azucena Bollard, MD;  Location: [facility];  Service: General;  Laterality: N/A;   VASCULAR SURGERY     left leg- s/p  injections of veins   Patient Active Problem List   Diagnosis Date Noted   Genetic testing 03/30/2023   Malignant neoplasm of upper-inner quadrant of right breast in female, estrogen receptor positive (HCC) 03/17/2023   Lapband APS + [facility] repair July 2013 10/08/2011   Obesity BMI 41 05/01/2011    REFERRING PROVIDER: Dr. Enid Harry  REFERRING DIAG: Right breast cancer  THERAPY DIAG:  Aftercare following surgery for neoplasm  Stiffness of right shoulder, not elsewhere classified  Abnormal posture  Malignant neoplasm of upper-inner quadrant of right breast in female, estrogen receptor positive (HCC)  Rationale for Evaluation and Treatment: Rehabilitation  ONSET DATE: 04/01/2023  SUBJECTIVE:                                                                                                                                                                                           SUBJECTIVE STATEMENT Honestly I don't have the pain anymore.  I don't notice cording.    EVAL: Patient reports she underwent a right lumpectomy and sentinel node biopsy on 04/01/2023 with 1 of 2 lymph nodes positive for carcinoma.Her  Oncotype score was low so she will proceed with radiation on 05/11/2023 and anti-estrogen therapy.  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 She underwent a right lumpectomy and sentinel node biopsy on 04/01/2023 with 1 of 2 lymph nodes positive for carcinoma. It is ER/PR positive and HER2 negative with a Ki67 of 5%.   PATIENT GOALS:  Reassess how my recovery is going related to arm function, pain, and swelling.  PAIN:  Are you having pain? No  PRECAUTIONS: Recent Surgery, right UE Lymphedema risk  RED FLAGS: None   ACTIVITY LEVEL / LEISURE: She walks 3x/week for 20 min each time   OBJECTIVE:   OBSERVATIONS: Very easily palpable axillary cording present from axilla to medial mid upper arm. Negative neural tension test; incisions healed well with no signs of redness, swelling, or infection. 07/06/23: signs of post radiation discoloration and some peeling at the axilla and inferior breast, no cording   POSTURE:  Forward head and rounded shoulders posture  LYMPHEDEMA ASSESSMENT:   UPPER EXTREMITY AROM/PROM:   A/PROM RIGHT   eval   RIGHT 05/04/2023 07/06/23  Shoulder extension 45 54 60  Shoulder flexion 161 149 165  Shoulder abduction 166 160 168  Shoulder internal rotation 76 60 75  Shoulder external rotation 88 90 90                          (Blank rows = not tested)   A/PROM LEFT   eval  Shoulder extension 48  Shoulder flexion 152  Shoulder abduction 168  Shoulder internal rotation 79  Shoulder external rotation 88                          (Blank rows = not tested)   CERVICAL AROM: All within normal limits   UPPER EXTREMITY  STRENGTH: WNL   LYMPHEDEMA ASSESSMENTS (in cm):    LANDMARK RIGHT   eval RIGHT 05/04/2023  10 cm proximal to olecranon process 36.6 35.8  Olecranon process 29.7 29.6  10 cm proximal to ulnar styloid process 25.1 24.6  Just proximal to ulnar styloid process 18.6 18.5  Across hand  at thumb web space 20.3 20.7  At base of 2nd digit 7.1 6.9  (Blank rows = not tested)   LANDMARK LEFT   eval LEFT 05/04/2023  10 cm proximal to olecranon process 37 36.3  Olecranon process 29 29.5  10 cm proximal to ulnar styloid process 25.5 25.2  Just proximal to ulnar styloid process 19.2 18.7  Across hand at thumb web space 20.6 19.6  At base of 2nd digit 7.4 7.2  (Blank rows = not tested)  Surgery type/Date: 04/01/2023 right lumpectomy and sentinel node biopsy Number of lymph nodes removed: 2 Current/past treatment (chemo, radiation, hormone therapy): none Other symptoms:  Heaviness/tightness Yes Pain Yes Pitting edema No Infections No Decreased scar mobility Yes Stemmer sign No   TREATMENT TODAY 07/06/23 Re-eval performed after radiation due to change in status.  Reviewed when to return to PT and how to keep stretching and using self massage for at least the next 6 months  05/21/2023 Therapeutic Exercise Ball rolls on wall x 10 flexion and abduction Supine over half foam roll for following: Bil UE horz abd with yellow band x 10 reps, bilateral ER with yellow band x 10 reps, bilateral diagonals x 10 reps, bilateral flexion with narrow and wide grip x 10 reps each all with yellow band- pt performed all without difficulty MANUAL Checked axilla, upper arm and forearm for cording. MFR to upper arm and forearm cords very palpable but not very visible. 1 pop noted at upper arm MLD to Rt breast as follows: Short neck, 5 diaphragmatic breaths, Lt axilla , anterior inter-axillary anastamosis, then Rt breast, focusing on medial breast where peau d'orange present and mild fibrosis palpated, then finished retracing steps in supine.  05/19/23: Therapeutic Exercises Pulleys into flex and abd x 2 mins each with demo and VC's to decrease Rt scapular compensation Supine over half foam roll for following: Bil UE horz abd with yellow band x 10 reps, bilateral ER with yellow band x 10 reps,  bilateral diagonals x 10 reps, bilateral flexion with narrow and wide grip x 10 reps each all with yellow band- pt returned therapist demo Manual Therapy MLD to Rt breast as follows: Short neck, 5 diaphragmatic breaths, Lt axilla and Rt inguinal nodes, anterior inter-axillary anastomosis, Rt axillo-inguinal anastomosis, then Rt breast, mostly focusing on medial breast where peau d'orange present and mild fibrosis palpated, then finished retracing steps in supine. Educated pt and spouse in proper technique throughout.  MFR at R forearm which was the only place cording could be palpated today  05/15/23: Therapeutic Exercises Pulleys into flex and abd x 2 mins each with demo and VC's to decrease Rt scapular compensation Roll yellow ball up wall into flex, and Rt abd x 10 each Supine over half foam roll for following: Bil UE horz abd and then bil UE scaption into a "V" x 10 each, then bil UE abd into a "snow angel" with 5 sec holds, x 10 returning therapist demo Manual Therapy MLD to Rt breast as follows: Short neck, 5 diaphragmatic breaths, Lt axilla and Rt inguinal nodes, anterior inter-axillary anastomosis, Rt axillo-inguinal anastomosis, then Rt breast, mostly focusing on medial breast where peau d'orange  present and mild fibrosis palpated, then finished retracing steps in supine. Reviewed with pt and issued handout instructing her that she could see increased swelling after radiation. MFR from Rt axilla to medial upper arm over area of cording that was mildly palpated today. Pt reports this is improving greatly P/ROM in supine to Rt shoulder for MFR into flex, abd and D2 with scapular depression throughout  05/13/23: Self Care Noted increased edema at medial aspect of breast with mild fibrosis and peau d'orange so included MLD to medial breast today with stretching. Instructed pt in basics of anatomy of lymphatic system and principles of MLD.  Manual Therapy MLD to Rt breast as follows: Short neck,  superficial and deep abdominals, Lt axilla and Rt inguinal nodes, anterior inter-axillary anastomosis, Rt axillo-inguinal anastomosis, then Rt breast, mostly focusing on medial breast where peau d'orange present and mild fibrosis palpated, then briefly into Lt S/L for focus to lateral breast where some fullness palpated, then finished retracing steps in supine.  Made a small chip pack for pt to wear at medial breast in her compression bra P/ROM of Rt shoulder into flex and abd  05/07/2023 Pulleys x 2 min flex, scaption, abduction with VC's to depress scapula Ball rolls on wall x 10 forward and ab x 5 Wall stretches in varying positions to stretch cording with UE ext overhead and wrist extension oscillations MFR to axilla, upper arm and forearm cording PROM right shoulder flex, scaption, abduction, ER    PATIENT EDUCATION:  Education details: HEP; closed chain flexion and abduction to gain last degrees of flexion and abduction. Spent time educating her and her partner [name] on scar massage, the pathology and treatment of axillary cording, and lymphedema risk reduction practices.  Person educated: Patient and Spouse Education method: Medical illustrator Education comprehension: verbalized understanding and returned demonstration  HOME EXERCISE PROGRAM: Reviewed previously given post op HEP. Rt breast MLD Supine scap series  ASSESSMENT:  CLINICAL IMPRESSION: Pt reports no tightness from cording and that she is having minimal pain overall.  She tolerated radiation well but was encouraged to keep stretching and doing self MLD to minimize side effects.  All of her goals are met and she will continue with SOZO screenings.   Pt will benefit from skilled therapeutic intervention to improve on the following deficits: Decreased knowledge of precautions, impaired UE functional use, pain, decreased ROM, postural dysfunction.   PT treatment/interventions: ADL/Self care home management, [phone]  PT Re-evaluation, 97110-Therapeutic exercises, 97530- Therapeutic activity, [id number]- Neuromuscular re-education, [phone]- Self Care, 82956- Manual therapy, Patient/Family education, and Scar mobilization   GOALS: Goals reviewed with patient? Yes  LONG TERM GOALS:  (STG=LTG)  GOALS Name Target Date  Goal status  1 Pt will demonstrate she has regained full shoulder ROM and function post operatively compared to baselines.  Baseline: 06/01/2023 MET  2 Patient will report >/= 50% less pain with daily tasks in her axilla and medial upper arm. 06/01/2023 MET 05/21/2023  3 Patient will improve her DASH score to be </= 8 for improved overall UE function. 06/01/2023 MET  4 Patient will be able to verbalize good understanding of lymphedema risk reduction practices. 06/01/2023 MET     PLAN:  PT FREQUENCY/DURATION: 2x/week for up to 4 weeks  PLAN FOR NEXT SESSION: reassess post radiation or sooner if pt requires. On hold, Myofascial release and PROM to improve axillary cording; review Rt breast MLD prn   [facility]  [address]  [place]  [address]  [phone]  PHYSICAL THERAPY DISCHARGE SUMMARY  Visits from Start of Care: 8  Current functional level related to goals / functional outcomes: All goals met   Remaining deficits: Lymphedema risk    Education / Equipment: Final HEP  Plan: Patient agrees to discharge.   Patient is being discharged due to meeting the stated rehab goals.

## 2023-07-16 ENCOUNTER — Inpatient Hospital Stay: Attending: Hematology and Oncology | Admitting: Hematology and Oncology

## 2023-07-16 DIAGNOSIS — Z170 Estrogen receptor positive status [ER+]: Secondary | ICD-10-CM

## 2023-07-16 DIAGNOSIS — C50211 Malignant neoplasm of upper-inner quadrant of right female breast: Secondary | ICD-10-CM

## 2023-07-16 DIAGNOSIS — Z1732 Human epidermal growth factor receptor 2 negative status: Secondary | ICD-10-CM

## 2023-07-16 DIAGNOSIS — Z1721 Progesterone receptor positive status: Secondary | ICD-10-CM | POA: Diagnosis not present

## 2023-07-16 DIAGNOSIS — Z923 Personal history of irradiation: Secondary | ICD-10-CM

## 2023-07-16 MED ORDER — TAMOXIFEN CITRATE 20 MG PO TABS: 20.0000 mg | ORAL_TABLET | Freq: Every day | ORAL | 3 refills | Status: AC

## 2023-07-16 NOTE — Progress Notes (Signed)
 HEMATOLOGY-ONCOLOGY TELEPHONE VISIT PROGRESS NOTE  I connected with our patient on 07/16/23 at  1:45 PM EDT by telephone and verified that I am speaking with the correct person using two identifiers.  I discussed the limitations, risks, security and privacy concerns of performing an evaluation and management service by telephone and the availability of in person appointments.  I also discussed with the patient that there may be a patient responsible charge related to this service. The patient expressed understanding and agreed to proceed.   History of Present Illness:    History of Present Illness Darlene Patterson "Breezy" is a 53 year old female who presents for follow-up regarding menopausal status and tamoxifen prescription.  Her estradiol  level is 47.6, below the premenopausal range but not yet indicative of menopause. Her FSH level is 30, approaching the menopausal range but not yet diagnostic. She does not have a uterus, only ovaries, which is relevant to her treatment plan.    Oncology History  Malignant neoplasm of upper-inner quadrant of right breast in female, estrogen receptor positive (HCC)  03/12/2023 Initial Diagnosis   Screening mammogram detected right breast asymmetry at 1 o'clock position measuring 0.6 cm.  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, Ki-67 5%   03/18/2023 Cancer Staging   Staging form: Breast, AJCC 8th Edition - Clinical stage from 03/18/2023: Stage IA (cT1b, cN0, cM0, G2, ER+, PR+, HER2-) - Signed by Bettejane Brownie, PA-C on 03/18/2023 Stage prefix: Initial diagnosis Method of lymph node assessment: Clinical Histologic grading system: 3 grade system   03/27/2023 Genetic Testing   Negative [facility] CancerNext+RNAinsight Panel.  Report date is 03/27/2023.   The [employer] CancerNext+RNAinsight Panel includes sequencing, rearrangement analysis, and RNA analysis for the following 39 genes: APC, ATM, BAP1,  BARD1, BMPR1A, BRCA1, BRCA2, BRIP1, CDH1, CDKN2A, CHEK2, FH, FLCN, MET, MLH1, MSH2, MSH6, MUTYH, NF1, NTHL1, PALB2, PMS2, PTEN, RAD51C, RAD51D, SMAD4, STK11, TP53, TSC1, TSC2, and VHL (sequencing and deletion/duplication); AXIN2, HOXB13, MBD4, MSH3, POLD1 and POLE (sequencing only); EPCAM and GREM1 (deletion/duplication only).   04/27/2023 Cancer Staging   Staging form: Breast, AJCC 8th Edition - Pathologic stage from 04/27/2023: Stage IA (pT1b, pN1(sn), cM0, G2, ER+, PR+, HER2-, Oncotype DX score: 19) - Signed by Bettejane Brownie, PA-C on 04/27/2023 Stage prefix: Initial diagnosis Method of lymph node assessment: Sentinel lymph node biopsy Multigene prognostic tests performed: Oncotype DX Recurrence score range: Greater than or equal to 11 Histologic grading system: 3 grade system   05/12/2023 - 06/25/2023 Radiation Therapy   Adjuvant radiation   07/09/2023 -  Anti-estrogen oral therapy   Antiestrogen therapy with anastrozole     REVIEW OF SYSTEMS:   Constitutional: Denies fevers, chills or abnormal weight loss All other systems were reviewed with the patient and are negative. Observations/Objective:     Assessment Plan:  Malignant neoplasm of upper-inner quadrant of right breast in female, estrogen receptor positive (HCC) 03/12/2023:Screening mammogram detected right breast asymmetry at 1 o'clock position measuring 0.6 cm.  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, Ki-67 5%    Recommendations: 04/01/2023: Right Lumpectomy: Grade 2 IDC 0.8 cm, 1/2 LN Positive, Margins clear: Stage Ia 2. Oncotype DX testing 04/15/2023: Score 19 (distant recurrence at 5 years: 5%) 3. Adjuvant radiation therapy 05/12/2023-06/25/2023 4. Adjuvant antiestrogen therapy 07/09/2023 anastrozole versus tamoxifen based upon menopausal  status ----------------------------------------------------------------------------------------------------------- 06/25/2023: Estradiol  47.6, FSH 30 Based on the above  results patient is not in menopause.  Therefore I would like to send a prescription for tamoxifen.  We will recheck her labs again in 1 year  I sent a prescription for Tamoxifen Tamoxifen counseling: We discussed the risks and benefits of tamoxifen. These include but not limited to insomnia, hot flashes, mood changes, vaginal dryness, and weight gain. Although rare, serious side effects including endometrial cancer, risk of blood clots were also discussed. We strongly believe that the benefits far outweigh the risks. Patient understands these risks and consented to starting treatment.   Check menopause status in 6 months to a year and consider switching if she's in menopause to Anastrozole  Return to clinic in 3 months for survivorship care plan visit  Assessment & Plan Malignant neoplasm of upper-inner quadrant of right breast, estrogen receptor positive Premenopausal status confirmed by estradiol  and FSH levels. Tamoxifen indicated for estrogen receptor-positive cancer. Discussed potential side effects and thromboembolism risk. Informed consent obtained. - Prescribe tamoxifen 90-day supply to [facility] in [place]. - Monitor for side effects: hot flashes, muscle cramps, leg swelling. - Instruct to call if leg swelling occurs. - Schedule follow-up in three months with nurse practitioner Loris Ros to assess tamoxifen response and discuss survivorship.      I discussed the assessment and treatment plan with the patient. The patient was provided an opportunity to ask questions and all were answered. The patient agreed with the plan and demonstrated an understanding of the instructions. The patient was advised to call back or seek an in-person evaluation if the symptoms worsen or if the condition fails to improve as anticipated.    I provided 20 minutes of non-face-to-face time during this encounter.  This includes time for charting and coordination of care   Margert Sheerer, MD

## 2023-07-16 NOTE — Assessment & Plan Note (Signed)
 03/12/2023:Screening mammogram detected right breast asymmetry at 1 o'clock position measuring 0.6 cm.  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, Ki-67 5%    Recommendations: 04/01/2023: Right Lumpectomy: Grade 2 IDC 0.8 cm, 1/2 LN Positive, Margins clear: Stage Ia 2. Oncotype DX testing 04/15/2023: Score 19 (distant recurrence at 5 years: 5%) 3. Adjuvant radiation therapy 05/12/2023-06/25/2023 4. Adjuvant antiestrogen therapy 07/09/2023 anastrozole versus tamoxifen based upon menopausal status ----------------------------------------------------------------------------------------------------------- 06/25/2023: Estradiol  47.6, FSH 30 Based on the above results patient is not in menopause.  Therefore I would like to send a prescription for tamoxifen.  We will recheck her labs again in 1 year  Return to clinic in 3 months for survivorship care plan visit

## 2023-07-24 NOTE — Progress Notes (Signed)
  Radiation Oncology         [phone]) [phone] ________________________________  Name: Darlene Patterson MRN: [id number]  Date of Service: 07/24/2023  DOB: Oct 04, 1970  Post Treatment Telephone Note  Diagnosis:  Stage IA, pT1bN1M0, grade 2, ER/PR positive invasive ductal carcinoma of the right breast (as documented in provider EOT note)  The patient was not available for call today. Voicemail left. Call marked complete.   The patient was encouraged to avoid sun exposure in the area of prior treatment for up to one year following radiation with either sunscreen or by the style of clothing worn in the sun.  The patient has scheduled follow up with her medical oncologist Dr. Gudena for ongoing surveillance, and was encouraged to call if she develops concerns or questions regarding radiation.   Avery Bodo, LPN

## 2023-07-27 ENCOUNTER — Ambulatory Visit
Admission: RE | Admit: 2023-07-27 | Discharge: 2023-07-27 | Disposition: A | Discharge status: Home / Self Care | Source: Ambulatory Visit | Attending: Adult Health | Admitting: Adult Health

## 2023-07-27 DIAGNOSIS — C50211 Malignant neoplasm of upper-inner quadrant of right female breast: Secondary | ICD-10-CM | POA: Insufficient documentation

## 2023-07-27 DIAGNOSIS — Z170 Estrogen receptor positive status [ER+]: Secondary | ICD-10-CM | POA: Insufficient documentation

## 2023-10-05 ENCOUNTER — Ambulatory Visit: Attending: General Surgery

## 2023-10-05 VITALS — Wt 258.0 lb

## 2023-10-05 DIAGNOSIS — Z483 Aftercare following surgery for neoplasm: Secondary | ICD-10-CM | POA: Insufficient documentation

## 2023-10-05 NOTE — Therapy (Signed)
 OUTPATIENT PHYSICAL THERAPY SOZO SCREENING NOTE   Patient Name: Darlene Patterson MRN: [id number] DOB:12/14/1970, 53 y.o., female Today's Date: 10/05/2023  PCP: Conley Dene BROCKS, DO REFERRING PROVIDER: Ebbie Cough, MD   PT End of Session - 10/05/23 1051     Visit Number 8   # unchanged due to screen only   PT Start Time 1049    PT Stop Time 1053    PT Time Calculation (min) 4 min    Activity Tolerance Patient tolerated treatment well    Behavior During Therapy [facility] for tasks assessed/performed          Past Medical History:  Diagnosis Date   Anxiety    with anesthesia   Cancer (HCC) 03/2023   right breast IDC   Complication of anesthesia    Great toe pain    cyst left great toe- to be excised 05-24-14- [place]   H/O hiatal hernia    per upper GI 3/13   Heart murmur    early teens- resolved   History of kidney stones    Obesity    Peripheral vascular disease (HCC)    varicose veins- left   PONV (postoperative nausea and vomiting)    'SEVERE PER PT   Status post gastric banding    Past Surgical History:  Procedure Laterality Date   ABDOMINAL HYSTERECTOMY  3/13   ACHILLES TENDON REPAIR  08, 09   bilateral   BREAST BIOPSY Right 03/12/2023   US  RT BREAST BX W LOC DEV 1ST LESION IMG BX SPEC US  GUIDE 03/12/2023 [facility]   BREAST BIOPSY  03/31/2023   MM RT RADIOACTIVE SEED LOC MAMMO GUIDE 03/31/2023 [facility]   BREAST LUMPECTOMY WITH RADIOACTIVE SEED AND SENTINEL LYMPH NODE BIOPSY Right 04/01/2023   Procedure: RIGHT BREAST SEED GUIDED LUMPECTOMY AND RIGHT AXILLARY SENTINEL NODE BIOPSY;  Surgeon: Ebbie Cough, MD;  Location: [facility];  Service: General;  Laterality: Right;  pec block   BREATH TEK H PYLORI  05/21/2011   Procedure: BREATH TEK H PYLORI;  Surgeon: Cough KATHEE Lunger, MD;  Location: [facility];  Service: General;  Laterality: N/A;   CESAREAN SECTION  89, 93   CHOLECYSTECTOMY  2011   CYSTOSCOPY W/ URETERAL STENT  PLACEMENT Left 05/16/2014   Procedure: CYSTOSCOPY WITH RETROGRADE PYELOGRAM/URETERAL STENT PLACEMENT;  Surgeon: Morene LELON Salines, MD;  Location: [facility];  Service: Urology;  Laterality: Left;   CYSTOSCOPY WITH URETEROSCOPY AND STENT PLACEMENT Left 05/31/2014   Procedure: LEFT URETEROSCOPY AND STENT EXCHANGE, LEFT RETROGRADE;  Surgeon: Morene LELON Salines, MD;  Location: [facility];  Service: Urology;  Laterality: Left;   EYE SURGERY  75,85   repair lazy eye   HIATAL HERNIA REPAIR  10/07/2011   Procedure: LAPAROSCOPIC REPAIR OF HIATAL HERNIA;  Surgeon: Cough KATHEE Lunger, MD;  Location: [facility];  Service: General;  Laterality: N/A;   LAPAROSCOPIC GASTRIC BANDING  10/07/2011   Procedure: LAPAROSCOPIC GASTRIC BANDING;  Surgeon: Cough KATHEE Lunger, MD;  Location: [facility];  Service: General;  Laterality: N/A;   VASCULAR SURGERY     left leg- s/p  injections of veins   Patient Active Problem List   Diagnosis Date Noted   Genetic testing 03/30/2023   Malignant neoplasm of upper-inner quadrant of right breast in female, estrogen receptor positive (HCC) 03/17/2023   Lapband APS + [facility] repair July 2013 10/08/2011   Obesity BMI 41 05/01/2011    REFERRING DIAG: right breast cancer at risk for lymphedema  THERAPY DIAG:  Aftercare following surgery for neoplasm  PERTINENT HISTORY: Patient was diagnosed on 02/13/2023 with 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 She underwent a right lumpectomy and sentinel node biopsy on 04/01/2023 with 1 of 2 lymph nodes positive for carcinoma. It is ER/PR positive and HER2 negative with a Ki67 of 5%.   PRECAUTIONS: right UE Lymphedema risk, None  SUBJECTIVE: Pt returns for her 3 month L-Dex screen.   PAIN:  Are you having pain? No  SOZO SCREENING: Patient was assessed today using the SOZO machine to determine the lymphedema index score. This was compared to her baseline score. It was determined that she is within the recommended range when compared to her baseline and no  further action is needed at this time. She will continue SOZO screenings. These are done every 3 months for 2 years post operatively followed by every 6 months for 2 years, and then annually.   L-DEX FLOWSHEETS - 10/05/23 1000       L-DEX LYMPHEDEMA SCREENING   Measurement Type Unilateral    L-DEX MEASUREMENT EXTREMITY Upper Extremity    POSITION  Standing    DOMINANT SIDE Right    At Risk Side Right    BASELINE SCORE (UNILATERAL) -1.5    L-DEX SCORE (UNILATERAL) 1.2    VALUE CHANGE (UNILAT) 2.7            Aden Berwyn Caldron, PTA 10/05/2023, 10:55 AM

## 2023-10-26 ENCOUNTER — Encounter: Admitting: Adult Health

## 2023-10-26 ENCOUNTER — Encounter: Payer: Self-pay | Admitting: Adult Health

## 2023-10-26 ENCOUNTER — Inpatient Hospital Stay: Attending: Adult Health | Admitting: Adult Health

## 2023-10-26 VITALS — BP 146/84 | HR 81 | Temp 98.1°F | Resp 17 | Wt 259.6 lb

## 2023-10-26 DIAGNOSIS — Z923 Personal history of irradiation: Secondary | ICD-10-CM | POA: Diagnosis not present

## 2023-10-26 DIAGNOSIS — Z170 Estrogen receptor positive status [ER+]: Secondary | ICD-10-CM | POA: Insufficient documentation

## 2023-10-26 DIAGNOSIS — Z90710 Acquired absence of both cervix and uterus: Secondary | ICD-10-CM | POA: Diagnosis not present

## 2023-10-26 DIAGNOSIS — Z79810 Long term (current) use of selective estrogen receptor modulators (SERMs): Secondary | ICD-10-CM | POA: Diagnosis not present

## 2023-10-26 DIAGNOSIS — C50211 Malignant neoplasm of upper-inner quadrant of right female breast: Secondary | ICD-10-CM | POA: Insufficient documentation

## 2023-10-26 DIAGNOSIS — Z1732 Human epidermal growth factor receptor 2 negative status: Secondary | ICD-10-CM | POA: Diagnosis not present

## 2023-10-26 DIAGNOSIS — Z1721 Progesterone receptor positive status: Secondary | ICD-10-CM | POA: Diagnosis not present

## 2023-10-26 NOTE — Progress Notes (Signed)
 SURVIVORSHIP VISIT:  BRIEF ONCOLOGIC HISTORY:  Oncology History  Malignant neoplasm of upper-inner quadrant of right breast in female, estrogen receptor positive (HCC)  03/12/2023 Initial Diagnosis   Screening mammogram detected right breast asymmetry at 1 o'clock position measuring 0.6 cm.  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, Ki-67 5%   03/18/2023 Cancer Staging   Staging form: Breast, AJCC 8th Edition - Clinical stage from 03/18/2023: Stage IA (cT1b, cN0, cM0, G2, ER+, PR+, HER2-) - Signed by Lanell Donald Stagger, PA-C on 03/18/2023 Stage prefix: Initial diagnosis Method of lymph node assessment: Clinical Histologic grading system: 3 grade system   03/27/2023 Genetic Testing   Negative [facility] CancerNext+RNAinsight Panel.  Report date is 03/27/2023.   The [facility] CancerNext+RNAinsight Panel includes sequencing, rearrangement analysis, and RNA analysis for the following 39 genes: APC, ATM, BAP1, BARD1, BMPR1A, BRCA1, BRCA2, BRIP1, CDH1, CDKN2A, CHEK2, FH, FLCN, MET, MLH1, MSH2, MSH6, MUTYH, NF1, NTHL1, PALB2, PMS2, PTEN, RAD51C, RAD51D, SMAD4, STK11, TP53, TSC1, TSC2, and VHL (sequencing and deletion/duplication); AXIN2, HOXB13, MBD4, MSH3, POLD1 and POLE (sequencing only); EPCAM and GREM1 (deletion/duplication only).   04/01/2023 Surgery   Right Lumpectomy: Grade 2 IDC 0.8 cm, 1/2 LN Positive, Margins clear: Stage Ia    04/15/2023 Oncotype testing   Oncotype DX testing: Score 19 (distant recurrence at 5 years: 5%)   04/27/2023 Cancer Staging   Staging form: Breast, AJCC 8th Edition - Pathologic stage from 04/27/2023: Stage IA (pT1b, pN1(sn), cM0, G2, ER+, PR+, HER2-, Oncotype DX score: 19) - Signed by Lanell Donald Stagger, PA-C on 04/27/2023 Stage prefix: Initial diagnosis Method of lymph node assessment: Sentinel lymph node biopsy Multigene prognostic tests performed: Oncotype DX Recurrence score range: Greater than or equal  to 11 Histologic grading system: 3 grade system   05/12/2023 - 06/25/2023 Radiation Therapy   Plan Name: Breast_R Site: Breast, Right Technique: 3D Mode: Photon Dose Per Fraction: 1.8 Gy Prescribed Dose (Delivered / Prescribed): 50.4 Gy / 50.4 Gy Prescribed Fxs (Delivered / Prescribed): 28 / 28   Plan Name: SCV_PAB_R Site: Breast, Right Technique: 3D Mode: Photon Dose Per Fraction: 1.8 Gy Prescribed Dose (Delivered / Prescribed): 50.4 Gy / 50.4 Gy Prescribed Fxs (Delivered / Prescribed): 28 / 28   Plan Name: Breast_R_Bst Site: Breast, Right Technique: 3D Mode: Photon Dose Per Fraction: 2 Gy Prescribed Dose (Delivered / Prescribed): 10 Gy / 10 Gy Prescribed Fxs (Delivered / Prescribed): 5 / 5   07/09/2023 -  Anti-estrogen oral therapy   Antiestrogen therapy with Tamoxifen      INTERVAL HISTORY:   Discussed the use of AI scribe software for clinical note transcription with the patient, who gave verbal consent to proceed.  History of Present Illness Madge Therrien is a 53 year old female with breast cancer who presents for a follow-up regarding treatment side effects and recurrence monitoring.  She notices fluid buildup at the previous lumpectomy site, which feels rough and occasionally disappears. She is concerned about cancer recurrence despite previous clean surgical margins.  She takes tamoxifen  daily and experiences hot flashes, particularly when stressed, and mood changes. .  Radiation therapy has resulted in skin discoloration and breast swelling, with the area appearing pink and brownish, similar to a sunburn. She experiences occasional twinges at the lumpectomy site. She performs exercises to mitigate the risk of lymphedema.  Her past medical history includes a partial hysterectomy, leaving her with only her ovaries. She is aware of the potential side  effects of tamoxifen , including mood changes and the risk of blood clots, and engages in regular exercise to  mitigate these risks.    REVIEW OF SYSTEMS:  Review of Systems  Constitutional:  Negative for appetite change, chills, fatigue, fever and unexpected weight change.  HENT:   Negative for hearing loss, lump/mass and trouble swallowing.   Eyes:  Negative for eye problems and icterus.  Respiratory:  Negative for chest tightness, cough and shortness of breath.   Cardiovascular:  Negative for chest pain, leg swelling and palpitations.  Gastrointestinal:  Negative for abdominal distention, abdominal pain, constipation, diarrhea, nausea and vomiting.  Endocrine: Negative for hot flashes.  Genitourinary:  Negative for difficulty urinating.   Musculoskeletal:  Negative for arthralgias.  Skin:  Negative for itching and rash.  Neurological:  Negative for dizziness, extremity weakness, headaches and numbness.  Hematological:  Negative for adenopathy. Does not bruise/bleed easily.  Psychiatric/Behavioral:  Negative for depression. The patient is not nervous/anxious.        PAST MEDICAL/SURGICAL HISTORY:  Past Medical History:  Diagnosis Date   Anxiety    with anesthesia   Cancer (HCC) 03/2023   right breast IDC   Complication of anesthesia    Great toe pain    cyst left great toe- to be excised 05-24-14- [place]   H/O hiatal hernia    per upper GI 3/13   Heart murmur    early teens- resolved   History of kidney stones    Obesity    Peripheral vascular disease (HCC)    varicose veins- left   PONV (postoperative nausea and vomiting)    'SEVERE PER PT   Status post gastric banding    Past Surgical History:  Procedure Laterality Date   ABDOMINAL HYSTERECTOMY  3/13   ACHILLES TENDON REPAIR  08, 09   bilateral   BREAST BIOPSY Right 03/12/2023   US  RT BREAST BX W LOC DEV 1ST LESION IMG BX SPEC US  GUIDE 03/12/2023 [facility]   BREAST BIOPSY  03/31/2023   MM RT RADIOACTIVE SEED LOC MAMMO GUIDE 03/31/2023 [facility]   BREAST LUMPECTOMY WITH RADIOACTIVE SEED AND SENTINEL  LYMPH NODE BIOPSY Right 04/01/2023   Procedure: RIGHT BREAST SEED GUIDED LUMPECTOMY AND RIGHT AXILLARY SENTINEL NODE BIOPSY;  Surgeon: Ebbie Cough, MD;  Location: [facility];  Service: General;  Laterality: Right;  pec block   BREATH TEK H PYLORI  05/21/2011   Procedure: BREATH TEK H PYLORI;  Surgeon: Cough KATHEE Lunger, MD;  Location: [facility];  Service: General;  Laterality: N/A;   CESAREAN SECTION  89, 93   CHOLECYSTECTOMY  2011   CYSTOSCOPY W/ URETERAL STENT PLACEMENT Left 05/16/2014   Procedure: CYSTOSCOPY WITH RETROGRADE PYELOGRAM/URETERAL STENT PLACEMENT;  Surgeon: Morene LELON Salines, MD;  Location: [facility];  Service: Urology;  Laterality: Left;   CYSTOSCOPY WITH URETEROSCOPY AND STENT PLACEMENT Left 05/31/2014   Procedure: LEFT URETEROSCOPY AND STENT EXCHANGE, LEFT RETROGRADE;  Surgeon: Morene LELON Salines, MD;  Location: [facility];  Service: Urology;  Laterality: Left;   EYE SURGERY  75,85   repair lazy eye   HIATAL HERNIA REPAIR  10/07/2011   Procedure: LAPAROSCOPIC REPAIR OF HIATAL HERNIA;  Surgeon: Cough KATHEE Lunger, MD;  Location: [facility];  Service: General;  Laterality: N/A;   LAPAROSCOPIC GASTRIC BANDING  10/07/2011   Procedure: LAPAROSCOPIC GASTRIC BANDING;  Surgeon: Cough KATHEE Lunger, MD;  Location: [facility];  Service: General;  Laterality: N/A;   VASCULAR SURGERY     left  leg- s/p  injections of veins     ALLERGIES:  Allergies  Allergen Reactions   Nitrous Oxide Nausea And Vomiting     CURRENT MEDICATIONS:  Outpatient Encounter Medications as of 10/26/2023  Medication Sig   Calcium Carb-Cholecalciferol (CALCIUM CARBONATE+VITAMIN D PO) Take 2 tablets by mouth daily. Calcium 500 mg with 25 mcg Vitamin D3   cetirizine (ZYRTEC) 10 MG tablet Take 10 mg by mouth daily.   Cyanocobalamin (VITAMIN B-12) 500 MCG SUBL Place 1 tablet under the tongue every morning.   OVER THE COUNTER MEDICATION Take 3 tablets by mouth daily. Fiber Gummies   OVER THE COUNTER MEDICATION  Take 1.5 mLs by mouth daily. Soursop   tamoxifen  (NOLVADEX ) 20 MG tablet Take 1 tablet (20 mg total) by mouth daily.   venlafaxine XR (EFFEXOR-XR) 75 MG 24 hr capsule Take 75 mg by mouth daily.   ZEPBOUND 2.5 MG/0.5ML Pen SMARTSIG:1 unspecified SUB-Q Once a Week (Patient taking differently: 7.5 mg.)   No facility-administered encounter medications on file as of 10/26/2023.     ONCOLOGIC FAMILY HISTORY:  Family History  Problem Relation Age of Onset   COPD Mother    Hypertension Sister    Breast cancer Neg Hx      SOCIAL HISTORY:  Social History   Socioeconomic History   Marital status: Married    Spouse name: Not on file   Number of children: Not on file   Years of education: Not on file   Highest education level: Not on file  Occupational History   Not on file  Tobacco Use   Smoking status: Never   Smokeless tobacco: Never  Substance and Sexual Activity   Alcohol use: Yes    Comment: 2-3 beers on 2 weekends/month   Drug use: No   Sexual activity: Yes    Birth control/protection: Surgical    Comment: hyst  Other Topics Concern   Not on file  Social History Narrative   Not on file   Social Drivers of Health   Financial Resource Strain: Not on file  Food Insecurity: No Food Insecurity (03/18/2023)   Hunger Vital Sign    Worried About Running Out of Food in the Last Year: Never true    Ran Out of Food in the Last Year: Never true  Transportation Needs: No Transportation Needs (03/18/2023)   PRAPARE - Administrator, Civil Service (Medical): No    Lack of Transportation (Non-Medical): No  Physical Activity: Not on file  Stress: Not on file  Social Connections: Unknown (07/23/2021)   [facility]   Social Network    Social Network: Not on file  Intimate Partner Violence: Not At Risk (03/18/2023)   Humiliation, Afraid, Rape, and Kick questionnaire    Fear of Current or Ex-Partner: No    Emotionally Abused: No    Physically Abused: No     Sexually Abused: No     OBSERVATIONS/OBJECTIVE:  BP (!) 146/84 (BP Location: Right Arm, Patient Position: Sitting)   Pulse 81   Temp 98.1 F (36.7 C) (Oral)   Resp 17   Wt 259 lb 9.6 oz (117.8 kg)   LMP 06/01/2011   SpO2 96%   BMI 40.66 kg/m  GENERAL: Patient is a well appearing female in no acute distress HEENT:  Sclerae anicteric.  Oropharynx clear and moist. No ulcerations or evidence of oropharyngeal candidiasis. Neck is supple.  NODES:  No cervical, supraclavicular, or axillary lymphadenopathy palpated.  BREAST EXAM: right breast  s/p lumpectomy and radiation changes, no sign of local recurrence; left breast benign LUNGS:  Clear to auscultation bilaterally.  No wheezes or rhonchi. HEART:  Regular rate and rhythm. No murmur appreciated. ABDOMEN:  Soft, nontender.  Positive, normoactive bowel sounds. No organomegaly palpated. MSK:  No focal spinal tenderness to palpation. Full range of motion bilaterally in the upper extremities. EXTREMITIES:  No peripheral edema.   SKIN:  Clear with no obvious rashes or skin changes. No nail dyscrasia. NEURO:  Nonfocal. Well oriented.  Appropriate affect.   LABORATORY DATA:  None for this visit.  DIAGNOSTIC IMAGING:  None for this visit.      ASSESSMENT AND PLAN:  Ms.. Breit is a pleasant 53 y.o. female with Stage IA right breast invasive ductal carcinoma, ER+/PR+/HER2-, diagnosed in 03/2023, treated with lumpectomy, adjuvant radiation therapy, and anti-estrogen therapy with Anastrozole beginning in 07/2023.  She presents to the Survivorship Clinic for our initial meeting and routine follow-up post-completion of treatment for breast cancer.    1. Stage IA right breast cancer:  Ms. Thurow is continuing to recover from definitive treatment for breast cancer. She will follow-up with her medical oncologist, Dr. Odean in 6 months with history and physical exam per surveillance protocol.  She will continue her anti-estrogen therapy with  Tamoxifen . Thus far, she is tolerating the Tamoxifen  well, with minimal side effects. Her mammogram is due 02/2024; orders placed today.  We discussed guardant reveal testing and the patient opted to proceed with this every 6 months.     Today, a comprehensive survivorship care plan and treatment summary was reviewed with the patient today detailing her breast cancer diagnosis, treatment course, potential late/long-term effects of treatment, appropriate follow-up care with recommendations for the future, and patient education resources.  A copy of this summary, along with a letter will be sent to the patient's primary care provider via mail/fax/In Basket message after today's visit.    2. Bone health:  She was given education on specific activities to promote bone health.  3. Cancer screening:  Due to Ms. Dorning's history and her age, she should receive screening for skin cancers, colon cancer, and gynecologic cancers.  The information and recommendations are listed on the patient's comprehensive care plan/treatment summary and were reviewed in detail with the patient.    4. Health maintenance and wellness promotion: Ms. Elms was encouraged to consume 5-7 servings of fruits and vegetables per day. We reviewed the Nutrition Rainbow handout.  She was also encouraged to engage in moderate to vigorous exercise for 30 minutes per day most days of the week.  She was instructed to limit her alcohol consumption and continue to abstain from tobacco use.     5. Support services/counseling: It is not uncommon for this period of the patient's cancer care trajectory to be one of many emotions and stressors.   She was given information regarding our available services and encouraged to contact me with any questions or for help enrolling in any of our support group/programs.    Follow up instructions:    -Return to cancer center 1 year (10/2024) -See Dr Ebbie in 04/2024  -Mammogram due in  02/2024 -Guardant Reveal testing every 6 months -She is welcome to return back to the Survivorship Clinic at any time; no additional follow-up needed at this time.  -Consider referral back to survivorship as a long-term survivor for continued surveillance  The patient was provided an opportunity to ask questions and all were answered. The patient agreed with  the plan and demonstrated an understanding of the instructions.   Total encounter time:40 minutes*in face-to-face visit time, chart review, lab review, care coordination, order entry, and documentation of the encounter time.    Morna Kendall, NP 10/26/23 9:35 AM Medical Oncology and Hematology [facility] [address] [place], [place] [address] Tel. [phone]    Fax. [phone]  *Total Encounter Time as defined by the Centers for Medicare and Medicaid Services includes, in addition to the face-to-face time of a patient visit (documented in the note above) non-face-to-face time: obtaining and reviewing outside history, ordering and reviewing medications, tests or procedures, care coordination (communications with other health care professionals or caregivers) and documentation in the medical record.

## 2023-11-04 ENCOUNTER — Telehealth: Payer: Self-pay

## 2023-11-04 NOTE — Telephone Encounter (Signed)
 Per md orders entered for Guardant Reveal and all supported documents faxed to [phone]. Faxed confirmation was received.

## 2023-11-16 ENCOUNTER — Encounter: Payer: Self-pay | Admitting: Adult Health

## 2023-11-16 ENCOUNTER — Other Ambulatory Visit: Payer: Self-pay

## 2023-11-16 DIAGNOSIS — Z170 Estrogen receptor positive status [ER+]: Secondary | ICD-10-CM

## 2023-11-18 ENCOUNTER — Ambulatory Visit: Attending: Adult Health | Admitting: Rehabilitation

## 2023-11-18 ENCOUNTER — Encounter: Payer: Self-pay | Admitting: *Deleted

## 2023-11-18 ENCOUNTER — Encounter: Payer: Self-pay | Admitting: Rehabilitation

## 2023-11-18 DIAGNOSIS — M25611 Stiffness of right shoulder, not elsewhere classified: Secondary | ICD-10-CM | POA: Diagnosis present

## 2023-11-18 DIAGNOSIS — Z170 Estrogen receptor positive status [ER+]: Secondary | ICD-10-CM | POA: Diagnosis present

## 2023-11-18 DIAGNOSIS — C50211 Malignant neoplasm of upper-inner quadrant of right female breast: Secondary | ICD-10-CM | POA: Diagnosis present

## 2023-11-18 DIAGNOSIS — I890 Lymphedema, not elsewhere classified: Secondary | ICD-10-CM | POA: Diagnosis present

## 2023-11-18 DIAGNOSIS — Z483 Aftercare following surgery for neoplasm: Secondary | ICD-10-CM | POA: Diagnosis present

## 2023-11-18 DIAGNOSIS — R293 Abnormal posture: Secondary | ICD-10-CM | POA: Diagnosis present

## 2023-11-18 NOTE — Therapy (Signed)
 OUTPATIENT PHYSICAL THERAPY  UPPER EXTREMITY ONCOLOGY EVALUATION  Patient Name: Darlene Patterson MRN: [id number] DOB:1970-11-24, 53 y.o., female Today's Date: 11/18/2023  END OF SESSION:  PT End of Session - 11/18/23 1147     Visit Number 1    Number of Visits 7    Date for PT Re-Evaluation 12/16/23    Authorization Type none    PT Start Time 1100    PT Stop Time 1147    PT Time Calculation (min) 47 min    Activity Tolerance Patient tolerated treatment well    Behavior During Therapy WFL for tasks assessed/performed          Past Medical History:  Diagnosis Date   Anxiety    with anesthesia   Cancer (HCC) 03/2023   right breast IDC   Complication of anesthesia    Great toe pain    cyst left great toe- to be excised 05-24-14- [place]   H/O hiatal hernia    per upper GI 3/13   Heart murmur    early teens- resolved   History of kidney stones    Obesity    Peripheral vascular disease (HCC)    varicose veins- left   PONV (postoperative nausea and vomiting)    'SEVERE PER PT   Status post gastric banding    Past Surgical History:  Procedure Laterality Date   ABDOMINAL HYSTERECTOMY  3/13   ACHILLES TENDON REPAIR  08, 09   bilateral   BREAST BIOPSY Right 03/12/2023   US  RT BREAST BX W LOC DEV 1ST LESION IMG BX SPEC US  GUIDE 03/12/2023 [facility]   BREAST BIOPSY  03/31/2023   MM RT RADIOACTIVE SEED LOC MAMMO GUIDE 03/31/2023 [facility]   BREAST LUMPECTOMY WITH RADIOACTIVE SEED AND SENTINEL LYMPH NODE BIOPSY Right 04/01/2023   Procedure: RIGHT BREAST SEED GUIDED LUMPECTOMY AND RIGHT AXILLARY SENTINEL NODE BIOPSY;  Surgeon: Darlene Cough, MD;  Location: [facility];  Service: General;  Laterality: Right;  pec block   BREATH TEK H PYLORI  05/21/2011   Procedure: BREATH TEK H PYLORI;  Surgeon: Patterson Darlene Lunger, MD;  Location: [facility];  Service: General;  Laterality: N/A;   CESAREAN SECTION  89, 93   CHOLECYSTECTOMY  2011   CYSTOSCOPY W/  URETERAL STENT PLACEMENT Left 05/16/2014   Procedure: CYSTOSCOPY WITH RETROGRADE PYELOGRAM/URETERAL STENT PLACEMENT;  Surgeon: Darlene LELON Salines, MD;  Location: [facility];  Service: Urology;  Laterality: Left;   CYSTOSCOPY WITH URETEROSCOPY AND STENT PLACEMENT Left 05/31/2014   Procedure: LEFT URETEROSCOPY AND STENT EXCHANGE, LEFT RETROGRADE;  Surgeon: Darlene LELON Salines, MD;  Location: [facility];  Service: Urology;  Laterality: Left;   EYE SURGERY  75,85   repair lazy eye   HIATAL HERNIA REPAIR  10/07/2011   Procedure: LAPAROSCOPIC REPAIR OF HIATAL HERNIA;  Surgeon: Patterson Darlene Lunger, MD;  Location: [facility];  Service: General;  Laterality: N/A;   LAPAROSCOPIC GASTRIC BANDING  10/07/2011   Procedure: LAPAROSCOPIC GASTRIC BANDING;  Surgeon: Patterson Darlene Lunger, MD;  Location: [facility];  Service: General;  Laterality: N/A;   VASCULAR SURGERY     left leg- s/p  injections of veins   Patient Active Problem List   Diagnosis Date Noted   Genetic testing 03/30/2023   Malignant neoplasm of upper-inner quadrant of right breast in female, estrogen receptor positive (HCC) 03/17/2023   Lapband APS + [facility] repair July 2013 10/08/2011   Obesity BMI 41 05/01/2011    REFERRING PROVIDER: Morna Kendall, NP  REFERRING DIAG:  Diagnosis  C50.211,Z17.0 (ICD-10-CM) - Malignant neoplasm of upper-inner quadrant of right breast in female, estrogen receptor positive (HCC)    THERAPY DIAG:  Aftercare following surgery for neoplasm  Malignant neoplasm of upper-inner quadrant of right breast in female, estrogen receptor positive (HCC)  Lymphedema of breast  ONSET DATE: 04/01/23  Rationale for Evaluation and Treatment: Rehabilitation  SUBJECTIVE:                                                                                                                                                                                           SUBJECTIVE STATEMENT: At times the breast feels swollen and heavy and its hard underneath.   Wearing compression.    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 She underwent a right lumpectomy and sentinel node biopsy on 04/01/2023 with 1 of 2 lymph nodes positive for carcinoma. It is ER/PR positive and HER2 negative with a Ki67 of 5%. Completed radiation.   PAIN:  Are you having pain? No Occasional twinges.    PRECAUTIONS: Lymphedema risk - currently doing SOZO  RED FLAGS: None   WEIGHT BEARING RESTRICTIONS: No  FALLS:  Has patient fallen in last 6 months? No  LIVING ENVIRONMENT: Lives with: lives with spouse and partner Darlene Patterson Lives in: House/apartment  OCCUPATION: full time [employer]  LEISURE: arm stretches, biking and walking work   HAND DOMINANCE: right   PRIOR LEVEL OF FUNCTION: Independent  PATIENT GOALS: learn what to do for the breast swelling     OBJECTIVE: Note: Objective measures were completed at Evaluation unless otherwise noted.  COGNITION: Overall cognitive status: Within functional limits for tasks assessed   PALPATION: Medial and inferior breast fibrosis, no pitting  OBSERVATIONS / OTHER ASSESSMENTS: enlarged pores medial breast  UPPER EXTREMITY AROM/PROM: Still have good ROM from before.   UPPER EXTREMITY STRENGTH: WNL  BREAST COMPLAINTS QUESTIONNAIRE    11/18/23 Pain:   2 Heaviness:  3 Swollen feeling: 1 Tense Skin:  1 Redness:  0 Bra Print:  0 Size of Pores:  0 Hard feeling:   7 Total:   14  /80 A Score over 9 indicates lymphedema issues in the breast  TREATMENT DATE:  11/18/23 With pt permission for breast MLD Education on lymphatic anatomy and drainage patterns of the breast and principles of MLD Performed each step in supine per instruction section below with PT reading and then performing each step and then with hand over hand cueing and performance by  pt with modification as needed - noted below. Performed extra MLD after instruction  Made pt foam chip pack for use in fibrotic breast locations with instruction on using 30min-1 hour to start and then as needed Encouraged compression bra continued use.  PATIENT EDUCATION:  Education details: per today's note Person educated: Patient and Spouse Education method: Explanation, Demonstration, Actor cues, Verbal cues, and Handouts Education comprehension: verbalized understanding, returned demonstration, verbal cues required, tactile cues required, and needs further education  HOME EXERCISE PROGRAM: Start self MLD, use compression and foam  ASSESSMENT:  CLINICAL IMPRESSION: Patient is a 53 y.o. female who was seen today for physical therapy evaluation and treatment for her new onset Rt breast lymphedema after doing some offroading in a jeep. She is having no pain with it but intermittent swelling and firmness.  She has enlarged pores on the medial breast and fibrosis medially and inferiorly.  Her ROM is still WNL at this point.  She was taught breast MLD today with handout and pt reported feeling better already after initial session.  She will benefit from PT 2x per week x 3 weeks to improve edema in the breast.    OBJECTIVE IMPAIRMENTS: decreased knowledge of condition, decreased knowledge of use of DME, increased edema, and increased fascial restrictions.   ACTIVITY LIMITATIONS: sleeping  PARTICIPATION LIMITATIONS: recreational activities   PERSONAL FACTORS: 1-2 comorbidities: SLNB, radiation to the axilla  are also affecting patient's functional outcome.   REHAB POTENTIAL: Excellent  CLINICAL DECISION MAKING: Stable/uncomplicated  EVALUATION COMPLEXITY: Low  GOALS: Goals reviewed with patient? Yes  SHORT TERM GOALS: Target date: 12/16/23  Pt will be ind with self MLD for the Rt breast to decrease risk of infection Baseline: Goal status: INITIAL  2.  Pt will decrease breast  edema questionnaire to 9/10 or less to demonstrate improved breast status  Baseline:  Goal status: INITIAL   PLAN:  PT FREQUENCY: 2x/week  PT DURATION: 4 weeks  PLANNED INTERVENTIONS: 97164- PT Re-evaluation, 97110-Therapeutic exercises, 97535- Self Care, [id number]- Manual therapy, Patient/Family education, Balance training, Joint mobilization, Therapeutic exercises, Therapeutic activity, Neuromuscular re-education, and Self Care  PLAN FOR NEXT SESSION: Left breast MLD, review self MLD  Larue Saddie SAUNDERS, PT 11/18/2023, 11:48 AM

## 2023-11-18 NOTE — Progress Notes (Signed)
 Received message from [facility] team stating patient was non responsive to mobile phlebotomy team.  Testing will be canceled at this time.  If pt wishes to proceed in the future, orders will be placed.

## 2023-11-24 ENCOUNTER — Ambulatory Visit: Admitting: Rehabilitation

## 2023-11-24 ENCOUNTER — Encounter: Payer: Self-pay | Admitting: Rehabilitation

## 2023-11-24 DIAGNOSIS — M25611 Stiffness of right shoulder, not elsewhere classified: Secondary | ICD-10-CM

## 2023-11-24 DIAGNOSIS — R293 Abnormal posture: Secondary | ICD-10-CM

## 2023-11-24 DIAGNOSIS — Z483 Aftercare following surgery for neoplasm: Secondary | ICD-10-CM | POA: Diagnosis not present

## 2023-11-24 DIAGNOSIS — I890 Lymphedema, not elsewhere classified: Secondary | ICD-10-CM

## 2023-11-24 DIAGNOSIS — Z170 Estrogen receptor positive status [ER+]: Secondary | ICD-10-CM

## 2023-11-24 NOTE — Therapy (Signed)
 OUTPATIENT PHYSICAL THERAPY  UPPER EXTREMITY ONCOLOGY EVALUATION  Patient Name: Darlene Patterson MRN: [id number] DOB:04-10-1970, 53 y.o., female Today's Date: 11/24/2023  END OF SESSION:  PT End of Session - 11/24/23 1236     Visit Number 2    Number of Visits 7    Date for PT Re-Evaluation 12/16/23    PT Start Time 1155    PT Stop Time 1235    PT Time Calculation (min) 40 min    Activity Tolerance Patient tolerated treatment well    Behavior During Therapy [facility] for tasks assessed/performed           Past Medical History:  Diagnosis Date   Anxiety    with anesthesia   Cancer (HCC) 03/2023   right breast IDC   Complication of anesthesia    Great toe pain    cyst left great toe- to be excised 05-24-14- [place]   H/O hiatal hernia    per upper GI 3/13   Heart murmur    early teens- resolved   History of kidney stones    Obesity    Peripheral vascular disease (HCC)    varicose veins- left   PONV (postoperative nausea and vomiting)    'SEVERE PER PT   Status post gastric banding    Past Surgical History:  Procedure Laterality Date   ABDOMINAL HYSTERECTOMY  3/13   ACHILLES TENDON REPAIR  08, 09   bilateral   BREAST BIOPSY Right 03/12/2023   US  RT BREAST BX W LOC DEV 1ST LESION IMG BX SPEC US  GUIDE 03/12/2023 [facility]   BREAST BIOPSY  03/31/2023   MM RT RADIOACTIVE SEED LOC MAMMO GUIDE 03/31/2023 [facility]   BREAST LUMPECTOMY WITH RADIOACTIVE SEED AND SENTINEL LYMPH NODE BIOPSY Right 04/01/2023   Procedure: RIGHT BREAST SEED GUIDED LUMPECTOMY AND RIGHT AXILLARY SENTINEL NODE BIOPSY;  Surgeon: Ebbie Cough, MD;  Location: [facility];  Service: General;  Laterality: Right;  pec block   BREATH TEK H PYLORI  05/21/2011   Procedure: BREATH TEK H PYLORI;  Surgeon: Cough KATHEE Lunger, MD;  Location: [facility];  Service: General;  Laterality: N/A;   CESAREAN SECTION  89, 93   CHOLECYSTECTOMY  2011   CYSTOSCOPY W/ URETERAL STENT PLACEMENT  Left 05/16/2014   Procedure: CYSTOSCOPY WITH RETROGRADE PYELOGRAM/URETERAL STENT PLACEMENT;  Surgeon: Morene LELON Salines, MD;  Location: [facility];  Service: Urology;  Laterality: Left;   CYSTOSCOPY WITH URETEROSCOPY AND STENT PLACEMENT Left 05/31/2014   Procedure: LEFT URETEROSCOPY AND STENT EXCHANGE, LEFT RETROGRADE;  Surgeon: Morene LELON Salines, MD;  Location: [facility];  Service: Urology;  Laterality: Left;   EYE SURGERY  75,85   repair lazy eye   HIATAL HERNIA REPAIR  10/07/2011   Procedure: LAPAROSCOPIC REPAIR OF HIATAL HERNIA;  Surgeon: Cough KATHEE Lunger, MD;  Location: [facility];  Service: General;  Laterality: N/A;   LAPAROSCOPIC GASTRIC BANDING  10/07/2011   Procedure: LAPAROSCOPIC GASTRIC BANDING;  Surgeon: Cough KATHEE Lunger, MD;  Location: [facility];  Service: General;  Laterality: N/A;   VASCULAR SURGERY     left leg- s/p  injections of veins   Patient Active Problem List   Diagnosis Date Noted   Genetic testing 03/30/2023   Malignant neoplasm of upper-inner quadrant of right breast in female, estrogen receptor positive (HCC) 03/17/2023   Lapband APS + [facility] repair July 2013 10/08/2011   Obesity BMI 41 05/01/2011    REFERRING PROVIDER: Morna Kendall, NP  REFERRING DIAG:  Diagnosis  C50.211,Z17.0 (ICD-10-CM) - Malignant neoplasm of upper-inner quadrant of right breast in female, estrogen receptor positive (HCC)    THERAPY DIAG:  Aftercare following surgery for neoplasm  Malignant neoplasm of upper-inner quadrant of right breast in female, estrogen receptor positive (HCC)  Lymphedema of breast  Stiffness of right shoulder, not elsewhere classified  Abnormal posture  ONSET DATE: 04/01/23  Rationale for Evaluation and Treatment: Rehabilitation  SUBJECTIVE:                                                                                                                                                                                           SUBJECTIVE STATEMENT: It feels better.  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 She underwent a right lumpectomy and sentinel node biopsy on 04/01/2023 with 1 of 2 lymph nodes positive for carcinoma. It is ER/PR positive and HER2 negative with a Ki67 of 5%. Completed radiation.   PAIN:  Are you having pain? No  PRECAUTIONS: Lymphedema risk - currently doing SOZO  RED FLAGS: None   WEIGHT BEARING RESTRICTIONS: No  FALLS:  Has patient fallen in last 6 months? No  LIVING ENVIRONMENT: Lives with: lives with spouse and partner [name] Lives in: House/apartment  OCCUPATION: full time [employer]  LEISURE: arm stretches, biking and walking work   HAND DOMINANCE: right   PRIOR LEVEL OF FUNCTION: Independent  PATIENT GOALS: learn what to do for the breast swelling     OBJECTIVE: Note: Objective measures were completed at Evaluation unless otherwise noted.  COGNITION: Overall cognitive status: Within functional limits for tasks assessed   PALPATION: Medial and inferior breast fibrosis, no pitting  OBSERVATIONS / OTHER ASSESSMENTS: enlarged pores medial breast  UPPER EXTREMITY AROM/PROM: Still have good ROM from before.   UPPER EXTREMITY STRENGTH: WNL  BREAST COMPLAINTS QUESTIONNAIRE    11/18/23  11/24/23 Pain:   2  0   Heaviness:  3  3 Swollen feeling: 1  3 Tense Skin:  1  0 Redness:  0  0 Bra Print:  0  0 Size of Pores:  0  0 Hard feeling:   7  0 Total:      14  /80  6/80 A Score over 9 indicates lymphedema issues in the breast  TREATMENT DATE:  11/24/23 In supine: Short neck, 5 diaphragmatic breaths, bil axillary nodes and establishment of interaxillary pathway, R inguinal nodes and establishment of axilloinguinal pathway, then R breast moving fluid towards pathways spending extra time in any  areas of fibrosis then retracing all steps.   11/18/23 With pt permission for breast MLD Education on lymphatic anatomy and drainage patterns of the breast and principles of MLD Performed each step in supine per instruction section below with PT reading and then performing each step and then with hand over hand cueing and performance by pt with modification as needed - noted below. Performed extra MLD after instruction  Made pt foam chip pack for use in fibrotic breast locations with instruction on using 30min-1 hour to start and then as needed Encouraged compression bra continued use.  PATIENT EDUCATION:  Education details: per today's note Person educated: Patient and Spouse Education method: Explanation, Demonstration, Tactile cues, Verbal cues, and Handouts Education comprehension: verbalized understanding, returned demonstration, verbal cues required, tactile cues required, and needs further education  HOME EXERCISE PROGRAM: Start self MLD, use compression and foam  ASSESSMENT:  CLINICAL IMPRESSION: Pt has already met her breast complaints goal of 9 with a score of 6.  She is feeling improvements in the hardness of the breast and the discomfort in the axilla.  She is doing her self MLD with a massage tool at home which seems to be working well.    OBJECTIVE IMPAIRMENTS: decreased knowledge of condition, decreased knowledge of use of DME, increased edema, and increased fascial restrictions.   ACTIVITY LIMITATIONS: sleeping  PARTICIPATION LIMITATIONS: recreational activities   PERSONAL FACTORS: 1-2 comorbidities: SLNB, radiation to the axilla  are also affecting patient's functional outcome.   REHAB POTENTIAL: Excellent  CLINICAL DECISION MAKING: Stable/uncomplicated  EVALUATION COMPLEXITY: Low  GOALS: Goals reviewed with patient? Yes  SHORT TERM GOALS: Target date: 12/16/23  Pt will be ind with self MLD for the Rt breast to decrease risk of infection Baseline: Goal  status: INITIAL  2.  Pt will decrease breast edema questionnaire to 9/10 or less to demonstrate improved breast status  Baseline:  Goal status: MET   PLAN:  PT FREQUENCY: 2x/week  PT DURATION: 4 weeks  PLANNED INTERVENTIONS: 97164- PT Re-evaluation, 97110-Therapeutic exercises, 97535- Self Care, [id number]- Manual therapy, Patient/Family education, Balance training, Joint mobilization, Therapeutic exercises, Therapeutic activity, Neuromuscular re-education, and Self Care  PLAN FOR NEXT SESSION: Left breast MLD, review self MLD  Larue Saddie SAUNDERS, PT 11/24/2023, 12:37 PM

## 2023-11-26 ENCOUNTER — Ambulatory Visit

## 2023-11-26 DIAGNOSIS — Z483 Aftercare following surgery for neoplasm: Secondary | ICD-10-CM | POA: Diagnosis not present

## 2023-11-26 DIAGNOSIS — C50211 Malignant neoplasm of upper-inner quadrant of right female breast: Secondary | ICD-10-CM

## 2023-11-26 DIAGNOSIS — R293 Abnormal posture: Secondary | ICD-10-CM

## 2023-11-26 DIAGNOSIS — I890 Lymphedema, not elsewhere classified: Secondary | ICD-10-CM

## 2023-11-26 DIAGNOSIS — M25611 Stiffness of right shoulder, not elsewhere classified: Secondary | ICD-10-CM

## 2023-11-26 NOTE — Therapy (Signed)
 OUTPATIENT PHYSICAL THERAPY  UPPER EXTREMITY ONCOLOGY TREATMENT  Patient Name: Darlene Patterson MRN: [id number] DOB:11/27/1970, 53 y.o., female Today's Date: 11/26/2023  END OF SESSION:  PT End of Session - 11/26/23 1202     Visit Number 3    Number of Visits 7    Date for Recertification  12/16/23    Authorization Type none    PT Start Time 1159    PT Stop Time 1255    PT Time Calculation (min) 56 min    Activity Tolerance Patient tolerated treatment well    Behavior During Therapy WFL for tasks assessed/performed           Past Medical History:  Diagnosis Date   Anxiety    with anesthesia   Cancer (HCC) 03/2023   right breast IDC   Complication of anesthesia    Great toe pain    cyst left great toe- to be excised 05-24-14- [place]   H/O hiatal hernia    per upper GI 3/13   Heart murmur    early teens- resolved   History of kidney stones    Obesity    Peripheral vascular disease (HCC)    varicose veins- left   PONV (postoperative nausea and vomiting)    'SEVERE PER PT   Status post gastric banding    Past Surgical History:  Procedure Laterality Date   ABDOMINAL HYSTERECTOMY  3/13   ACHILLES TENDON REPAIR  08, 09   bilateral   BREAST BIOPSY Right 03/12/2023   US  RT BREAST BX W LOC DEV 1ST LESION IMG BX SPEC US  GUIDE 03/12/2023 [facility]   BREAST BIOPSY  03/31/2023   MM RT RADIOACTIVE SEED LOC MAMMO GUIDE 03/31/2023 [facility]   BREAST LUMPECTOMY WITH RADIOACTIVE SEED AND SENTINEL LYMPH NODE BIOPSY Right 04/01/2023   Procedure: RIGHT BREAST SEED GUIDED LUMPECTOMY AND RIGHT AXILLARY SENTINEL NODE BIOPSY;  Surgeon: Ebbie Cough, MD;  Location: [facility];  Service: General;  Laterality: Right;  pec block   BREATH TEK H PYLORI  05/21/2011   Procedure: BREATH TEK H PYLORI;  Surgeon: Cough KATHEE Lunger, MD;  Location: [facility];  Service: General;  Laterality: N/A;   CESAREAN SECTION  89, 93   CHOLECYSTECTOMY  2011   CYSTOSCOPY  W/ URETERAL STENT PLACEMENT Left 05/16/2014   Procedure: CYSTOSCOPY WITH RETROGRADE PYELOGRAM/URETERAL STENT PLACEMENT;  Surgeon: Morene LELON Salines, MD;  Location: [facility];  Service: Urology;  Laterality: Left;   CYSTOSCOPY WITH URETEROSCOPY AND STENT PLACEMENT Left 05/31/2014   Procedure: LEFT URETEROSCOPY AND STENT EXCHANGE, LEFT RETROGRADE;  Surgeon: Morene LELON Salines, MD;  Location: [facility];  Service: Urology;  Laterality: Left;   EYE SURGERY  75,85   repair lazy eye   HIATAL HERNIA REPAIR  10/07/2011   Procedure: LAPAROSCOPIC REPAIR OF HIATAL HERNIA;  Surgeon: Cough KATHEE Lunger, MD;  Location: [facility];  Service: General;  Laterality: N/A;   LAPAROSCOPIC GASTRIC BANDING  10/07/2011   Procedure: LAPAROSCOPIC GASTRIC BANDING;  Surgeon: Cough KATHEE Lunger, MD;  Location: [facility];  Service: General;  Laterality: N/A;   VASCULAR SURGERY     left leg- s/p  injections of veins   Patient Active Problem List   Diagnosis Date Noted   Genetic testing 03/30/2023   Malignant neoplasm of upper-inner quadrant of right breast in female, estrogen receptor positive (HCC) 03/17/2023   Lapband APS + [facility] repair July 2013 10/08/2011   Obesity BMI 41 05/01/2011    REFERRING PROVIDER: Morna Kendall,  NP  REFERRING DIAG:  Diagnosis  C50.211,Z17.0 (ICD-10-CM) - Malignant neoplasm of upper-inner quadrant of right breast in female, estrogen receptor positive (HCC)    THERAPY DIAG:  Aftercare following surgery for neoplasm  Malignant neoplasm of upper-inner quadrant of right breast in female, estrogen receptor positive (HCC)  Lymphedema of breast  Stiffness of right shoulder, not elsewhere classified  Abnormal posture  ONSET DATE: 04/01/23  Rationale for Evaluation and Treatment: Rehabilitation  SUBJECTIVE:                                                                                                                                                                                           SUBJECTIVE  STATEMENT: I am doing really good and thinking to make today my last visit. Saddie really worked it out good last time and I've been doing the self MLD more.   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 She underwent a right lumpectomy and sentinel node biopsy on 04/01/2023 with 1 of 2 lymph nodes positive for carcinoma. It is ER/PR positive and HER2 negative with a Ki67 of 5%. Completed radiation.   PAIN:  Are you having pain? No  PRECAUTIONS: Lymphedema risk - currently doing SOZO  RED FLAGS: None   WEIGHT BEARING RESTRICTIONS: No  FALLS:  Has patient fallen in last 6 months? No  LIVING ENVIRONMENT: Lives with: lives with spouse and partner [name] Lives in: House/apartment  OCCUPATION: full time [employer]  LEISURE: arm stretches, biking and walking work   HAND DOMINANCE: right   PRIOR LEVEL OF FUNCTION: Independent  PATIENT GOALS: learn what to do for the breast swelling     OBJECTIVE: Note: Objective measures were completed at Evaluation unless otherwise noted.  COGNITION: Overall cognitive status: Within functional limits for tasks assessed   PALPATION: Medial and inferior breast fibrosis, no pitting  OBSERVATIONS / OTHER ASSESSMENTS: enlarged pores medial breast  UPPER EXTREMITY AROM/PROM: Still have good ROM from before.   UPPER EXTREMITY STRENGTH: WNL  BREAST COMPLAINTS QUESTIONNAIRE    11/18/23  11/24/23 Pain:   2  0   Heaviness:  3  3 Swollen feeling: 1  3 Tense Skin:  1  0 Redness:  0  0 Bra Print:  0  0 Size of Pores:  0  0 Hard feeling:   7  0 Total:      14  /80  6/80 A Score over 9 indicates lymphedema issues in the breast  TREATMENT DATE:  11/26/23: Manual Therapy MLD In supine to Rt breast: Short neck, superficial and deep abdominals, Lt axillary and pectoral nodes, intact  anterior thorax sequence and establishment of anterior inter-axillary pathway, Rt inguinal nodes and establishment of Rt axillo-inguinal pathway, then Rt breast moving fluid towards pathways spending extra time in any areas of fibrosis then into Lt S/L for focus to lateral breast redirecting towards lateral anastomosis, then finished retracing all steps in supine.  Self Care Educated pt about a swell spot and she would like to be set up to order this thru [employer] so measured pt and set her up for a size medium swell spot to include lateral trunk so pt can order this at her convenience.   11/24/23 In supine: Short neck, 5 diaphragmatic breaths, bil axillary nodes and establishment of interaxillary pathway, R inguinal nodes and establishment of axilloinguinal pathway, then R breast moving fluid towards pathways spending extra time in any areas of fibrosis then retracing all steps.   11/18/23 With pt permission for breast MLD Education on lymphatic anatomy and drainage patterns of the breast and principles of MLD Performed each step in supine per instruction section below with PT reading and then performing each step and then with hand over hand cueing and performance by pt with modification as needed - noted below. Performed extra MLD after instruction  Made pt foam chip pack for use in fibrotic breast locations with instruction on using 30min-1 hour to start and then as needed Encouraged compression bra continued use.  PATIENT EDUCATION:  Education details: per today's note Person educated: Patient and Spouse Education method: Explanation, Demonstration, Tactile cues, Verbal cues, and Handouts Education comprehension: verbalized understanding, returned demonstration, verbal cues required, tactile cues required, and needs further education  HOME EXERCISE PROGRAM: Start self MLD, use compression and foam  ASSESSMENT:  CLINICAL IMPRESSION: Pt comes in reporting doing very well and would like to  make today her last visit. Continued with MLD to Rt breast and then set pt up for an order for swell spot thru [employer].   OBJECTIVE IMPAIRMENTS: decreased knowledge of condition, decreased knowledge of use of DME, increased edema, and increased fascial restrictions.   ACTIVITY LIMITATIONS: sleeping  PARTICIPATION LIMITATIONS: recreational activities   PERSONAL FACTORS: 1-2 comorbidities: SLNB, radiation to the axilla  are also affecting patient's functional outcome.   REHAB POTENTIAL: Excellent  CLINICAL DECISION MAKING: Stable/uncomplicated  EVALUATION COMPLEXITY: Low  GOALS: Goals reviewed with patient? Yes  SHORT TERM GOALS: Target date: 12/16/23  Pt will be ind with self MLD for the Rt breast to decrease risk of infection Baseline: Goal status: MET  2.  Pt will decrease breast edema questionnaire to 9/10 or less to demonstrate improved breast status  Baseline:  Goal status: MET   PLAN:  PT FREQUENCY: 2x/week  PT DURATION: 4 weeks  PLANNED INTERVENTIONS: 97164- PT Re-evaluation, 97110-Therapeutic exercises, 97535- Self Care, [id number]- Manual therapy, Patient/Family education, Balance training, Joint mobilization, Therapeutic exercises, Therapeutic activity, Neuromuscular re-education, and Self Care  PLAN FOR NEXT SESSION: D/C this visit. Pt to cont every 3 month SOZO screens.   Aden Berwyn Caldron, PTA 11/26/2023, 1:11 PM

## 2023-11-30 ENCOUNTER — Ambulatory Visit

## 2023-12-02 ENCOUNTER — Ambulatory Visit: Admitting: Rehabilitation

## 2023-12-08 ENCOUNTER — Encounter: Admitting: Rehabilitation

## 2023-12-10 ENCOUNTER — Encounter: Payer: Self-pay | Admitting: Hematology and Oncology

## 2023-12-10 ENCOUNTER — Encounter: Admitting: Rehabilitation

## 2024-01-04 ENCOUNTER — Ambulatory Visit: Attending: General Surgery

## 2024-01-04 VITALS — Wt 252.2 lb

## 2024-01-04 DIAGNOSIS — Z483 Aftercare following surgery for neoplasm: Secondary | ICD-10-CM | POA: Insufficient documentation

## 2024-01-04 NOTE — Therapy (Signed)
 OUTPATIENT PHYSICAL THERAPY SOZO SCREENING NOTE   Patient Name: Darlene Patterson MRN: [id number] DOB:04/07/1970, 53 y.o., female Today's Date: 01/04/2024  PCP: Conley Dene BROCKS, DO REFERRING PROVIDER: Ebbie Cough, MD   PT End of Session - 01/04/24 1505     Visit Number 3   # unchanged due to screen only   PT Start Time 1503    PT Stop Time 1507    PT Time Calculation (min) 4 min    Activity Tolerance Patient tolerated treatment well    Behavior During Therapy [facility] for tasks assessed/performed          Past Medical History:  Diagnosis Date   Anxiety    with anesthesia   Cancer (HCC) 03/2023   right breast IDC   Complication of anesthesia    Great toe pain    cyst left great toe- to be excised 05-24-14- [place]   H/O hiatal hernia    per upper GI 3/13   Heart murmur    early teens- resolved   History of kidney stones    Obesity    Peripheral vascular disease    varicose veins- left   PONV (postoperative nausea and vomiting)    'SEVERE PER PT   Status post gastric banding    Past Surgical History:  Procedure Laterality Date   ABDOMINAL HYSTERECTOMY  3/13   ACHILLES TENDON REPAIR  08, 09   bilateral   BREAST BIOPSY Right 03/12/2023   US  RT BREAST BX W LOC DEV 1ST LESION IMG BX SPEC US  GUIDE 03/12/2023 [facility]   BREAST BIOPSY  03/31/2023   MM RT RADIOACTIVE SEED LOC MAMMO GUIDE 03/31/2023 [facility]   BREAST LUMPECTOMY WITH RADIOACTIVE SEED AND SENTINEL LYMPH NODE BIOPSY Right 04/01/2023   Procedure: RIGHT BREAST SEED GUIDED LUMPECTOMY AND RIGHT AXILLARY SENTINEL NODE BIOPSY;  Surgeon: Ebbie Cough, MD;  Location:  [facility];  Service: General;  Laterality: Right;  pec block   BREATH TEK H PYLORI  05/21/2011   Procedure: BREATH TEK H PYLORI;  Surgeon: Cough KATHEE Lunger, MD;  Location: [facility];  Service: General;  Laterality: N/A;   CESAREAN SECTION  89, 93   CHOLECYSTECTOMY  2011   CYSTOSCOPY W/ URETERAL STENT  PLACEMENT Left 05/16/2014   Procedure: CYSTOSCOPY WITH RETROGRADE PYELOGRAM/URETERAL STENT PLACEMENT;  Surgeon: Morene LELON Salines, MD;  Location: [facility];  Service: Urology;  Laterality: Left;   CYSTOSCOPY WITH URETEROSCOPY AND STENT PLACEMENT Left 05/31/2014   Procedure: LEFT URETEROSCOPY AND STENT EXCHANGE, LEFT RETROGRADE;  Surgeon: Morene LELON Salines, MD;  Location: [facility];  Service: Urology;  Laterality: Left;   EYE SURGERY  75,85   repair lazy eye   HIATAL HERNIA REPAIR  10/07/2011   Procedure: LAPAROSCOPIC REPAIR OF HIATAL HERNIA;  Surgeon: Cough KATHEE Lunger, MD;  Location: [facility];  Service: General;  Laterality: N/A;   LAPAROSCOPIC GASTRIC BANDING  10/07/2011   Procedure: LAPAROSCOPIC GASTRIC BANDING;  Surgeon: Cough KATHEE Lunger, MD;  Location: [facility];  Service: General;  Laterality: N/A;   VASCULAR SURGERY     left leg- s/p  injections of veins   Patient Active Problem List   Diagnosis Date Noted   Genetic testing 03/30/2023   Malignant neoplasm of upper-inner quadrant of right breast in female, estrogen receptor positive (HCC) 03/17/2023   Lapband APS + [facility] repair July 2013 10/08/2011   Obesity BMI 41 05/01/2011    REFERRING DIAG: right breast cancer at risk for lymphedema  THERAPY DIAG: Aftercare  following surgery for neoplasm  PERTINENT HISTORY: Patient was diagnosed on 02/13/2023 with 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 She underwent a right lumpectomy and sentinel node biopsy on 04/01/2023 with 1 of 2 lymph nodes positive for carcinoma. It is ER/PR positive and HER2 negative with a Ki67 of 5%. Completed radiation.   PRECAUTIONS: right UE Lymphedema risk, None  SUBJECTIVE: Pt returns for her 3 month L-Dex screen.   PAIN:  Are you having pain? No  SOZO SCREENING: Patient was assessed today using the SOZO machine to determine the lymphedema index score. This was compared to her baseline score. It was determined that she is within the recommended range when compared to her  baseline and no further action is needed at this time. She will continue SOZO screenings. These are done every 3 months for 2 years post operatively followed by every 6 months for 2 years, and then annually.   L-DEX FLOWSHEETS - 01/04/24 1500       L-DEX LYMPHEDEMA SCREENING   Measurement Type Unilateral    L-DEX MEASUREMENT EXTREMITY Upper Extremity    POSITION  Standing    DOMINANT SIDE Right    At Risk Side Right    BASELINE SCORE (UNILATERAL) -1.5    L-DEX SCORE (UNILATERAL) 1.4    VALUE CHANGE (UNILAT) 2.9            Aden Berwyn Caldron, PTA 01/04/2024, 3:06 PM

## 2024-02-11 ENCOUNTER — Other Ambulatory Visit: Payer: Self-pay | Admitting: Medical Genetics

## 2024-02-12 ENCOUNTER — Other Ambulatory Visit (HOSPITAL_COMMUNITY)
Admission: RE | Admit: 2024-02-12 | Discharge: 2024-02-12 | Payer: Self-pay | Attending: Medical Genetics | Admitting: Medical Genetics

## 2024-02-16 ENCOUNTER — Ambulatory Visit
Admission: RE | Admit: 2024-02-16 | Discharge: 2024-02-16 | Disposition: A | Source: Ambulatory Visit | Attending: Adult Health | Admitting: Adult Health

## 2024-02-16 DIAGNOSIS — Z170 Estrogen receptor positive status [ER+]: Secondary | ICD-10-CM

## 2024-02-26 LAB — GENECONNECT MOLECULAR SCREEN: Genetic Analysis Overall Interpretation: NEGATIVE

## 2024-04-04 ENCOUNTER — Ambulatory Visit

## 2024-04-11 ENCOUNTER — Ambulatory Visit

## 2024-10-24 ENCOUNTER — Ambulatory Visit: Admitting: Hematology and Oncology
